# Patient Record
Sex: Male | Born: 1970
Health system: Southern US, Community
[De-identification: ages and names within clinical notes are randomized; demographics above are authoritative.]

## PROBLEM LIST (undated history)

## (undated) DIAGNOSIS — I10 Essential (primary) hypertension: Secondary | ICD-10-CM

## (undated) DIAGNOSIS — K219 Gastro-esophageal reflux disease without esophagitis: Secondary | ICD-10-CM

## (undated) DIAGNOSIS — F329 Major depressive disorder, single episode, unspecified: Secondary | ICD-10-CM

## (undated) DIAGNOSIS — Z87442 Personal history of urinary calculi: Secondary | ICD-10-CM

## (undated) DIAGNOSIS — F32A Depression, unspecified: Secondary | ICD-10-CM

## (undated) DIAGNOSIS — E559 Vitamin D deficiency, unspecified: Secondary | ICD-10-CM

## (undated) HISTORY — DX: Essential (primary) hypertension: I10

## (undated) HISTORY — DX: Depression, unspecified: F32.A

## (undated) HISTORY — DX: Personal history of urinary calculi: Z87.442

## (undated) HISTORY — DX: Vitamin D deficiency, unspecified: E55.9

---
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
## 2010-09-10 ENCOUNTER — Encounter: Payer: Self-pay | Admitting: Nurse Practitioner

## 2013-03-27 ENCOUNTER — Encounter (INDEPENDENT_AMBULATORY_CARE_PROVIDER_SITE_OTHER): Payer: Self-pay

## 2013-03-27 ENCOUNTER — Encounter: Payer: Self-pay | Admitting: Family Medicine

## 2013-03-27 ENCOUNTER — Ambulatory Visit (INDEPENDENT_AMBULATORY_CARE_PROVIDER_SITE_OTHER): Payer: PRIVATE HEALTH INSURANCE | Admitting: Family Medicine

## 2013-03-27 VITALS — BP 138/81 | HR 79 | Temp 98.7°F | Ht 71.0 in | Wt 256.2 lb

## 2013-03-27 DIAGNOSIS — J029 Acute pharyngitis, unspecified: Secondary | ICD-10-CM

## 2013-03-27 DIAGNOSIS — R509 Fever, unspecified: Secondary | ICD-10-CM

## 2013-03-27 DIAGNOSIS — R05 Cough: Secondary | ICD-10-CM

## 2013-03-27 LAB — POCT INFLUENZA A/B
Influenza A, POC: NEGATIVE
Influenza B, POC: NEGATIVE

## 2013-03-27 LAB — POCT RAPID STREP A (OFFICE): Rapid Strep A Screen: NEGATIVE

## 2013-03-27 MED ORDER — AZITHROMYCIN 250 MG PO TABS: ORAL_TABLET | ORAL | Status: DC

## 2013-03-27 MED ORDER — METHYLPREDNISOLONE ACETATE 80 MG/ML IJ SUSP
80.0000 mg | Freq: Once | INTRAMUSCULAR | Status: AC
  Administered 2013-03-27: 80 mg via INTRAMUSCULAR

## 2013-03-27 NOTE — Progress Notes (Signed)
  Subjective:    Patient ID: Eric Weaver, male    DOB: 03/20/1971, 42 y.o.   MRN: [id number]  HPI This 42 y.o. male presents for evaluation of fever, cough, and uri sx's for over a week.   Review of Systems C/o uri, cough, and fatigue No chest pain, SOB, HA, dizziness, vision change, N/V, diarrhea, constipation, dysuria, urinary urgency or frequency, myalgias, arthralgias or rash.     Objective:   Physical Exam Vital signs noted  Well developed well nourished male.  HEENT - Head atraumatic Normocephalic                Eyes - PERRLA, Conjuctiva - clear Sclera- Clear EOMI                Ears - EAC's Wnl TM's Wnl Gross Hearing WNL                Nose - Nares patent                 Throat - oropharanx wnl Respiratory - Lungs CTA bilateral Cardiac - RRR S1 and S2 without murmur GI - Abdomen soft Nontender and bowel sounds active x 4 Extremities - No edema. Neuro - Grossly intact.  Results for orders placed in visit on 03/27/13  POCT RAPID STREP A (OFFICE)      Result Value Range   Rapid Strep A Screen Negative  Negative  POCT INFLUENZA A/B      Result Value Range   Influenza A, POC Negative     Influenza B, POC Negative         Assessment & Plan:  Cough - Plan: POCT Influenza A/B, azithromycin (ZITHROMAX) 250 MG tablet Depomedrol 80mg  IM given  Sore throat - Plan: POCT rapid strep A, azithromycin (ZITHROMAX) 250 MG tablet  Fever - Plan: POCT rapid strep A, POCT Influenza A/B, azithromycin (ZITHROMAX) 250 MG tablet  Deatra Canter FNP

## 2013-03-27 NOTE — Patient Instructions (Signed)
Sore Throat A sore throat is pain, burning, irritation, or scratchiness of the throat. There is often pain or tenderness when swallowing or talking. A sore throat may be accompanied by other symptoms, such as coughing, sneezing, fever, and swollen neck glands. A sore throat is often the first sign of another sickness, such as a cold, flu, strep throat, or mononucleosis (commonly known as mono). Most sore throats go away without medical treatment. CAUSES  The most common causes of a sore throat include:  A viral infection, such as a cold, flu, or mono.  A bacterial infection, such as strep throat, tonsillitis, or whooping cough.  Seasonal allergies.  Dryness in the air.  Irritants, such as smoke or pollution.  Gastroesophageal reflux disease (GERD). HOME CARE INSTRUCTIONS   Only take over-the-counter medicines as directed by your caregiver.  Drink enough fluids to keep your urine clear or pale yellow.  Rest as needed.  Try using throat sprays, lozenges, or sucking on hard candy to ease any pain (if older than 4 years or as directed).  Sip warm liquids, such as broth, herbal tea, or warm water with honey to relieve pain temporarily. You may also eat or drink cold or frozen liquids such as frozen ice pops.  Gargle with salt water (mix 1 tsp salt with 8 oz of water).  Do not smoke and avoid secondhand smoke.  Put a cool-mist humidifier in your bedroom at night to moisten the air. You can also turn on a hot shower and sit in the bathroom with the door closed for 5 10 minutes. SEEK IMMEDIATE MEDICAL CARE IF:  You have difficulty breathing.  You are unable to swallow fluids, soft foods, or your saliva.  You have increased swelling in the throat.  Your sore throat does not get better in 7 days.  You have nausea and vomiting.  You have a fever or persistent symptoms for more than 2 3 days.  You have a fever and your symptoms suddenly get worse. MAKE SURE YOU:   Understand  these instructions.  Will watch your condition.  Will get help right away if you are not doing well or get worse. Document Released: 06/25/2004 Document Revised: 05/04/2012 Document Reviewed: 01/24/2012 [employer] Patient Information 2014 [employer].  

## 2013-04-06 ENCOUNTER — Other Ambulatory Visit: Payer: Self-pay

## 2013-05-10 ENCOUNTER — Encounter: Payer: Self-pay | Admitting: Family Medicine

## 2013-05-10 ENCOUNTER — Ambulatory Visit (INDEPENDENT_AMBULATORY_CARE_PROVIDER_SITE_OTHER): Payer: PRIVATE HEALTH INSURANCE | Admitting: Family Medicine

## 2013-05-10 ENCOUNTER — Telehealth: Payer: Self-pay | Admitting: Family Medicine

## 2013-05-10 VITALS — BP 126/73 | HR 66 | Temp 98.0°F | Ht 72.0 in | Wt 259.4 lb

## 2013-05-10 DIAGNOSIS — F411 Generalized anxiety disorder: Secondary | ICD-10-CM

## 2013-05-10 DIAGNOSIS — N529 Male erectile dysfunction, unspecified: Secondary | ICD-10-CM

## 2013-05-10 MED ORDER — BUPROPION HCL ER (XL) 150 MG PO TB24: 150.0000 mg | ORAL_TABLET | Freq: Every day | ORAL | Status: DC

## 2013-05-10 MED ORDER — SILDENAFIL CITRATE 20 MG PO TABS: ORAL_TABLET | ORAL | Status: DC

## 2013-05-10 MED ORDER — CLONAZEPAM 0.5 MG PO TABS: 0.5000 mg | ORAL_TABLET | Freq: Two times a day (BID) | ORAL | Status: DC | PRN

## 2013-05-10 NOTE — Progress Notes (Signed)
   Subjective:    Patient ID: Eric Weaver, male    DOB: 1971/03/04, 42 y.o.   MRN: [id number]  HPI  This 42 y.o. male presents for evaluation of depression and anxiety.  He is having some  Ed problems as well.  Review of Systems C/o depression, ED No chest pain, SOB, HA, dizziness, vision change, N/V, diarrhea, constipation, dysuria, urinary urgency or frequency, myalgias, arthralgias or rash.     Objective:   Physical Exam  Vital signs noted  Well developed well nourished male.  HEENT - Head atraumatic Normocephalic                Eyes - PERRLA, Conjuctiva - clear Sclera- Clear EOMI                Ears - EAC's Wnl TM's Wnl Gross Hearing WNL                 Throat - oropharanx wnl Respiratory - Lungs CTA bilateral Cardiac - RRR S1 and S2 without murmur GI - Abdomen soft Nontender and bowel sounds active x 4 Extremities - No edema. Neuro - Grossly intact.      Assessment & Plan:  Erectile dysfunction - Plan: sildenafil (REVATIO) 20 MG tablet 2 to 5 po qd prn sex #90w/3rf  Depression - Wellbutrin XL 150mg  one po qd #30w/11 Clonazepam 0.5mg  one po tid prn #30w/1 rf  Follow up in one month.  Deatra Canter FNP

## 2013-05-12 ENCOUNTER — Other Ambulatory Visit: Payer: Self-pay | Admitting: Family Medicine

## 2013-05-22 NOTE — Telephone Encounter (Signed)
Can you address this since Annette Stable is on vacation.

## 2013-05-26 MED ORDER — REVATIO 20 MG PO TABS: 20.0000 mg | ORAL_TABLET | Freq: Three times a day (TID) | ORAL | Status: DC

## 2013-05-30 ENCOUNTER — Other Ambulatory Visit: Payer: Self-pay | Admitting: Family Medicine

## 2013-05-30 MED ORDER — REVATIO 20 MG PO TABS: 20.0000 mg | ORAL_TABLET | Freq: Three times a day (TID) | ORAL | Status: DC

## 2013-06-12 ENCOUNTER — Encounter: Payer: Self-pay | Admitting: Family Medicine

## 2013-06-12 ENCOUNTER — Ambulatory Visit (INDEPENDENT_AMBULATORY_CARE_PROVIDER_SITE_OTHER): Payer: 59 | Admitting: Family Medicine

## 2013-06-12 VITALS — BP 123/75 | HR 62 | Temp 98.5°F | Ht 72.0 in | Wt 258.4 lb

## 2013-06-12 DIAGNOSIS — F3289 Other specified depressive episodes: Secondary | ICD-10-CM

## 2013-06-12 DIAGNOSIS — F32A Depression, unspecified: Secondary | ICD-10-CM

## 2013-06-12 DIAGNOSIS — F329 Major depressive disorder, single episode, unspecified: Secondary | ICD-10-CM

## 2013-06-12 DIAGNOSIS — N529 Male erectile dysfunction, unspecified: Secondary | ICD-10-CM

## 2013-06-12 MED ORDER — CLONAZEPAM 1 MG PO TABS: 1.0000 mg | ORAL_TABLET | Freq: Two times a day (BID) | ORAL | Status: DC | PRN

## 2013-06-12 MED ORDER — SILDENAFIL CITRATE 20 MG PO TABS: ORAL_TABLET | ORAL | Status: DC

## 2013-06-12 NOTE — Progress Notes (Signed)
   Subjective:    Patient ID: Eric Weaver, male    DOB: 1971-02-09, 43 y.o.   MRN: [id number]  HPI  This 43 y.o. male presents for evaluation of follow up on anxiety and depression.  He has  Started wellbutrin and takes clonazepam for anxiety.  He states he has to double up on the Clonazepam.  He needs refills on sildenafil 20mg  po tid for ED.  He states he has new [employer] now and it can be done with prior authorization. He has intolerance to regular viagra and cialis.  Review of Systems No chest pain, SOB, HA, dizziness, vision change, N/V, diarrhea, constipation, dysuria, urinary urgency or frequency, myalgias, arthralgias or rash.     Objective:   Physical Exam Vital signs noted  Well developed well nourished male.  HEENT - Head atraumatic Normocephalic                Eyes - PERRLA, Conjuctiva - clear Sclera- Clear EOMI                Ears - EAC's Wnl TM's Wnl Gross Hearing WNL                Nose - Nares patent                 Throat - oropharanx wnl Respiratory - Lungs CTA bilateral Cardiac - RRR S1 and S2 without murmur GI - Abdomen soft Nontender and bowel sounds active x 4 Extremities - No edema. Neuro - Grossly intact.       Assessment & Plan:  Erectile dysfunction - Plan: sildenafil (REVATIO) 20 MG tablet po tid #90  Depression - Doing better on wellbutrin and clonazepam.  Follow up in 4 months  Lysbeth Penner FNP

## 2013-06-12 NOTE — Patient Instructions (Signed)

## 2013-07-03 ENCOUNTER — Telehealth: Payer: Self-pay | Admitting: *Deleted

## 2013-07-03 NOTE — Telephone Encounter (Signed)
Ins co has denied sildenafil citrate because it is being prescribed to Impotence of organic origin and depressive disorder nec neither of which is covered.

## 2013-07-12 ENCOUNTER — Other Ambulatory Visit: Payer: Self-pay | Admitting: *Deleted

## 2013-07-12 MED ORDER — CLONAZEPAM 1 MG PO TABS: 1.0000 mg | ORAL_TABLET | Freq: Two times a day (BID) | ORAL | Status: DC | PRN

## 2013-07-12 NOTE — Telephone Encounter (Signed)
This was refilled last month. Patient has decided to change to mailorder. Please advise if ok to fill this again

## 2013-09-05 ENCOUNTER — Encounter: Payer: Self-pay | Admitting: *Deleted

## 2013-09-19 ENCOUNTER — Encounter: Payer: Self-pay | Admitting: *Deleted

## 2013-10-11 ENCOUNTER — Ambulatory Visit: Payer: 59 | Admitting: Family Medicine

## 2013-10-12 ENCOUNTER — Ambulatory Visit: Payer: 59 | Admitting: Family Medicine

## 2014-08-14 ENCOUNTER — Encounter: Payer: Self-pay | Admitting: Family

## 2014-08-14 ENCOUNTER — Ambulatory Visit (INDEPENDENT_AMBULATORY_CARE_PROVIDER_SITE_OTHER): Payer: 59 | Admitting: Family

## 2014-08-14 VITALS — BP 138/88 | HR 77 | Temp 98.9°F | Ht 72.0 in | Wt 273.0 lb

## 2014-08-14 DIAGNOSIS — J019 Acute sinusitis, unspecified: Secondary | ICD-10-CM

## 2014-08-14 DIAGNOSIS — T148 Other injury of unspecified body region: Secondary | ICD-10-CM | POA: Diagnosis not present

## 2014-08-14 DIAGNOSIS — W57XXXA Bitten or stung by nonvenomous insect and other nonvenomous arthropods, initial encounter: Secondary | ICD-10-CM | POA: Diagnosis not present

## 2014-08-14 MED ORDER — AMOXICILLIN-POT CLAVULANATE 875-125 MG PO TABS: 1.0000 | ORAL_TABLET | Freq: Two times a day (BID) | ORAL | Status: DC

## 2014-08-14 NOTE — Patient Instructions (Addendum)
Sinusitis Sinusitis is redness, soreness, and inflammation of the paranasal sinuses. Paranasal sinuses are air pockets within the bones of your face (beneath the eyes, the middle of the forehead, or above the eyes). In healthy paranasal sinuses, mucus is able to drain out, and air is able to circulate through them by way of your nose. However, when your paranasal sinuses are inflamed, mucus and air can become trapped. This can allow bacteria and other germs to grow and cause infection. Sinusitis can develop quickly and last only a short time (acute) or continue over a long period (chronic). Sinusitis that lasts for more than 12 weeks is considered chronic.  CAUSES  Causes of sinusitis include:  Allergies.  Structural abnormalities, such as displacement of the cartilage that separates your nostrils (deviated septum), which can decrease the air flow through your nose and sinuses and affect sinus drainage.  Functional abnormalities, such as when the small hairs (cilia) that line your sinuses and help remove mucus do not work properly or are not present. SIGNS AND SYMPTOMS  Symptoms of acute and chronic sinusitis are the same. The primary symptoms are pain and pressure around the affected sinuses. Other symptoms include:  Upper toothache.  Earache.  Headache.  Bad breath.  Decreased sense of smell and taste.  A cough, which worsens when you are lying flat.  Fatigue.  Fever.  Thick drainage from your nose, which often is green and may contain pus (purulent).  Swelling and warmth over the affected sinuses. DIAGNOSIS  Your health care provider will perform a physical exam. During the exam, your health care provider may:  Look in your nose for signs of abnormal growths in your nostrils (nasal polyps).  Tap over the affected sinus to check for signs of infection.  View the inside of your sinuses (endoscopy) using an imaging device that has a light attached (endoscope). If your health  care provider suspects that you have chronic sinusitis, one or more of the following tests may be recommended:  Allergy tests.  Nasal culture. A sample of mucus is taken from your nose, sent to a lab, and screened for bacteria.  Nasal cytology. A sample of mucus is taken from your nose and examined by your health care provider to determine if your sinusitis is related to an allergy. TREATMENT  Most cases of acute sinusitis are related to a viral infection and will resolve on their own within 10 days. Sometimes medicines are prescribed to help relieve symptoms (pain medicine, decongestants, nasal steroid sprays, or saline sprays).  However, for sinusitis related to a bacterial infection, your health care provider will prescribe antibiotic medicines. These are medicines that will help kill the bacteria causing the infection.  Rarely, sinusitis is caused by a fungal infection. In theses cases, your health care provider will prescribe antifungal medicine. For some cases of chronic sinusitis, surgery is needed. Generally, these are cases in which sinusitis recurs more than 3 times per year, despite other treatments. HOME CARE INSTRUCTIONS   Drink plenty of water. Water helps thin the mucus so your sinuses can drain more easily.  Use a humidifier.  Inhale steam 3 to 4 times a day (for example, sit in the bathroom with the shower running).  Apply a warm, moist washcloth to your face 3 to 4 times a day, or as directed by your health care provider.  Use saline nasal sprays to help moisten and clean your sinuses.  Take medicines only as directed by your health care provider.    If you were prescribed either an antibiotic or antifungal medicine, finish it all even if you start to feel better. SEEK IMMEDIATE MEDICAL CARE IF:  You have increasing pain or severe headaches.  You have nausea, vomiting, or drowsiness.  You have swelling around your face.  You have vision problems.  You have a stiff  neck.  You have difficulty breathing. MAKE SURE YOU:   Understand these instructions.  Will watch your condition.  Will get help right away if you are not doing well or get worse. Document Released: 05/18/2005 Document Revised: 10/02/2013 Document Reviewed: 06/02/2011 [employer] Patient Information 2015 [employer]. This information is not intended to replace advice given to you by your health care provider. Make sure you discuss any questions you have with your health care provider.  - Take meds as prescribed - Use a cool mist humidifier  -Use saline nose sprays frequently -Saline irrigations of the nose can be very helpful if done frequently.  * 4X daily for 1 week*  * Use of a nettie pot can be helpful with this. Follow directions with this* -Force fluids -For any cough or congestion  Use plain Mucinex- regular strength or max strength is fine   * Children- consult with Pharmacist for dosing -For fever or aces or pains- take tylenol or ibuprofen appropriate for age and weight.  * for fevers greater than 101 orally you may alternate ibuprofen and tylenol every  3 hours. -Throat lozenges if help   Ziyanna Tolin, FNP  

## 2014-08-14 NOTE — Progress Notes (Signed)
   Subjective:    Patient ID: Eric Weaver, male    DOB: [birth year], 44 y.o.   MRN: [id number]  Sinusitis This is a new problem. The current episode started 1 to 4 weeks ago. The problem has been waxing and waning since onset. There has been no fever. His pain is at a severity of 6/10. The pain is moderate. Associated symptoms include chills, congestion, coughing, headaches, shortness of breath, sinus pressure, sneezing, a sore throat and swollen glands. Pertinent negatives include no ear pain. Past treatments include acetaminophen and oral decongestants. The treatment provided mild relief.      Review of Systems  Constitutional: Positive for chills.  HENT: Positive for congestion, sinus pressure, sneezing and sore throat. Negative for ear pain.   Respiratory: Positive for cough and shortness of breath.   Cardiovascular: Negative.   Gastrointestinal: Negative.   Endocrine: Negative.   Genitourinary: Negative.   Musculoskeletal: Negative.   Neurological: Positive for headaches.  Hematological: Negative.   Psychiatric/Behavioral: Negative.   All other systems reviewed and are negative.      Objective:   Physical Exam  Constitutional: He is oriented to person, place, and time. He appears well-developed and well-nourished. No distress.  HENT:  Head: Normocephalic.  Right Ear: External ear normal.  Left Ear: External ear normal.  Nasal passage erythemas with mild swelling  Oropharynx erythemas  Eyes: Pupils are equal, round, and reactive to light. Right eye exhibits no discharge. Left eye exhibits no discharge.  Neck: Normal range of motion. Neck supple. No thyromegaly present.  Cardiovascular: Normal rate, regular rhythm, normal heart sounds and intact distal pulses.   No murmur heard. Pulmonary/Chest: Effort normal and breath sounds normal. No respiratory distress. He has no wheezes.  Abdominal: Soft. Bowel sounds are normal. He exhibits no distension. There is no tenderness.    Musculoskeletal: Normal range of motion. He exhibits no edema or tenderness.  Neurological: He is alert and oriented to person, place, and time. He has normal reflexes. No cranial nerve deficit.  Skin: Skin is warm and dry. No rash noted. No erythema.  Psychiatric: He has a normal mood and affect. His behavior is normal. Judgment and thought content normal.  Vitals reviewed.     BP 138/88 mmHg  Pulse 77  Temp(Src) 98.9 F (37.2 C) (Oral)  Ht 6' (1.829 m)  Wt 273 lb (123.832 kg)  BMI 37.02 kg/m2     Assessment & Plan:  1. Acute sinusitis, recurrence not specified, unspecified location -- Take meds as prescribed - Use a cool mist humidifier  -Use saline nose sprays frequently Daily Claritin  -Saline irrigations of the nose can be very helpful if done frequently.  * 4X daily for 1 week*  * Use of a nettie pot can be helpful with this. Follow directions with this* -Force fluids -For any cough or congestion  Use plain Mucinex- regular strength or max strength is fine   * Children- consult with Pharmacist for dosing -For fever or aces or pains- take tylenol or ibuprofen appropriate for age and weight.  * for fevers greater than 101 orally you may alternate ibuprofen and tylenol every  3 hours. -Throat lozenges if help - amoxicillin-clavulanate (AUGMENTIN) 875-125 MG per tablet; Take 1 tablet by mouth 2 (two) times daily.  Dispense: 14 tablet; Refill: 0  2. Tick bite - Lyme Ab/Western Blot Reflex  Evelina Dun, FNP

## 2014-08-15 LAB — LYME AB/WESTERN BLOT REFLEX: Lyme IgG/IgM Ab: <0.91 {ISR} (ref 0.00–0.90)

## 2014-09-05 ENCOUNTER — Other Ambulatory Visit: Payer: Self-pay | Admitting: Family

## 2014-09-05 DIAGNOSIS — J019 Acute sinusitis, unspecified: Secondary | ICD-10-CM

## 2014-09-07 ENCOUNTER — Telehealth: Payer: 59 | Admitting: Nurse Practitioner

## 2014-09-07 DIAGNOSIS — J0100 Acute maxillary sinusitis, unspecified: Secondary | ICD-10-CM

## 2014-09-07 MED ORDER — AZITHROMYCIN 250 MG PO TABS: ORAL_TABLET | ORAL | Status: DC

## 2014-09-07 NOTE — Progress Notes (Signed)

## 2016-03-27 ENCOUNTER — Other Ambulatory Visit: Payer: Self-pay | Admitting: Family

## 2016-03-27 DIAGNOSIS — J019 Acute sinusitis, unspecified: Secondary | ICD-10-CM

## 2016-03-27 MED ORDER — AMOXICILLIN-POT CLAVULANATE 875-125 MG PO TABS: 1.0000 | ORAL_TABLET | Freq: Two times a day (BID) | ORAL | 0 refills | Status: DC

## 2016-03-27 NOTE — Telephone Encounter (Signed)
[facility] for refills on antibiotics

## 2017-02-11 DIAGNOSIS — K219 Gastro-esophageal reflux disease without esophagitis: Secondary | ICD-10-CM | POA: Insufficient documentation

## 2017-08-08 ENCOUNTER — Telehealth: Payer: Self-pay | Admitting: Physician Assistant

## 2017-08-08 DIAGNOSIS — B9789 Other viral agents as the cause of diseases classified elsewhere: Secondary | ICD-10-CM

## 2017-08-08 DIAGNOSIS — J329 Chronic sinusitis, unspecified: Secondary | ICD-10-CM

## 2017-08-08 MED ORDER — FLUTICASONE PROPIONATE 50 MCG/ACT NA SUSP: 2.0000 | Freq: Every day | NASAL | 6 refills | Status: DC

## 2017-08-08 NOTE — Progress Notes (Signed)

## 2017-08-13 MED ORDER — AMOXICILLIN-POT CLAVULANATE 875-125 MG PO TABS: 1.0000 | ORAL_TABLET | Freq: Two times a day (BID) | ORAL | 0 refills | Status: DC

## 2017-08-13 NOTE — Addendum Note (Signed)
Addended by: Chevis Pretty on: 08/13/2017 08:01 PM   Modules accepted: Orders

## 2017-08-13 NOTE — Progress Notes (Signed)

## 2017-10-28 ENCOUNTER — Ambulatory Visit (INDEPENDENT_AMBULATORY_CARE_PROVIDER_SITE_OTHER): Payer: 59 | Admitting: Family

## 2017-10-28 ENCOUNTER — Encounter: Payer: Self-pay | Admitting: Family

## 2017-10-28 VITALS — BP 136/88 | HR 89 | Temp 97.7°F | Ht 72.0 in | Wt 292.6 lb

## 2017-10-28 DIAGNOSIS — S30861A Insect bite (nonvenomous) of abdominal wall, initial encounter: Secondary | ICD-10-CM

## 2017-10-28 DIAGNOSIS — W57XXXA Bitten or stung by nonvenomous insect and other nonvenomous arthropods, initial encounter: Secondary | ICD-10-CM | POA: Diagnosis not present

## 2017-10-28 DIAGNOSIS — R5383 Other fatigue: Secondary | ICD-10-CM

## 2017-10-28 DIAGNOSIS — M2550 Pain in unspecified joint: Secondary | ICD-10-CM | POA: Diagnosis not present

## 2017-10-28 NOTE — Progress Notes (Signed)
   Subjective:    Patient ID: Eric Weaver, male    DOB: [birth year], 47 y.o.   MRN: [id number]  Chief Complaint  Patient presents with  . jpoint pain    has had ticks removed  . Fever    HPI PT presents to the office today with joint pain, fever, fatigue, headache,  chills, and mild fever that started over the last three months, but has become worse. States several people in his family have tested positive for lyme or RMSF and would like to be tested.   He states he removed a tick on his left lower abdomen about a week ago, but has removed another tick on right waist line about 5 months ago.   Denies any rash.    Review of Systems  Constitutional: Positive for chills, fatigue and fever.  Neurological: Positive for headaches.  All other systems reviewed and are negative.      Objective:   Physical Exam  Constitutional: He is oriented to person, place, and time. He appears well-developed and well-nourished. No distress.  HENT:  Head: Normocephalic.  Right Ear: External ear normal.  Left Ear: External ear normal.  Nose: Mucosal edema and rhinorrhea present.  Mouth/Throat: Oropharynx is clear and moist.  Eyes: Pupils are equal, round, and reactive to light. Right eye exhibits no discharge. Left eye exhibits no discharge.  Neck: Normal range of motion. Neck supple. No thyromegaly present.  Cardiovascular: Normal rate, regular rhythm, normal heart sounds and intact distal pulses.  No murmur heard. Pulmonary/Chest: Effort normal and breath sounds normal. No respiratory distress. He has no wheezes.  Abdominal: Soft. Bowel sounds are normal. He exhibits no distension. There is no tenderness.  Musculoskeletal: Normal range of motion. He exhibits edema (trace BLE). He exhibits no tenderness.  Neurological: He is alert and oriented to person, place, and time. He has normal reflexes. No cranial nerve deficit.  Skin: Skin is warm and dry. No rash noted. No erythema.  Psychiatric: He  has a normal mood and affect. His behavior is normal. Judgment and thought content normal.  Vitals reviewed.     BP 136/88   Pulse 89   Temp 97.7 F (36.5 C) (Oral)   Ht 6' (1.829 m)   Wt 292 lb 9.6 oz (132.7 kg)   BMI 39.68 kg/m      Assessment & Plan:  Eric Weaver comes in today with chief complaint of jpoint pain (has had ticks removed) and Fever   Diagnosis and orders addressed:  1. Fatigue, unspecified type - Anemia Profile B - CMP14+EGFR - TSH - VITAMIN D 25 Hydroxy (Vit-D Deficiency, Fractures)  2. Tick bite of abdominal wall, initial encounter -Pt to report any new fever, joint pain, or rash -Wear protective clothing while outside- Long sleeves and long pants -Put insect repellent on all exposed skin and along clothing -Take a shower as soon as possible after being outside - CMP14+EGFR - Lyme Ab/Western Blot Reflex - [place] mtn spotted fvr abs pnl(IgG+IgM)  3. Arthralgia, unspecified joint - CMP14+EGFR   Labs pending Rest Force fluids RTO if symptoms worsen or do not improve   Evelina Dun, FNP

## 2017-10-28 NOTE — Patient Instructions (Signed)

## 2017-10-29 ENCOUNTER — Telehealth: Payer: Self-pay | Admitting: Family

## 2017-11-01 LAB — CMP14+EGFR
A/G RATIO: 1.5 (ref 1.2–2.2)
ALBUMIN: 4.6 g/dL (ref 3.5–5.5)
ALT: 61 IU/L — ABNORMAL HIGH (ref 0–44)
AST: 38 IU/L (ref 0–40)
Alkaline Phosphatase: 63 IU/L (ref 39–117)
BUN / CREAT RATIO: 14 (ref 9–20)
BUN: 10 mg/dL (ref 6–24)
Bilirubin Total: 0.3 mg/dL (ref 0.0–1.2)
CO2: 20 mmol/L (ref 20–29)
Calcium: 9.4 mg/dL (ref 8.7–10.2)
Chloride: 104 mmol/L (ref 96–106)
Creatinine, Ser: 0.72 mg/dL — ABNORMAL LOW (ref 0.76–1.27)
GFR calc non Af Amer: 111 mL/min/{1.73_m2} (ref >59)
GFR, EST AFRICAN AMERICAN: 128 mL/min/{1.73_m2} (ref >59)
Globulin, Total: 3 g/dL (ref 1.5–4.5)
Glucose: 120 mg/dL — ABNORMAL HIGH (ref 65–99)
POTASSIUM: 4 mmol/L (ref 3.5–5.2)
Sodium: 141 mmol/L (ref 134–144)
Total Protein: 7.6 g/dL (ref 6.0–8.5)

## 2017-11-01 LAB — ANEMIA PROFILE B
Basophils Absolute: 0 10*3/uL (ref 0.0–0.2)
Basos: 0 %
EOS (ABSOLUTE): 0.1 10*3/uL (ref 0.0–0.4)
Eos: 2 %
FERRITIN: 348 ng/mL (ref 30–400)
Folate: >20 ng/mL (ref >3.0)
Hematocrit: 43.4 % (ref 37.5–51.0)
Hemoglobin: 14.8 g/dL (ref 13.0–17.7)
Immature Grans (Abs): 0 10*3/uL (ref 0.0–0.1)
Immature Granulocytes: 0 %
Iron Saturation: 19 % (ref 15–55)
Iron: 59 ug/dL (ref 38–169)
Lymphocytes Absolute: 3.5 10*3/uL — ABNORMAL HIGH (ref 0.7–3.1)
Lymphs: 41 %
MCH: 31 pg (ref 26.6–33.0)
MCHC: 34.1 g/dL (ref 31.5–35.7)
MCV: 91 fL (ref 79–97)
MONOS ABS: 0.4 10*3/uL (ref 0.1–0.9)
Monocytes: 5 %
Neutrophils Absolute: 4.4 10*3/uL (ref 1.4–7.0)
Neutrophils: 52 %
Platelets: 298 10*3/uL (ref 150–450)
RBC: 4.78 x10E6/uL (ref 4.14–5.80)
RDW: 14.2 % (ref 12.3–15.4)
Retic Ct Pct: 3.7 % — ABNORMAL HIGH (ref 0.6–2.6)
Total Iron Binding Capacity: 310 ug/dL (ref 250–450)
UIBC: 251 ug/dL (ref 111–343)
Vitamin B-12: 373 pg/mL (ref 232–1245)
WBC: 8.5 10*3/uL (ref 3.4–10.8)

## 2017-11-01 LAB — ROCKY MTN SPOTTED FVR ABS PNL(IGG+IGM)
RMSF IgG: UNDETERMINED
RMSF IgM: 0.14 index (ref 0.00–0.89)

## 2017-11-01 LAB — TSH: TSH: 1.35 u[IU]/mL (ref 0.450–4.500)

## 2017-11-01 LAB — RMSF, IGG, IFA: RMSF, IGG, IFA: <1:64 {titer}

## 2017-11-01 LAB — LYME AB/WESTERN BLOT REFLEX: LYME DISEASE AB, QUANT, IGM: <0.8 index (ref 0.00–0.79)

## 2017-11-01 LAB — VITAMIN D 25 HYDROXY (VIT D DEFICIENCY, FRACTURES): VIT D 25 HYDROXY: 19.2 ng/mL — AB (ref 30.0–100.0)

## 2017-11-02 ENCOUNTER — Other Ambulatory Visit: Payer: Self-pay | Admitting: Family

## 2017-11-02 MED ORDER — VITAMIN D (ERGOCALCIFEROL) 1.25 MG (50000 UNIT) PO CAPS: 50000.0000 [IU] | ORAL_CAPSULE | ORAL | 3 refills | Status: DC

## 2017-11-02 MED ORDER — ESCITALOPRAM OXALATE 10 MG PO TABS: 10.0000 mg | ORAL_TABLET | Freq: Every day | ORAL | 3 refills | Status: DC

## 2017-11-02 NOTE — Telephone Encounter (Signed)
Lexapro Prescription sent to pharmacy. See result note

## 2017-11-04 LAB — HGB A1C W/O EAG: HEMOGLOBIN A1C: 5.3 % (ref 4.8–5.6)

## 2017-11-04 LAB — SPECIMEN STATUS REPORT

## 2017-12-16 ENCOUNTER — Ambulatory Visit (INDEPENDENT_AMBULATORY_CARE_PROVIDER_SITE_OTHER): Payer: 59 | Admitting: Family

## 2017-12-16 ENCOUNTER — Encounter: Payer: Self-pay | Admitting: Family

## 2017-12-16 DIAGNOSIS — F329 Major depressive disorder, single episode, unspecified: Secondary | ICD-10-CM | POA: Insufficient documentation

## 2017-12-16 DIAGNOSIS — F331 Major depressive disorder, recurrent, moderate: Secondary | ICD-10-CM

## 2017-12-16 DIAGNOSIS — F32A Depression, unspecified: Secondary | ICD-10-CM | POA: Insufficient documentation

## 2017-12-16 NOTE — Patient Instructions (Signed)
Living With Depression Everyone experiences occasional disappointment, sadness, and loss in their lives. When you are feeling down, blue, or sad for at least 2 weeks in a row, it may mean that you have depression. Depression can affect your thoughts and feelings, relationships, daily activities, and physical health. It is caused by changes in the way your brain functions. If you receive a diagnosis of depression, your health care provider will tell you which type of depression you have and what treatment options are available to you. If you are living with depression, there are ways to help you recover from it and also ways to prevent it from coming back. How to cope with lifestyle changes Coping with stress Stress is your body's reaction to life changes and events, both good and bad. Stressful situations may include:  Getting married.  The death of a spouse.  Losing a job.  Retiring.  Having a baby.  Stress can last just a few hours or it can be ongoing. Stress can play a major role in depression, so it is important to learn both how to cope with stress and how to think about it differently. Talk with your health care provider or a counselor if you would like to learn more about stress reduction. He or she may suggest some stress reduction techniques, such as:  Music therapy. This can include creating music or listening to music. Choose music that you enjoy and that inspires you.  Mindfulness-based meditation. This kind of meditation can be done while sitting or walking. It involves being aware of your normal breaths, rather than trying to control your breathing.  Centering prayer. This is a kind of meditation that involves focusing on a spiritual word or phrase. Choose a word, phrase, or sacred image that is meaningful to you and that brings you peace.  Deep breathing. To do this, expand your stomach and inhale slowly through your nose. Hold your breath for 3-5 seconds, then exhale  slowly, allowing your stomach muscles to relax.  Muscle relaxation. This involves intentionally tensing muscles then relaxing them.  Choose a stress reduction technique that fits your lifestyle and personality. Stress reduction techniques take time and practice to develop. Set aside 5-15 minutes a day to do them. Therapists can offer training in these techniques. The training may be covered by some insurance plans. Other things you can do to manage stress include:  Keeping a stress diary. This can help you learn what triggers your stress and ways to control your response.  Understanding what your limits are and saying no to requests or events that lead to a schedule that is too full.  Thinking about how you respond to certain situations. You may not be able to control everything, but you can control how you react.  Adding humor to your life by watching funny films or TV shows.  Making time for activities that help you relax and not feeling guilty about spending your time this way.  Medicines Your health care provider may suggest certain medicines if he or she feels that they will help improve your condition. Avoid using alcohol and other substances that may prevent your medicines from working properly (may interact). It is also important to:  Talk with your pharmacist or health care provider about all the medicines that you take, their possible side effects, and what medicines are safe to take together.  Make it your goal to take part in all treatment decisions (shared decision-making). This includes giving input on the side   effects of medicines. It is best if shared decision-making with your health care provider is part of your total treatment plan.  If your health care provider prescribes a medicine, you may not notice the full benefits of it for 4-8 weeks. Most people who are treated for depression need to be on medicine for at least 6-12 months after they feel better. If you are taking  medicines as part of your treatment, do not stop taking medicines without first talking to your health care provider. You may need to have the medicine slowly decreased (tapered) over time to decrease the risk of harmful side effects. Relationships Your health care provider may suggest family therapy along with individual therapy and drug therapy. While there may not be family problems that are causing you to feel depressed, it is still important to make sure your family learns as much as they can about your mental health. Having your family's support can help make your treatment successful. How to recognize changes in your condition Everyone has a different response to treatment for depression. Recovery from major depression happens when you have not had signs of major depression for two months. This may mean that you will start to:  Have more interest in doing activities.  Feel less hopeless than you did 2 months ago.  Have more energy.  Overeat less often, or have better or improving appetite.  Have better concentration.  Your health care provider will work with you to decide the next steps in your recovery. It is also important to recognize when your condition is getting worse. Watch for these signs:  Having fatigue or low energy.  Eating too much or too little.  Sleeping too much or too little.  Feeling restless, agitated, or hopeless.  Having trouble concentrating or making decisions.  Having unexplained physical complaints.  Feeling irritable, angry, or aggressive.  Get help as soon as you or your family members notice these symptoms coming back. How to get support and help from others How to talk with friends and family members about your condition Talking to friends and family members about your condition can provide you with one way to get support and guidance. Reach out to trusted friends or family members, explain your symptoms to them, and let them know that you are  working with a health care provider to treat your depression. Financial resources Not all insurance plans cover mental health care, so it is important to check with your insurance carrier. If paying for co-pays or counseling services is a problem, search for a local or county mental health care center. They may be able to offer public mental health care services at low or no cost when you are not able to see a private health care provider. If you are taking medicine for depression, you may be able to get the generic form, which may be less expensive. Some makers of prescription medicines also offer help to patients who cannot afford the medicines they need. Follow these instructions at home:  Get the right amount and quality of sleep.  Cut down on using caffeine, tobacco, alcohol, and other potentially harmful substances.  Try to exercise, such as walking or lifting small weights.  Take over-the-counter and prescription medicines only as told by your health care provider.  Eat a healthy diet that includes plenty of vegetables, fruits, whole grains, low-fat dairy products, and lean protein. Do not eat a lot of foods that are high in solid fats, added sugars, or salt.    Keep all follow-up visits as told by your health care provider. This is important. Contact a health care provider if:  You stop taking your antidepressant medicines, and you have any of these symptoms: ? Nausea. ? Headache. ? Feeling lightheaded. ? Chills and body aches. ? Not being able to sleep (insomnia).  You or your friends and family think your depression is getting worse. Get help right away if:  You have thoughts of hurting yourself or others. If you ever feel like you may hurt yourself or others, or have thoughts about taking your own life, get help right away. You can go to your nearest emergency department or call:  Your local emergency services (911 in the [place]  A suicide crisis helpline, such as the  National Suicide Prevention Lifeline at 1-800-273-8255. This is open 24-hours a day.  Summary  If you are living with depression, there are ways to help you recover from it and also ways to prevent it from coming back.  Work with your health care team to create a management plan that includes counseling, stress management techniques, and healthy lifestyle habits. This information is not intended to replace advice given to you by your health care provider. Make sure you discuss any questions you have with your health care provider. Document Released: 04/20/2016 Document Revised: 04/20/2016 Document Reviewed: 04/20/2016 Elsevier Interactive Patient Education  2018 [employer]  

## 2017-12-16 NOTE — Progress Notes (Signed)
   Subjective:    Patient ID: Eric Weaver, male    DOB: January 26, 1971, 47 y.o.   MRN: [id number]  Chief Complaint  Patient presents with  . follow up on fatigue    Depression         This is a new problem.  The current episode started more than 1 month ago.   The onset quality is gradual.   The problem occurs intermittently.  The problem has been waxing and waning since onset.  Associated symptoms include irritable, restlessness, decreased interest and sad.  Associated symptoms include no helplessness and no hopelessness.  Past treatments include SSRIs - Selective serotonin reuptake inhibitors.  Compliance with treatment is good.     Review of Systems  Psychiatric/Behavioral: Positive for depression.  All other systems reviewed and are negative.      Objective:   Physical Exam  Constitutional: He is oriented to person, place, and time. He appears well-developed and well-nourished. He is irritable. No distress.  HENT:  Head: Normocephalic.  Right Ear: External ear normal.  Left Ear: External ear normal.  Mouth/Throat: Oropharynx is clear and moist.  Eyes: Pupils are equal, round, and reactive to light. Right eye exhibits no discharge. Left eye exhibits no discharge.  Neck: Normal range of motion. Neck supple. No thyromegaly present.  Cardiovascular: Normal rate, regular rhythm, normal heart sounds and intact distal pulses.  No murmur heard. Pulmonary/Chest: Effort normal and breath sounds normal. No respiratory distress. He has no wheezes.  Abdominal: Soft. Bowel sounds are normal. He exhibits no distension. There is no tenderness.  Musculoskeletal: Normal range of motion. He exhibits no edema or tenderness.  Neurological: He is alert and oriented to person, place, and time. He has normal reflexes. No cranial nerve deficit.  Skin: Skin is warm and dry. No rash noted. No erythema.  Psychiatric: He has a normal mood and affect. His behavior is normal. Judgment and thought content  normal.  Vitals reviewed.   BP 132/88   Pulse 78   Temp 97.9 F (36.6 C) (Oral)   Ht 6' (1.829 m)   Wt 284 lb 6.4 oz (129 kg)   BMI 38.57 kg/m      Assessment & Plan:  Nihal was seen today for follow up on fatigue.  Diagnoses and all orders for this visit:  Moderate episode of recurrent major depressive disorder ([place])   Will restart Lexapro 10 mg every night  If it continues to make him "foggy" he will call me and we will change to a different medication  Stress management discussed RTO in 6 weeks   Evelina Dun, FNP

## 2018-07-09 ENCOUNTER — Emergency Department (HOSPITAL_COMMUNITY)
Admission: EM | Admit: 2018-07-09 | Discharge: 2018-07-09 | Disposition: A | Discharge status: Home / Self Care | Payer: 59 | Attending: Emergency Medicine | Admitting: Emergency Medicine

## 2018-07-09 ENCOUNTER — Encounter (HOSPITAL_COMMUNITY): Payer: Self-pay

## 2018-07-09 ENCOUNTER — Emergency Department (HOSPITAL_COMMUNITY): Payer: 59

## 2018-07-09 ENCOUNTER — Other Ambulatory Visit: Payer: Self-pay

## 2018-07-09 DIAGNOSIS — N200 Calculus of kidney: Secondary | ICD-10-CM | POA: Insufficient documentation

## 2018-07-09 DIAGNOSIS — Z79899 Other long term (current) drug therapy: Secondary | ICD-10-CM | POA: Insufficient documentation

## 2018-07-09 DIAGNOSIS — F329 Major depressive disorder, single episode, unspecified: Secondary | ICD-10-CM | POA: Diagnosis not present

## 2018-07-09 DIAGNOSIS — R1030 Lower abdominal pain, unspecified: Secondary | ICD-10-CM | POA: Diagnosis present

## 2018-07-09 HISTORY — DX: Gastro-esophageal reflux disease without esophagitis: K21.9

## 2018-07-09 LAB — CBC
HCT: 43.1 % (ref 39.0–52.0)
HEMOGLOBIN: 14.1 g/dL (ref 13.0–17.0)
MCH: 29.2 pg (ref 26.0–34.0)
MCHC: 32.7 g/dL (ref 30.0–36.0)
MCV: 89.2 fL (ref 80.0–100.0)
Platelets: 350 10*3/uL (ref 150–400)
RBC: 4.83 MIL/uL (ref 4.22–5.81)
RDW: 12.6 % (ref 11.5–15.5)
WBC: 17 10*3/uL — ABNORMAL HIGH (ref 4.0–10.5)
nRBC: 0 % (ref 0.0–0.2)

## 2018-07-09 LAB — BASIC METABOLIC PANEL
Anion gap: 11 (ref 5–15)
BUN: 19 mg/dL (ref 6–20)
CO2: 22 mmol/L (ref 22–32)
Calcium: 9.4 mg/dL (ref 8.9–10.3)
Chloride: 105 mmol/L (ref 98–111)
Creatinine, Ser: 1.37 mg/dL — ABNORMAL HIGH (ref 0.61–1.24)
GFR calc Af Amer: >60 mL/min (ref >60)
GFR calc non Af Amer: >60 mL/min (ref >60)
Glucose, Bld: 163 mg/dL — ABNORMAL HIGH (ref 70–99)
Potassium: 4.3 mmol/L (ref 3.5–5.1)
Sodium: 138 mmol/L (ref 135–145)

## 2018-07-09 LAB — URINALYSIS, ROUTINE W REFLEX MICROSCOPIC
Bilirubin Urine: NEGATIVE
Glucose, UA: NEGATIVE mg/dL
Leukocytes, UA: NEGATIVE
Nitrite: NEGATIVE
PH: 5.5 (ref 5.0–8.0)
Specific Gravity, Urine: >1.03 — ABNORMAL HIGH (ref 1.005–1.030)

## 2018-07-09 LAB — URINALYSIS, MICROSCOPIC (REFLEX)

## 2018-07-09 MED ORDER — ONDANSETRON 4 MG PO TBDP: ORAL_TABLET | ORAL | 0 refills | Status: DC

## 2018-07-09 MED ORDER — KETOROLAC TROMETHAMINE 30 MG/ML IJ SOLN
30.0000 mg | Freq: Once | INTRAMUSCULAR | Status: AC
  Administered 2018-07-09: 30 mg via INTRAVENOUS
  Filled 2018-07-09: qty 1

## 2018-07-09 MED ORDER — OXYCODONE-ACETAMINOPHEN 5-325 MG PO TABS: 1.0000 | ORAL_TABLET | Freq: Four times a day (QID) | ORAL | 0 refills | Status: DC | PRN

## 2018-07-09 MED ORDER — HYDROMORPHONE HCL 1 MG/ML IJ SOLN
0.5000 mg | Freq: Once | INTRAMUSCULAR | Status: AC
  Administered 2018-07-09: 0.5 mg via INTRAVENOUS
  Filled 2018-07-09: qty 1

## 2018-07-09 MED ORDER — TAMSULOSIN HCL 0.4 MG PO CAPS: 0.4000 mg | ORAL_CAPSULE | Freq: Every day | ORAL | 0 refills | Status: DC

## 2018-07-09 MED ORDER — ONDANSETRON HCL 4 MG/2ML IJ SOLN
4.0000 mg | Freq: Once | INTRAMUSCULAR | Status: AC
  Administered 2018-07-09: 4 mg via INTRAVENOUS
  Filled 2018-07-09: qty 2

## 2018-07-09 NOTE — ED Triage Notes (Signed)
Pt reports sharp pain right flank area APPROX 10 AM. Pt reports that pain has dulled now . Reports vomiting due to pain

## 2018-07-09 NOTE — Discharge Instructions (Signed)
Follow-up with your urologist next week °

## 2018-07-09 NOTE — ED Provider Notes (Signed)
[facility] EMERGENCY DEPARTMENT Provider Note   CSN: [id number] Arrival date & time: 07/09/18  1406     History   Chief Complaint Chief Complaint  Patient presents with  . Flank Pain    HPI Eric Weaver is a 48 y.o. male.  Patient complains of right flank pain with nausea.  The history is provided by the patient. No language interpreter was used.  Flank Pain  This is a new problem. The current episode started 12 to 24 hours ago. The problem occurs constantly. The problem has not changed since onset.Pertinent negatives include no chest pain, no abdominal pain and no headaches. Nothing aggravates the symptoms. Nothing relieves the symptoms. He has tried nothing for the symptoms. The treatment provided no relief.    Past Medical History:  Diagnosis Date  . GERD (gastroesophageal reflux disease)     Patient Active Problem List   Diagnosis Date Noted  . Depression 12/16/2017    History reviewed. No pertinent surgical history.      Home Medications    Prior to Admission medications   Medication Sig Start Date End Date Taking? Authorizing Provider  escitalopram (LEXAPRO) 10 MG tablet Take 1 tablet (10 mg total) by mouth daily. 11/02/17   Evelina Dun A, FNP  esomeprazole (NEXIUM) 20 MG capsule Take by mouth daily. 11/15/17   [provider]  ondansetron (ZOFRAN ODT) 4 MG disintegrating tablet 4mg  ODT q4 hours prn nausea/vomit 07/09/18   Milton Ferguson, MD  oxyCODONE-acetaminophen (PERCOCET/ROXICET) 5-325 MG tablet Take 1 tablet by mouth every 6 (six) hours as needed. 07/09/18   Milton Ferguson, MD  sildenafil (REVATIO) 20 MG tablet Take 2-5 tablets one hour prior to sex 06/12/13   Lysbeth Penner, FNP  tamsulosin (FLOMAX) 0.4 MG CAPS capsule Take 1 capsule (0.4 mg total) by mouth daily. 07/09/18   Milton Ferguson, MD  Vitamin D, Ergocalciferol, (DRISDOL) 50000 units CAPS capsule Take 1 capsule (50,000 Units total) by mouth every 7 (seven) days. 11/02/17   Sharion Balloon, FNP    Family History Family History  Problem Relation Age of Onset  . Hypertension Mother     Social History Social History   Tobacco Use  . Smoking status: Never Smoker  . Smokeless tobacco: Never Used  Substance Use Topics  . Alcohol use: Yes    Comment: rare  . Drug use: No     Allergies   Patient has no known allergies.   Review of Systems Review of Systems  Constitutional: Negative for appetite change and fatigue.  HENT: Negative for congestion, ear discharge and sinus pressure.   Eyes: Negative for discharge.  Respiratory: Negative for cough.   Cardiovascular: Negative for chest pain.  Gastrointestinal: Positive for nausea. Negative for abdominal pain and diarrhea.  Genitourinary: Positive for flank pain. Negative for frequency and hematuria.  Musculoskeletal: Negative for back pain.  Skin: Negative for rash.  Neurological: Negative for seizures and headaches.  Psychiatric/Behavioral: Negative for hallucinations.     Physical Exam Updated Vital Signs BP 134/78 (BP Location: Left Arm)   Pulse 76   Temp 98.7 F (37.1 C) (Temporal)   Resp 15   Ht 6' (1.829 m)   Wt 122.5 kg   SpO2 96%   BMI 36.62 kg/m   Physical Exam Vitals signs and nursing note reviewed.  Constitutional:      Appearance: He is well-developed.  HENT:     Head: Normocephalic.     Nose: Nose normal.  Eyes:  General: No scleral icterus.    Conjunctiva/sclera: Conjunctivae normal.  Neck:     Musculoskeletal: Neck supple.     Thyroid: No thyromegaly.  Cardiovascular:     Rate and Rhythm: Normal rate and regular rhythm.     Heart sounds: No murmur. No friction rub. No gallop.   Pulmonary:     Breath sounds: No stridor. No wheezing or rales.  Chest:     Chest wall: No tenderness.  Abdominal:     General: There is no distension.     Tenderness: There is no abdominal tenderness. There is no rebound.  Genitourinary:    Comments: Tender right flank Musculoskeletal: Normal  range of motion.  Lymphadenopathy:     Cervical: No cervical adenopathy.  Skin:    Findings: No erythema or rash.  Neurological:     Mental Status: He is oriented to person, place, and time.     Motor: No abnormal muscle tone.     Coordination: Coordination normal.  Psychiatric:        Behavior: Behavior normal.      ED Treatments / Results  Labs (all labs ordered are listed, but only abnormal results are displayed) Labs Reviewed  URINALYSIS, ROUTINE W REFLEX MICROSCOPIC - Abnormal; Notable for the following components:      Result Value   APPearance CLOUDY (*)    Specific Gravity, Urine >1.030 (*)    Hgb urine dipstick TRACE (*)    Ketones, ur TRACE (*)    Protein, ur TRACE (*)    All other components within normal limits  BASIC METABOLIC PANEL - Abnormal; Notable for the following components:   Glucose, Bld 163 (*)    Creatinine, Ser 1.37 (*)    All other components within normal limits  CBC - Abnormal; Notable for the following components:   WBC 17.0 (*)    All other components within normal limits  URINALYSIS, MICROSCOPIC (REFLEX) - Abnormal; Notable for the following components:   Bacteria, UA RARE (*)    All other components within normal limits    EKG None  Radiology Ct Renal Stone Study  Result Date: 07/09/2018 CLINICAL DATA:  Hervey Ard RIGHT flank pain since 1000 hours today EXAM: CT ABDOMEN AND PELVIS WITHOUT CONTRAST TECHNIQUE: Multidetector CT imaging of the abdomen and pelvis was performed following the standard protocol without IV contrast. Sagittal and coronal MPR images reconstructed from axial data set. Oral contrast not administered for this indication. COMPARISON:  None FINDINGS: Lower chest: Minimal bibasilar atelectasis dependently Hepatobiliary: Fatty infiltration of liver with focal sparing adjacent to gallbladder fossa. Gallbladder liver otherwise unremarkable. Pancreas: Normal appearance Spleen: Normal appearance Adrenals/Urinary Tract: Adrenal glands  normal appearance. Tiny BILATERAL nonobstructing renal calculi without evidence of renal mass. In addition, mild RIGHT hydronephrosis and hydroureter secondary to a 3 mm distal RIGHT ureteral calculus image 77. LEFT ureter decompressed without calculus. Bladder unremarkable. Stomach/Bowel: Normal appendix. Stomach and bowel loops normal appearance. Vascular/Lymphatic: Aorta normal caliber.  No adenopathy. Reproductive: Prostate gland 4.1 x 3.7 x 4.2 cm (volume = 33 cm^3). Other: Small LEFT inguinal hernia containing fat. No free air or free fluid. No inflammatory process. Musculoskeletal: Unremarkable IMPRESSION: RIGHT hydronephrosis and hydroureter secondary to a 3 mm distal RIGHT ureteral calculus. Additional tiny BILATERAL nonobstructing renal calculi. Small LEFT inguinal hernia containing fat. Mild prostatic enlargement for age. Fatty infiltration of liver. Electronically Signed   By: Lavonia Dana M.D.   On: 07/09/2018 17:32    Procedures Procedures (including critical care time)  Medications Ordered in ED Medications  ketorolac (TORADOL) 30 MG/ML injection 30 mg (30 mg Intravenous Given 07/09/18 1655)  ondansetron (ZOFRAN) injection 4 mg (4 mg Intravenous Given 07/09/18 1655)  HYDROmorphone (DILAUDID) injection 0.5 mg (0.5 mg Intravenous Given 07/09/18 1655)     Initial Impression / Assessment and Plan / ED Course  I have reviewed the triage vital signs and the nursing notes.  Pertinent labs & imaging results that were available during my care of the patient were reviewed by me and considered in my medical decision making (see chart for details).     Patient with a ureteral stone.  He is given pain medicine nausea medicine and Flomax and will follow-up with urologist.  Final Clinical Impressions(s) / ED Diagnoses   Final diagnoses:  Kidney stone    ED Discharge Orders         Ordered    oxyCODONE-acetaminophen (PERCOCET/ROXICET) 5-325 MG tablet  Every 6 hours PRN     07/09/18 1751     ondansetron (ZOFRAN ODT) 4 MG disintegrating tablet     07/09/18 1751    tamsulosin (FLOMAX) 0.4 MG CAPS capsule  Daily     07/09/18 1751           Milton Ferguson, MD 07/09/18 1757

## 2018-07-20 ENCOUNTER — Encounter: Payer: 59 | Admitting: Family Medicine

## 2018-08-02 ENCOUNTER — Encounter: Payer: Self-pay | Admitting: Family Medicine

## 2018-08-02 ENCOUNTER — Ambulatory Visit (INDEPENDENT_AMBULATORY_CARE_PROVIDER_SITE_OTHER): Payer: 59 | Admitting: Family Medicine

## 2018-08-02 VITALS — BP 138/86 | HR 75 | Temp 97.9°F | Ht 72.0 in | Wt 286.0 lb

## 2018-08-02 DIAGNOSIS — F331 Major depressive disorder, recurrent, moderate: Secondary | ICD-10-CM | POA: Diagnosis not present

## 2018-08-02 DIAGNOSIS — Z0001 Encounter for general adult medical examination with abnormal findings: Secondary | ICD-10-CM

## 2018-08-02 DIAGNOSIS — N529 Male erectile dysfunction, unspecified: Secondary | ICD-10-CM

## 2018-08-02 DIAGNOSIS — E559 Vitamin D deficiency, unspecified: Secondary | ICD-10-CM

## 2018-08-02 DIAGNOSIS — Z125 Encounter for screening for malignant neoplasm of prostate: Secondary | ICD-10-CM

## 2018-08-02 DIAGNOSIS — K219 Gastro-esophageal reflux disease without esophagitis: Secondary | ICD-10-CM | POA: Diagnosis not present

## 2018-08-02 DIAGNOSIS — N200 Calculus of kidney: Secondary | ICD-10-CM

## 2018-08-02 DIAGNOSIS — Z0000 Encounter for general adult medical examination without abnormal findings: Secondary | ICD-10-CM

## 2018-08-02 DIAGNOSIS — I10 Essential (primary) hypertension: Secondary | ICD-10-CM

## 2018-08-02 HISTORY — DX: Essential (primary) hypertension: I10

## 2018-08-02 MED ORDER — ESCITALOPRAM OXALATE 10 MG PO TABS: 10.0000 mg | ORAL_TABLET | Freq: Every day | ORAL | 0 refills | Status: DC

## 2018-08-02 NOTE — Progress Notes (Signed)
Eric Weaver is a 48 y.o. male presents to office today for annual physical exam examination.    Concerns today include: 1. Depression/ anxiety Longstanding history of depression/anxiety symptoms.  He reports he has a high stress job and has 4 children at home.  He was previously treated with Wellbutrin and Klonopin many years ago.  He still has several tablets of Klonopin at home and will use this very very sparingly.  He was transitioned over to Lexapro last year and he took it intermittently but never consistently.  He recently restarted this medication about a week ago and thus far has been doing okay taking it at nighttime.  He has not noticed significant impact on libido.  He has not had any increased anxiety symptoms.  He would like a refill on this medication as he would like to stick with that if possible.  No SI, HI.  2.  GERD Patient reports longstanding history of acid reflux symptoms.  He was prescribed Nexium quite some time ago and try to transition over to Pepcid but had recurrence of symptoms.  He denies any history of GI bleed or ulcer.  No diarrhea or constipation.  No melena or hematochezia.  3.  Renal stones Patient was seen in the emergency department for renal stones at the beginning of February.  CAT scan noted a 3 mm stone.  He has since passed 2 of them with use of Flomax.  He has not had any hematuria, dysuria since that time.  No flank pain.  He recently saw urology who prescribed him Cialis for BPH symptoms and help with intermittent ED.  He had labs obtained at that visit but is not sure if PSA was amongst them.  Occupation: Clinical biochemist, Marital status: Married, Substance use: None Diet: Fair, Exercise: No structured. Last eye exam: Overdue and plans to schedule soon.  Sees a provider in [place] Last dental exam: Greater than 1 year Last colonoscopy: n/a Refills needed today: Lexapro Immunizations needed: TDap  Past Medical History:  Diagnosis Date  . GERD  (gastroesophageal reflux disease)    Social History   Socioeconomic History  . Marital status: Married    Spouse name: Not on file  . Number of children: Not on file  . Years of education: Not on file  . Highest education level: Not on file  Occupational History  . Not on file  Social Needs  . Financial resource strain: Not on file  . Food insecurity:    Worry: Not on file    Inability: Not on file  . Transportation needs:    Medical: Not on file    Non-medical: Not on file  Tobacco Use  . Smoking status: Never Smoker  . Smokeless tobacco: Never Used  Substance and Sexual Activity  . Alcohol use: Yes    Comment: rare  . Drug use: No  . Sexual activity: Not on file  Lifestyle  . Physical activity:    Days per week: Not on file    Minutes per session: Not on file  . Stress: Not on file  Relationships  . Social connections:    Talks on phone: Not on file    Gets together: Not on file    Attends religious service: Not on file    Active member of club or organization: Not on file    Attends meetings of clubs or organizations: Not on file    Relationship status: Not on file  . Intimate partner violence:  Fear of current or ex partner: Not on file    Emotionally abused: Not on file    Physically abused: Not on file    Forced sexual activity: Not on file  Other Topics Concern  . Not on file  Social History Narrative  . Not on file   No past surgical history on file. Family History  Problem Relation Age of Onset  . Hypertension Mother     Current Outpatient Medications:  .  escitalopram (LEXAPRO) 10 MG tablet, Take 1 tablet (10 mg total) by mouth daily., Disp: 90 tablet, Rfl: 3 .  esomeprazole (NEXIUM) 20 MG capsule, Take by mouth daily., Disp: , Rfl: 5  No Known Allergies   ROS: Review of Systems Constitutional: negative Eyes: positive for contacts/glasses Ears, nose, mouth, throat, and face: negative Respiratory: negative Cardiovascular:  negative Gastrointestinal: positive for dyspepsia Genitourinary:positive for nocturia and renal stones.  occastional ED Integument/breast: negative Hematologic/lymphatic: negative Musculoskeletal:positive for arthralgias and (right knee and left elbow) Neurological: negative Behavioral/Psych: positive for anxiety and depression Endocrine: negative Allergic/Immunologic: negative    Physical exam BP 138/86   Pulse 75   Temp 97.9 F (36.6 C) (Oral)   Ht 6' (1.829 m)   Wt 286 lb (129.7 kg)   BMI 38.79 kg/m  General appearance: alert, cooperative, appears stated age, no distress and morbidly obese Head: Normocephalic, without obvious abnormality, atraumatic Eyes: negative findings: lids and lashes normal, conjunctivae and sclerae normal, corneas clear and pupils equal, round, reactive to light and accomodation Ears: normal TM's and external ear canals both ears Nose: Nares normal. Septum midline. Mucosa normal. No drainage or sinus tenderness. Throat: lips, mucosa, and tongue normal; teeth and gums normal Neck: no adenopathy, no carotid bruit, no JVD, supple, symmetrical, trachea midline and thyroid not enlarged, symmetric, no tenderness/mass/nodules Back: symmetric, no curvature. ROM normal. No CVA tenderness. Lungs: clear to auscultation bilaterally Chest wall: no tenderness Heart: regular rate and rhythm, S1, S2 normal, no murmur, click, rub or gallop Abdomen: soft, non-tender; bowel sounds normal; no masses,  no organomegaly Extremities: extremities normal, atraumatic, no cyanosis or edema Pulses: 2+ and symmetric Skin: Skin color, texture, turgor normal. No rashes or lesions Lymph nodes: Cervical, supraclavicular, and axillary nodes normal. Neurologic: Grossly normal Psych: mood stable, speech normal. Affect appropriate. Depression screen [facility] 2/9 08/02/2018 12/16/2017 10/28/2017 08/14/2014 06/12/2013  Decreased Interest 0 0 1 0 1  Down, Depressed, Hopeless '1 1 1 ' 0 1  PHQ - 2 Score  '1 1 2 ' 0 2  Altered sleeping 0 - 1 - 0  Tired, decreased energy 0 - 1 - 1  Change in appetite 0 - 1 - 0  Feeling bad or failure about yourself  0 - 1 - 0  Trouble concentrating 0 - 1 - 0  Moving slowly or fidgety/restless 0 - 0 - 1  Suicidal thoughts 0 - 0 - 0  PHQ-9 Score 1 - 7 - 4   No flowsheet data found.   Assessment/ Plan: Eric Weaver here for annual physical exam.   1. Annual physical exam  2. Moderate episode of recurrent major depressive disorder (HCC) Symptomatic.  I think that this patient is somewhat stoic.  He is currently in the first week of Lexapro 10 mg daily.  We will plan to recheck him in about 6 weeks.  If symptoms persist, can consider increasing dose versus trial of Trintellix.  3. Gastroesophageal reflux disease without esophagitis Controlled with Nexium.  No refills needed  4. Essential hypertension  Diet controlled.  5. Morbid obesity ([place]) Recommended diet modification.  Handout provided - Lipid Panel; Future - CMP14+EGFR; Future  6. Sexual concern Trial of Cialis 5 mg with his urologist.  7. Vitamin D deficiency He will come in for fasting labs and recheck a vitamin D which was noted to be less than 26 months ago - VITAMIN D 25 Hydroxy (Vit-D Deficiency, Fractures); Future  8. Screening for malignant neoplasm of prostate - PSA; Future  9. Nephrolithiasis   Handout provided on healthy lifestyle choices, including diet (rich in fruits, vegetables and lean meats and low in salt and simple carbohydrates) and exercise (at least 30 minutes of moderate physical activity daily).  Patient to follow up in 1 year for annual exam or sooner if needed.  Aleck Locklin M. Lajuana Ripple, DO

## 2018-08-02 NOTE — Patient Instructions (Addendum)
Come in for fasting labs at your convenience.  I have renewed the Lexapro.  See me in 6 weeks for recheck.  If you are having side effects or inadequate control of depressive/ anxiety symptoms, we can consider switching you to Trintellix.  Health Maintenance, Male A healthy lifestyle and preventive care is important for your health and wellness. Ask your health care provider about what schedule of regular examinations is right for you. What should I know about weight and diet? Eat a Healthy Diet  Eat plenty of vegetables, fruits, whole grains, low-fat dairy products, and lean protein.  Do not eat a lot of foods high in solid fats, added sugars, or salt.  Maintain a Healthy Weight Regular exercise can help you achieve or maintain a healthy weight. You should:  Do at least 150 minutes of exercise each week. The exercise should increase your heart rate and make you sweat (moderate-intensity exercise).  Do strength-training exercises at least twice a week. Watch Your Levels of Cholesterol and Blood Lipids  Have your blood tested for lipids and cholesterol every 5 years starting at 48 years of age. If you are at high risk for heart disease, you should start having your blood tested when you are 48 years old. You may need to have your cholesterol levels checked more often if: ? Your lipid or cholesterol levels are high. ? You are older than 48 years of age. ? You are at high risk for heart disease. What should I know about cancer screening? Many types of cancers can be detected early and may often be prevented. Lung Cancer  You should be screened every year for lung cancer if: ? You are a current smoker who has smoked for at least 30 years. ? You are a former smoker who has quit within the past 15 years.  Talk to your health care provider about your screening options, when you should start screening, and how often you should be screened. Colorectal Cancer  Routine colorectal cancer  screening usually begins at 49 years of age and should be repeated every 5-10 years until you are 48 years old. You may need to be screened more often if early forms of precancerous polyps or small growths are found. Your health care provider may recommend screening at an earlier age if you have risk factors for colon cancer.  Your health care provider may recommend using home test kits to check for hidden blood in the stool.  A small camera at the end of a tube can be used to examine your colon (sigmoidoscopy or colonoscopy). This checks for the earliest forms of colorectal cancer. Prostate and Testicular Cancer  Depending on your age and overall health, your health care provider may do certain tests to screen for prostate and testicular cancer.  Talk to your health care provider about any symptoms or concerns you have about testicular or prostate cancer. Skin Cancer  Check your skin from head to toe regularly.  Tell your health care provider about any new moles or changes in moles, especially if: ? There is a change in a mole's size, shape, or color. ? You have a mole that is larger than a pencil eraser.  Always use sunscreen. Apply sunscreen liberally and repeat throughout the day.  Protect yourself by wearing long sleeves, pants, a wide-brimmed hat, and sunglasses when outside. What should I know about heart disease, diabetes, and high blood pressure?  If you are 7-56 years of age, have your blood pressure checked  every 3-5 years. If you are 31 years of age or older, have your blood pressure checked every year. You should have your blood pressure measured twice-once when you are at a hospital or clinic, and once when you are not at a hospital or clinic. Record the average of the two measurements. To check your blood pressure when you are not at a hospital or clinic, you can use: ? An automated blood pressure machine at a pharmacy. ? A home blood pressure monitor.  Talk to your health  care provider about your target blood pressure.  If you are between 80-36 years old, ask your health care provider if you should take aspirin to prevent heart disease.  Have regular diabetes screenings by checking your fasting blood sugar level. ? If you are at a normal weight and have a low risk for diabetes, have this test once every three years after the age of 34. ? If you are overweight and have a high risk for diabetes, consider being tested at a younger age or more often.  A one-time screening for abdominal aortic aneurysm (AAA) by ultrasound is recommended for men aged 80-75 years who are current or former smokers. What should I know about preventing infection? Hepatitis B If you have a higher risk for hepatitis B, you should be screened for this virus. Talk with your health care provider to find out if you are at risk for hepatitis B infection. Hepatitis C Blood testing is recommended for:  Everyone born from [birth year] through 1965.  Anyone with known risk factors for hepatitis C. Sexually Transmitted Diseases (STDs)  You should be screened each year for STDs including gonorrhea and chlamydia if: ? You are sexually active and are younger than 48 years of age. ? You are older than 48 years of age and your health care provider tells you that you are at risk for this type of infection. ? Your sexual activity has changed since you were last screened and you are at an increased risk for chlamydia or gonorrhea. Ask your health care provider if you are at risk.  Talk with your health care provider about whether you are at high risk of being infected with HIV. Your health care provider may recommend a prescription medicine to help prevent HIV infection. What else can I do?  Schedule regular health, dental, and eye exams.  Stay current with your vaccines (immunizations).  Do not use any tobacco products, such as cigarettes, chewing tobacco, and e-cigarettes. If you need help quitting, ask  your health care provider.  Limit alcohol intake to no more than 2 drinks per day. One drink equals 12 ounces of beer, 5 ounces of wine, or 1 ounces of hard liquor.  Do not use street drugs.  Do not share needles.  Ask your health care provider for help if you need support or information about quitting drugs.  Tell your health care provider if you often feel depressed.  Tell your health care provider if you have ever been abused or do not feel safe at home. This information is not intended to replace advice given to you by your health care provider. Make sure you discuss any questions you have with your health care provider. Document Released: 11/14/2007 Document Revised: 01/15/2016 Document Reviewed: 02/19/2015 Elsevier Interactive Patient Education  2019 [employer].

## 2018-08-06 ENCOUNTER — Other Ambulatory Visit: Payer: 59

## 2018-08-06 DIAGNOSIS — Z125 Encounter for screening for malignant neoplasm of prostate: Secondary | ICD-10-CM

## 2018-08-06 DIAGNOSIS — E559 Vitamin D deficiency, unspecified: Secondary | ICD-10-CM

## 2018-08-08 LAB — CMP14+EGFR
ALT: 50 IU/L — ABNORMAL HIGH (ref 0–44)
AST: 41 IU/L — ABNORMAL HIGH (ref 0–40)
Albumin/Globulin Ratio: 1.5 (ref 1.2–2.2)
Albumin: 4.6 g/dL (ref 4.0–5.0)
Alkaline Phosphatase: 58 IU/L (ref 39–117)
BUN/Creatinine Ratio: 10 (ref 9–20)
BUN: 12 mg/dL (ref 6–24)
Bilirubin Total: 0.5 mg/dL (ref 0.0–1.2)
CALCIUM: 10 mg/dL (ref 8.7–10.2)
CO2: 23 mmol/L (ref 20–29)
Chloride: 103 mmol/L (ref 96–106)
Creatinine, Ser: 1.21 mg/dL (ref 0.76–1.27)
GFR calc Af Amer: 81 mL/min/{1.73_m2} (ref >59)
GFR calc non Af Amer: 70 mL/min/{1.73_m2} (ref >59)
Globulin, Total: 3.1 g/dL (ref 1.5–4.5)
Glucose: 111 mg/dL — ABNORMAL HIGH (ref 65–99)
Potassium: 5 mmol/L (ref 3.5–5.2)
Sodium: 141 mmol/L (ref 134–144)
TOTAL PROTEIN: 7.7 g/dL (ref 6.0–8.5)

## 2018-08-08 LAB — LIPID PANEL
CHOL/HDL RATIO: 5.6 ratio — AB (ref 0.0–5.0)
Cholesterol, Total: 209 mg/dL — ABNORMAL HIGH (ref 100–199)
HDL: 37 mg/dL — ABNORMAL LOW (ref >39)
LDL Calculated: 140 mg/dL — ABNORMAL HIGH (ref 0–99)
TRIGLYCERIDES: 161 mg/dL — AB (ref 0–149)
VLDL Cholesterol Cal: 32 mg/dL (ref 5–40)

## 2018-08-08 LAB — PSA: Prostate Specific Ag, Serum: 1.8 ng/mL (ref 0.0–4.0)

## 2018-08-08 LAB — VITAMIN D 25 HYDROXY (VIT D DEFICIENCY, FRACTURES): Vit D, 25-Hydroxy: 30.1 ng/mL (ref 30.0–100.0)

## 2018-09-05 ENCOUNTER — Telehealth: Payer: 59 | Admitting: Family

## 2018-09-05 DIAGNOSIS — R300 Dysuria: Secondary | ICD-10-CM

## 2018-09-05 NOTE — Progress Notes (Signed)
We are sorry that you are not feeling well.  Here is how we plan to help!  Male bladder infections are not very common.  We worry about prostate or kidney conditions.  The standard of care is to examine the abdomen and kidneys, and to do a urine and blood test to make sure that something more serious is not going on.   NOTE: If you entered your credit card information for this eVisit, you will not be charged. You may see a "hold" on your card for the $35 but that hold will drop off and you will not have a charge processed.  We recommend that you see a provider today.  If your doctor's office is closed [place] has the following Urgent Cares:  If you need care fast and have a high deductible or no insurance consider:  [email or web address] to reserve your spot online an avoid wait times  [facility] [address] [place], [place] [address] Modified hours of operation: Monday-Friday, 10 AM to 6 PM  Saturday & Sunday 10 AM to 4 PM  [facility] (New Address[facility] [address], [place], [place] [address] *Just off [address], across the road from [place] hours of operation: Monday-Friday, 10 AM to 5 PM  Closed Saturday & Sunday  [facility]'s modified hours of operation will be in effect from Wednesday, April 1st through Thursday, April 30th.   The following sites will take your  insurance:  . [facility] [place] a Provider at this Location  [address] [place], [place] [address] . 10 am to 8 pm Monday-Friday . 12 pm to 8 pm Saturday-Sunday   . [facility] [place] a Provider at this Location  [place], [place], [place] [address] . 8 am to 8 pm Monday-Friday . 9 am to 6 pm Saturday . 11 am to 6 pm Sunday   . [facility] at [place] Get Driving Directions  [address] [place], [place] [address] . 8 am to 8 pm Monday-Friday . 8 am to 4 pm Saturday-Sunday   Your e-visit answers were reviewed by a board certified advanced clinical practitioner to complete your personal care plan.  Thank you for using e-Visits.

## 2018-09-13 ENCOUNTER — Other Ambulatory Visit: Payer: Self-pay

## 2018-09-13 ENCOUNTER — Ambulatory Visit (INDEPENDENT_AMBULATORY_CARE_PROVIDER_SITE_OTHER): Payer: 59 | Admitting: Family Medicine

## 2018-09-13 DIAGNOSIS — F331 Major depressive disorder, recurrent, moderate: Secondary | ICD-10-CM

## 2018-09-13 MED ORDER — ESCITALOPRAM OXALATE 20 MG PO TABS: 20.0000 mg | ORAL_TABLET | Freq: Every day | ORAL | 5 refills | Status: DC

## 2018-09-13 NOTE — Progress Notes (Signed)
Telephone visit  Subjective: CC: f/u depression/ GAD PCP: [name], DO IHW:[id number] Eric Weaver is a 48 y.o. male calls for telephone consult today. Patient provides verbal consent for consult held via phone.  Location of patient: home Location of provider: [facility] Others present for call: none  1. Depression/ GAD Patient was seen about 5 weeks ago for depression and anxiety.  He had a longstanding history of symptoms, particularly in the setting of a high stress job and family responsibilities.  He had been on Lexapro 10 mg for about a week during our last visit.  He states that symptoms have gotten quite a bit better on the Lexapro 10 mg daily.  While he still has times where he feels like he responds partially to stressors he feels that his limit is much higher and he does not get as easily irritable.  He continues to have quite a bit of stress at home and at work, particularly given the COVID-19 outbreak.  His children her at home and he has several children under 86 years old.  Most of his spare time is spent doing projects around the home and he does not have much time for himself.  Sleep is good but he only sleeps about 5 hours per night.  Often, he will find that his energy is not as good as it could be.  Again, the short duration of sleep is primarily due to various responsibilities not because he cannot get to sleep.   ROS: Per HPI  No Known Allergies Past Medical History:  Diagnosis Date  . Essential hypertension 08/02/2018  . GERD (gastroesophageal reflux disease)     Current Outpatient Medications:  .  escitalopram (LEXAPRO) 10 MG tablet, Take 1 tablet (10 mg total) by mouth daily., Disp: 90 tablet, Rfl: 0 .  esomeprazole (NEXIUM) 20 MG capsule, Take by mouth daily., Disp: , Rfl: 5 .  tadalafil (CIALIS) 5 MG tablet, Take 5 mg by mouth daily as needed., Disp: , Rfl:  Depression screen [facility] 2/9 09/13/2018 08/02/2018 12/16/2017  Decreased Interest 1 0 0  Down, Depressed, Hopeless 0  1 1  PHQ - 2 Score 1 1 1   Altered sleeping 1 0 -  Tired, decreased energy 1 0 -  Change in appetite 0 0 -  Feeling bad or failure about yourself  - 0 -  Trouble concentrating 0 0 -  Moving slowly or fidgety/restless 0 0 -  Suicidal thoughts 0 0 -  PHQ-9 Score 3 1 -   GAD 7 : Generalized Anxiety Score 09/13/2018  Nervous, Anxious, on Edge 1  Control/stop worrying 0  Worry too much - different things 0  Trouble relaxing 0  Restless 0  Easily annoyed or irritable 1  Afraid - awful might happen 0  Total GAD 7 Score 2  Anxiety Difficulty Not difficult at all   Assessment/ Plan: 48 y.o. male   1. Moderate episode of recurrent major depressive disorder (HCC) Subjectively he is feeling better on the 10 mg but does want to go ahead and proceed with a 20 mg dose of Lexapro.  He was much more open during our visit today about symptoms at home.  Most of these seem to be primarily driven by work and family responsibilities.  He will follow-up with me if he has any issues with the Lexapro 20 mg daily.  Otherwise, we will plan to reconvene in about 6 months, sooner if needed.   Start time: 3:40pm End time: 3:51pm  Total time  spent on patient care (including telephone call/ virtual visit): 15 minutes  [place], [place] [phone]

## 2018-10-08 ENCOUNTER — Telehealth: Payer: 59 | Admitting: Physician Assistant

## 2018-10-08 DIAGNOSIS — J069 Acute upper respiratory infection, unspecified: Secondary | ICD-10-CM

## 2018-10-08 MED ORDER — IPRATROPIUM BROMIDE 0.06 % NA SOLN: 2.0000 | Freq: Four times a day (QID) | NASAL | 0 refills | Status: DC

## 2018-10-08 MED ORDER — BENZONATATE 100 MG PO CAPS: 100.0000 mg | ORAL_CAPSULE | Freq: Three times a day (TID) | ORAL | 0 refills | Status: DC

## 2018-10-08 NOTE — Progress Notes (Signed)
We are sorry you are not feeling well.  Here is how we plan to help!  Based on what you have shared with me, it looks like you may have a viral upper respiratory infection.  Upper respiratory infections are caused by a large number of viruses; however, rhinovirus is the most common cause.   Symptoms vary from person to person, with common symptoms including sore throat, cough, and fatigue or lack of energy.  A low-grade fever of up to 100.4 may present, but is often uncommon.  Symptoms vary however, and are closely related to a person's age or underlying illnesses.  The most common symptoms associated with an upper respiratory infection are nasal discharge or congestion, cough, sneezing, headache and pressure in the ears and face.  These symptoms usually persist for about 3 to 10 days, but can last up to 2 weeks.  It is important to know that upper respiratory infections do not cause serious illness or complications in most cases.    Upper respiratory infections can be transmitted from person to person, with the most common method of transmission being a person's hands.  The virus is able to live on the skin and can infect other persons for up to 2 hours after direct contact.  Also, these can be transmitted when someone coughs or sneezes; thus, it is important to cover the mouth to reduce this risk.  To keep the spread of the illness at [place], good hand hygiene is very important.  This is an infection that is most likely caused by a virus. There are no specific treatments other than to help you with the symptoms until the infection runs its course.  We are sorry you are not feeling well.  Here is how we plan to help!   For nasal congestion, you may use an oral decongestants such as Mucinex D or if you have glaucoma or high blood pressure use plain Mucinex.  Saline nasal spray or nasal drops can help and can safely be used as often as needed for congestion.  For your congestion, I have prescribed Ipratropium  Bromide nasal spray 0.03% two sprays in each nostril 2-3 times a day  If you do not have a history of heart disease, hypertension, diabetes or thyroid disease, prostate/bladder issues or glaucoma, you may also use Sudafed to treat nasal congestion.  It is highly recommended that you consult with a pharmacist or your primary care physician to ensure this medication is safe for you to take.     If you have a cough, you may use cough suppressants such as Delsym and Robitussin.  If you have glaucoma or high blood pressure, you can also use Coricidin HBP.     If you have a sore or scratchy throat, use a saltwater gargle-  to  teaspoon of salt dissolved in a 4-ounce to 8-ounce glass of warm water.  Gargle the solution for approximately 15-30 seconds and then spit.  It is important not to swallow the solution.  You can also use throat lozenges/cough drops and Chloraseptic spray to help with throat pain or discomfort.  Warm or cold liquids can also be helpful in relieving throat pain.  For headache, pain or general discomfort, you can use Ibuprofen or Tylenol as directed.   Some authorities believe that zinc sprays or the use of Echinacea may shorten the course of your symptoms.   HOME CARE Only take medications as instructed by your medical team. Be sure to drink plenty of fluids.  Water is fine as well as fruit juices, sodas and electrolyte beverages. You may want to stay away from caffeine or alcohol. If you are nauseated, try taking small sips of liquids. How do you know if you are getting enough fluid? Your urine should be a pale yellow or almost colorless. Get rest. Taking a steamy shower or using a humidifier may help nasal congestion and ease sore throat pain. You can place a towel over your head and breathe in the steam from hot water coming from a faucet. Using a saline nasal spray works much the same way. Cough drops, hard candies and sore throat lozenges may ease your cough. Avoid close  contacts especially the very young and the elderly Cover your mouth if you cough or sneeze Always remember to wash your hands.   GET HELP RIGHT AWAY IF: You develop worsening fever. If your symptoms do not improve within 10 days You develop yellow or green discharge from your nose over 3 days. You have coughing fits You develop a severe head ache or visual changes. You develop shortness of breath, difficulty breathing or start having chest pain  Your symptoms persist after you have completed your treatment plan  MAKE SURE YOU  Understand these instructions. Will watch your condition. Will get help right away if you are not doing well or get worse.  Your e-visit answers were reviewed by a board certified advanced clinical practitioner to complete your personal care plan. Depending upon the condition, your plan could have included both over the counter or prescription medications. Please review your pharmacy choice. If there is a problem, you may call our nursing hot line at and have the prescription routed to another pharmacy. Your safety is important to [place]. If you have drug allergies check your prescription carefully.   You can use MyChart to ask questions about today's visit, request a non-urgent call back, or ask for a work or school excuse for 24 hours related to this e-Visit. If it has been greater than 24 hours you will need to follow up with your provider, or enter a new e-Visit to address those concerns. You will get an e-mail in the next two days asking about your experience.  I hope that your e-visit has been valuable and will speed your recovery. Thank you for using e-visits.      ===View-only below this line===   ----- Message -----    From: Dia Crawford    Sent: 10/08/2018 10:40 AM EDT      To: E-Visit Mailing List Subject: E-Visit Submission: Upper Respiratory Infection  E-Visit Submission: Upper Respiratory Infection --------------------------------  Question:  Which of the following are you experiencing? Answer:   Post-nasal drip (mucus running down back of throat)  Question: How long have you been having these symptoms? Answer:   2 days  Question: Do you have a fever? Answer:   No, I do not have a fever  Question: Are you in close contact with anyone who has similar symptoms ? Answer:   No  Question: Are you treated for any of the following conditions: Asthma, COPD, Diabetes, Renal Failure (on Dialysis), AIDS, any Neuromuscular disease that effects the clearing of secretions, Heart Failure, or Heart Disease? Answer:   No  Question: Are you taking any over the counter medications for your symptoms? Answer:   No  Question: Please list your medication allergies that you may have ? (If 'none' , please list as 'none') Answer:   None  Question: Please  list any additional comments  Answer:   Not so much mucus as it is blood.  Can last for up to an hour. It stops and may have nothing for another 6 or 8 hours and it starts again. Seems less from 2 days ago. Employer made [place] start to wear masks. Maybe that agrivated sinuses?  A total of 5-10 minutes was spent evaluating this patients questionnaire and formulating a plan of care.

## 2018-10-15 ENCOUNTER — Other Ambulatory Visit: Payer: Self-pay | Admitting: Family Medicine

## 2018-10-29 ENCOUNTER — Other Ambulatory Visit: Payer: Self-pay | Admitting: Physician Assistant

## 2018-11-27 ENCOUNTER — Other Ambulatory Visit: Payer: Self-pay | Admitting: Family Medicine

## 2018-11-27 ENCOUNTER — Other Ambulatory Visit: Payer: Self-pay | Admitting: Family

## 2019-01-08 ENCOUNTER — Other Ambulatory Visit: Payer: Self-pay | Admitting: Family

## 2019-01-18 ENCOUNTER — Other Ambulatory Visit: Payer: Self-pay | Admitting: *Deleted

## 2019-01-18 ENCOUNTER — Encounter: Payer: Self-pay | Admitting: Family Medicine

## 2019-01-18 MED ORDER — VITAMIN D (ERGOCALCIFEROL) 1.25 MG (50000 UNIT) PO CAPS: ORAL_CAPSULE | ORAL | 1 refills | Status: DC

## 2019-04-11 ENCOUNTER — Other Ambulatory Visit: Payer: Self-pay | Admitting: Family Medicine

## 2019-04-13 ENCOUNTER — Other Ambulatory Visit: Payer: Self-pay | Admitting: Family Medicine

## 2019-04-23 ENCOUNTER — Other Ambulatory Visit: Payer: Self-pay

## 2019-04-23 ENCOUNTER — Emergency Department (HOSPITAL_COMMUNITY)
Admission: EM | Admit: 2019-04-23 | Discharge: 2019-04-23 | Disposition: A | Discharge status: Home / Self Care | Payer: 59 | Attending: Emergency Medicine | Admitting: Emergency Medicine

## 2019-04-23 ENCOUNTER — Emergency Department (HOSPITAL_COMMUNITY): Payer: 59

## 2019-04-23 ENCOUNTER — Encounter (HOSPITAL_COMMUNITY): Payer: Self-pay

## 2019-04-23 DIAGNOSIS — R4020 Unspecified coma: Secondary | ICD-10-CM | POA: Diagnosis not present

## 2019-04-23 DIAGNOSIS — R1111 Vomiting without nausea: Secondary | ICD-10-CM | POA: Diagnosis not present

## 2019-04-23 DIAGNOSIS — M47812 Spondylosis without myelopathy or radiculopathy, cervical region: Secondary | ICD-10-CM | POA: Diagnosis not present

## 2019-04-23 DIAGNOSIS — K920 Hematemesis: Secondary | ICD-10-CM | POA: Diagnosis not present

## 2019-04-23 DIAGNOSIS — J358 Other chronic diseases of tonsils and adenoids: Secondary | ICD-10-CM | POA: Insufficient documentation

## 2019-04-23 DIAGNOSIS — J387 Other diseases of larynx: Secondary | ICD-10-CM | POA: Diagnosis not present

## 2019-04-23 DIAGNOSIS — I10 Essential (primary) hypertension: Secondary | ICD-10-CM | POA: Diagnosis not present

## 2019-04-23 DIAGNOSIS — J3489 Other specified disorders of nose and nasal sinuses: Secondary | ICD-10-CM | POA: Diagnosis not present

## 2019-04-23 DIAGNOSIS — J359 Chronic disease of tonsils and adenoids, unspecified: Secondary | ICD-10-CM | POA: Diagnosis not present

## 2019-04-23 DIAGNOSIS — R58 Hemorrhage, not elsewhere classified: Secondary | ICD-10-CM | POA: Diagnosis not present

## 2019-04-23 DIAGNOSIS — R042 Hemoptysis: Secondary | ICD-10-CM | POA: Diagnosis not present

## 2019-04-23 DIAGNOSIS — R55 Syncope and collapse: Secondary | ICD-10-CM | POA: Diagnosis not present

## 2019-04-23 LAB — CBC WITH DIFFERENTIAL/PLATELET
Abs Immature Granulocytes: 0.04 10*3/uL (ref 0.00–0.07)
Basophils Absolute: 0 10*3/uL (ref 0.0–0.1)
Basophils Relative: 0 %
Eosinophils Absolute: 0 10*3/uL (ref 0.0–0.5)
Eosinophils Relative: 0 %
HCT: 39.4 % (ref 39.0–52.0)
Hemoglobin: 12.9 g/dL — ABNORMAL LOW (ref 13.0–17.0)
Immature Granulocytes: 0 %
Lymphocytes Relative: 17 %
Lymphs Abs: 1.5 10*3/uL (ref 0.7–4.0)
MCH: 29.9 pg (ref 26.0–34.0)
MCHC: 32.7 g/dL (ref 30.0–36.0)
MCV: 91.2 fL (ref 80.0–100.0)
Monocytes Absolute: 0.4 10*3/uL (ref 0.1–1.0)
Monocytes Relative: 4 %
Neutro Abs: 7.2 10*3/uL (ref 1.7–7.7)
Neutrophils Relative %: 79 %
Platelets: 289 10*3/uL (ref 150–400)
RBC: 4.32 MIL/uL (ref 4.22–5.81)
RDW: 13 % (ref 11.5–15.5)
WBC: 9.2 10*3/uL (ref 4.0–10.5)
nRBC: 0 % (ref 0.0–0.2)

## 2019-04-23 LAB — BASIC METABOLIC PANEL
Anion gap: 8 (ref 5–15)
BUN: 27 mg/dL — ABNORMAL HIGH (ref 6–20)
CO2: 23 mmol/L (ref 22–32)
Calcium: 8.5 mg/dL — ABNORMAL LOW (ref 8.9–10.3)
Chloride: 105 mmol/L (ref 98–111)
Creatinine, Ser: 0.68 mg/dL (ref 0.61–1.24)
GFR calc Af Amer: >60 mL/min (ref >60)
GFR calc non Af Amer: >60 mL/min (ref >60)
Glucose, Bld: 136 mg/dL — ABNORMAL HIGH (ref 70–99)
Potassium: 4.5 mmol/L (ref 3.5–5.1)
Sodium: 136 mmol/L (ref 135–145)

## 2019-04-23 LAB — PROTIME-INR
INR: 1 (ref 0.8–1.2)
Prothrombin Time: 13.1 seconds (ref 11.4–15.2)

## 2019-04-23 LAB — GROUP A STREP BY PCR: Group A Strep by PCR: NOT DETECTED

## 2019-04-23 MED ORDER — IOHEXOL 300 MG/ML  SOLN
75.0000 mL | Freq: Once | INTRAMUSCULAR | Status: AC | PRN
  Administered 2019-04-23: 75 mL via INTRAVENOUS

## 2019-04-23 MED ORDER — SODIUM CHLORIDE 0.9 % IV BOLUS
500.0000 mL | Freq: Once | INTRAVENOUS | Status: AC
  Administered 2019-04-23: 500 mL via INTRAVENOUS

## 2019-04-23 NOTE — ED Triage Notes (Signed)
Pt reports spitting up blood that started yesterday, reports wife saw a place on back of throat that was swollen. Pt denies pain, but does say he can feel when clot in back of throat is about to break, and he will spit up the clot, then starts spitting up blood.

## 2019-04-23 NOTE — ED Provider Notes (Signed)
[facility] EMERGENCY DEPARTMENT Provider Note   CSN: [id number] Arrival date & time: 04/23/19  0133     History   Chief Complaint Chief Complaint  Patient presents with   spitting up blood    HPI Eric Weaver is a 48 y.o. male.     Patient presents to the emergency department for evaluation of throat discomfort and bleeding.  He reports that he had symptoms similar to this approximately 6 months ago that resolved.  He has now noticing blood in the back of his throat again over the last day or 2.  His wife has looked in his throat and reports that there is a swollen area that appears to be bleeding.  He denies nosebleed.  No hematemesis.     Past Medical History:  Diagnosis Date   Essential hypertension 08/02/2018   GERD (gastroesophageal reflux disease)     Patient Active Problem List   Diagnosis Date Noted   Essential hypertension 08/02/2018   Nephrolithiasis 08/02/2018   Vitamin D deficiency 08/02/2018   Morbid obesity ([place]) 08/02/2018   Depression 12/16/2017   Gastroesophageal reflux disease 02/11/2017    History reviewed. No pertinent surgical history.      Home Medications    Prior to Admission medications   Medication Sig Start Date End Date Taking? Authorizing Provider  escitalopram (LEXAPRO) 20 MG tablet Take 1 tablet (20 mg total) by mouth daily. (Needs to be seen before next refill) 04/14/19   [name], DO  esomeprazole (NEXIUM) 20 MG capsule Take by mouth daily. 11/15/17   [provider]  ipratropium (ATROVENT) 0.06 % nasal spray Place 2 sprays into both nostrils 4 (four) times daily. 10/08/18   Tereasa Coop, PA-C  tadalafil (CIALIS) 5 MG tablet Take 5 mg by mouth daily as needed.    [provider]  Vitamin D, Ergocalciferol, (DRISDOL) 1.25 MG (50000 UT) CAPS capsule TAKE 1 CAPSULE BY MOUTH WEEKLY 01/18/19   [name], DO    Family History Family History  Problem Relation Age of Onset    Hypertension Mother     Social History Social History   Tobacco Use   Smoking status: Never Smoker   Smokeless tobacco: Never Used  Substance Use Topics   Alcohol use: Yes    Comment: rare   Drug use: No     Allergies   Patient has no known allergies.   Review of Systems Review of Systems  HENT: Positive for sore throat.   All other systems reviewed and are negative.    Physical Exam Updated Vital Signs BP (!) 155/85 (BP Location: Right Arm)    Pulse 88    Temp 98.9 F (37.2 C) (Oral)    Resp 17    Ht 6' (1.829 m)    Wt 124.7 kg    SpO2 98%    BMI 37.30 kg/m   Physical Exam Vitals signs and nursing note reviewed.  Constitutional:      General: He is not in acute distress.    Appearance: Normal appearance. He is well-developed.  HENT:     Head: Normocephalic and atraumatic.     Right Ear: Hearing normal.     Left Ear: Hearing normal.     Nose: Nose normal.     Mouth/Throat:     Comments: Abnormal swelling of the right soft palate and tonsillar area with purplish discoloration of the mucosa.  Small amount of ulceration in the central area of the abnormal mucosa, no  active bleeding. Eyes:     Conjunctiva/sclera: Conjunctivae normal.     Pupils: Pupils are equal, round, and reactive to light.  Neck:     Musculoskeletal: Normal range of motion and neck supple.  Cardiovascular:     Rate and Rhythm: Regular rhythm.     Heart sounds: S1 normal and S2 normal. No murmur. No friction rub. No gallop.   Pulmonary:     Effort: Pulmonary effort is normal. No respiratory distress.     Breath sounds: Normal breath sounds.  Chest:     Chest wall: No tenderness.  Abdominal:     General: Bowel sounds are normal.     Palpations: Abdomen is soft.     Tenderness: There is no abdominal tenderness. There is no guarding or rebound. Negative signs include Murphy's sign and McBurney's sign.     Hernia: No hernia is present.  Musculoskeletal: Normal range of motion.  Skin:     General: Skin is warm and dry.     Findings: No rash.  Neurological:     Mental Status: He is alert and oriented to person, place, and time.     GCS: GCS eye subscore is 4. GCS verbal subscore is 5. GCS motor subscore is 6.     Cranial Nerves: No cranial nerve deficit.     Sensory: No sensory deficit.     Coordination: Coordination normal.  Psychiatric:        Speech: Speech normal.        Behavior: Behavior normal.        Thought Content: Thought content normal.      ED Treatments / Results  Labs (all labs ordered are listed, but only abnormal results are displayed) Labs Reviewed  CBC WITH DIFFERENTIAL/PLATELET - Abnormal; Notable for the following components:      Result Value   Hemoglobin 12.9 (*)    All other components within normal limits  GROUP A STREP BY PCR  PROTIME-INR  BASIC METABOLIC PANEL    EKG None  Radiology Ct Soft Tissue Neck W Contrast  Result Date: 04/23/2019 CLINICAL DATA:  Initial evaluation for abnormal right tonsil with associated bleeding. EXAM: CT NECK WITH CONTRAST TECHNIQUE: Multidetector CT imaging of the neck was performed using the standard protocol following the bolus administration of intravenous contrast. CONTRAST:  59mL OMNIPAQUE IOHEXOL 300 MG/ML  SOLN COMPARISON:  None available. FINDINGS: Pharynx and larynx: Oral cavity within normal limits. No acute abnormality about the dentition. There is a discrete mass centered at the level of the right palatine tonsil measuring approximately 4.1 x 4.0 x 5.3 cm (series 2, image 43). Probable ulceration at the medial aspect of the lesion noted. Lesion extends anteriorly and inferiorly to involve the right base of tongue. Superior extension into the right nasopharynx. Lesion does not definitely cross the midline. Associated thickening of the right aspect of the epiglottis and right aryepiglottic fold, which could also be involved (series 2, image 54). Left palatine tonsil within normal limits as is the  remainder of the oropharynx and nasopharynx. Remainder of the hypopharynx and supraglottic larynx within normal limits. Glottis within normal limits. Subglottic airway clear. Salivary glands: Salivary glands including the parotid and submandibular glands are within normal limits. Thyroid: Thyroid within normal limits. Lymph nodes: Enlarged partially necrotic right level II nodes measure up to 15 mm in short axis (series 2, image 57). 12 mm partially necrotic right level III node noted (series 2, image 62). Few additional subcentimeter right level III nodes  are somewhat rounded in morphology, also suspected to be involved (series 2, images 66 a, 68, 70). No other pathologically enlarged lymph nodes identified. No left-sided cervical adenopathy. Vascular: Normal intravascular enhancement seen throughout the neck. Limited intracranial: Unremarkable. Visualized orbits: Visualized globes and orbital soft tissues within normal limits. Mastoids and visualized paranasal sinuses: Minor mucosal thickening noted within the right maxillary sinus. Visualized paranasal sinuses are otherwise clear. Mastoid air cells and middle ear cavities are well pneumatized and free of fluid. Skeleton: No acute osseous abnormality. Subcentimeter sclerotic focus at the left mandibular condyle noted, favored to be degenerative in nature (series 3, image 19). No other worrisome lytic or blastic osseous lesions. Moderate cervical spondylolysis noted at C5-6 and C6-7. Upper chest: Visualized upper chest demonstrates no significant finding. Partially visualized lung apices are clear. Other: None. IMPRESSION: 1. 4.1 x 4.0 x 5.3 cm mass centered at the right palatine tonsil as above, highly concerning for primary head and neck malignancy, likely squamous cell carcinoma. 2. Enlarged partially necrotic right level II and III adenopathy as above, concerning for nodal metastases. 3. No other acute abnormality within the neck. Electronically Signed   By:  Jeannine Boga M.D.   On: 04/23/2019 06:23    Procedures Procedures (including critical care time)  Medications Ordered in ED Medications  sodium chloride 0.9 % bolus 500 mL (0 mLs Intravenous Stopped 04/23/19 0550)  iohexol (OMNIPAQUE) 300 MG/ML solution 75 mL (75 mLs Intravenous Contrast Given 04/23/19 0545)     Initial Impression / Assessment and Plan / ED Course  I have reviewed the triage vital signs and the nursing notes.  Pertinent labs & imaging results that were available during my care of the patient were reviewed by me and considered in my medical decision making (see chart for details).        Patient presents to the emergency department for evaluation of throat discomfort and bleeding.  Examination reveals grossly abnormal right tonsillar area with overlying mucosal changes and ulceration.  CT confirms mass suspicious for squamous cell carcinoma.  Patient does have blood-tinged sputum that he is spitting up periodically but no active or brisk bleeding currently.  Will need prompt follow-up with ENT.  Discussed with Dr. Janace Hoard, can be seen in the office tomorrow or the next day if he calls tomorrow at 8 AM.  Patient to be given return precautions for increased bleeding.  Final Clinical Impressions(s) / ED Diagnoses   Final diagnoses:  Tonsillar mass    ED Discharge Orders    None       Schneur Crowson, Gwenyth Allegra, MD 04/23/19 [phone]

## 2019-04-23 NOTE — Discharge Instructions (Addendum)
If you start to have any heavy bleeding that is causing you to have trouble swallowing or breathing, call 911.  Otherwise call the ENT office listed above at 8 AM tomorrow to be worked in for an appointment.

## 2019-04-24 DIAGNOSIS — Z7289 Other problems related to lifestyle: Secondary | ICD-10-CM | POA: Diagnosis not present

## 2019-04-24 DIAGNOSIS — C099 Malignant neoplasm of tonsil, unspecified: Secondary | ICD-10-CM | POA: Diagnosis not present

## 2019-04-24 DIAGNOSIS — J358 Other chronic diseases of tonsils and adenoids: Secondary | ICD-10-CM | POA: Diagnosis not present

## 2019-04-24 HISTORY — PX: OTHER SURGICAL HISTORY: SHX169

## 2019-05-01 ENCOUNTER — Telehealth: Payer: Self-pay | Admitting: *Deleted

## 2019-05-08 NOTE — Telephone Encounter (Signed)
Oncology Nurse Navigator Documentation  Rec'd call from Mr. Shadowens wife.  She indicated he is a patient of ENT Dr. Janeice Robinson, recently diagnosed with tonsil cancer, she would like to schedule appts on his behalf.  She noted CT Neck conducted 11/22, bx 11/23, PET ordered. I indicated husband has yet to be referred to [facility], referral needed prior to scheduling.  Also explained results of PET should be available prior to consults so discussion optimal.  She voiced understanding.  Answered her questions re treatment options for tonsil cancer, encouraged her to contact Dr. Janeice Robinson office re referrals.  Gayleen Orem, RN, BSN Head & Neck Oncology Nurse [employer] at [place] [phone]

## 2019-05-10 ENCOUNTER — Other Ambulatory Visit: Payer: Self-pay | Admitting: *Deleted

## 2019-05-10 DIAGNOSIS — C099 Malignant neoplasm of tonsil, unspecified: Secondary | ICD-10-CM

## 2019-05-12 ENCOUNTER — Telehealth: Payer: Self-pay | Admitting: *Deleted

## 2019-05-12 ENCOUNTER — Encounter: Payer: Self-pay | Admitting: Radiation Oncology

## 2019-05-12 DIAGNOSIS — C090 Malignant neoplasm of tonsillar fossa: Secondary | ICD-10-CM | POA: Insufficient documentation

## 2019-05-12 NOTE — Telephone Encounter (Signed)
Oncology Nurse Navigator Documentation  LVMM for pt wife indicating PET has been approved, scheduled for next [facility] Radiology, 10:30 arrival.  I indicated I will call them on Monday with additional guidelines.  Gayleen Orem, RN, BSN Head & Neck Oncology Nurse [place] at [place] [phone]

## 2019-05-15 ENCOUNTER — Telehealth: Payer: Self-pay | Admitting: *Deleted

## 2019-05-15 NOTE — Telephone Encounter (Signed)
Oncology Nurse Navigator Documentation  Spoke with Eric Weaver, confirmed his understanding of Wed's PET, [facility] Radiology, with 10:30 arrival; confirmed his understanding of NPO status beginning Tuesday midnight.  In separate call, spoke with pt wife, reiterated above, confirmed her understanding of my submitting a pre-radiotherapy dental referral.  Gayleen Orem, RN, BSN Head & Neck Oncology Nurse [place] at [place] [phone]

## 2019-05-16 ENCOUNTER — Telehealth (HOSPITAL_COMMUNITY): Payer: Self-pay

## 2019-05-17 ENCOUNTER — Encounter: Payer: Self-pay | Admitting: Radiation Oncology

## 2019-05-17 ENCOUNTER — Encounter (HOSPITAL_COMMUNITY)
Admission: RE | Admit: 2019-05-17 | Discharge: 2019-05-17 | Disposition: A | Discharge status: Home / Self Care | Payer: BC Managed Care – PPO | Source: Ambulatory Visit | Attending: Radiation Oncology | Admitting: Radiation Oncology

## 2019-05-17 ENCOUNTER — Other Ambulatory Visit: Payer: Self-pay

## 2019-05-17 ENCOUNTER — Encounter (HOSPITAL_COMMUNITY): Payer: Self-pay | Admitting: Dentistry

## 2019-05-17 ENCOUNTER — Ambulatory Visit (HOSPITAL_COMMUNITY): Payer: Self-pay | Admitting: Dentistry

## 2019-05-17 VITALS — BP 174/84 | HR 67 | Temp 98.3°F

## 2019-05-17 DIAGNOSIS — C760 Malignant neoplasm of head, face and neck: Secondary | ICD-10-CM | POA: Diagnosis not present

## 2019-05-17 DIAGNOSIS — C099 Malignant neoplasm of tonsil, unspecified: Secondary | ICD-10-CM | POA: Diagnosis not present

## 2019-05-17 DIAGNOSIS — C090 Malignant neoplasm of tonsillar fossa: Secondary | ICD-10-CM

## 2019-05-17 DIAGNOSIS — K08409 Partial loss of teeth, unspecified cause, unspecified class: Secondary | ICD-10-CM

## 2019-05-17 DIAGNOSIS — Z01818 Encounter for other preprocedural examination: Secondary | ICD-10-CM | POA: Diagnosis not present

## 2019-05-17 DIAGNOSIS — K06010 Localized gingival recession, unspecified: Secondary | ICD-10-CM

## 2019-05-17 DIAGNOSIS — K011 Impacted teeth: Secondary | ICD-10-CM

## 2019-05-17 DIAGNOSIS — K0530 Chronic periodontitis, unspecified: Secondary | ICD-10-CM

## 2019-05-17 DIAGNOSIS — K029 Dental caries, unspecified: Secondary | ICD-10-CM

## 2019-05-17 DIAGNOSIS — K036 Deposits [accretions] on teeth: Secondary | ICD-10-CM

## 2019-05-17 LAB — GLUCOSE, CAPILLARY: Glucose-Capillary: 103 mg/dL — ABNORMAL HIGH (ref 70–99)

## 2019-05-17 MED ORDER — FLUDEOXYGLUCOSE F - 18 (FDG) INJECTION
14.2500 | Freq: Once | INTRAVENOUS | Status: AC
  Administered 2019-05-17: 14.25 via INTRAVENOUS

## 2019-05-17 MED ORDER — SODIUM FLUORIDE 1.1 % DT CREA: TOPICAL_CREAM | DENTAL | 99 refills | Status: DC

## 2019-05-17 NOTE — Progress Notes (Signed)
Head and Neck Cancer Location of Tumor / Histology:  04/24/19 RIGHT TONSIL, BIOPSY: Invasive squamous cell carcinoma P16: Diffusely positive in the tumor cells.  Patient presented to the ED on 04/23/19 with symptoms of: Coughing up blood. He did also report an episode of coughing up blood about 6 months ago with a few episodes since.   Biopsies of right tonsil revealed: Invasive squamous cell carcinoma.   Nutrition Status Yes No Comments  Weight changes? []  [x]    Swallowing concerns? []  [x]    PEG? []  [x]     Referrals Yes No Comments  Social Work? []  [x]    Dentistry? [x]  []  05/17/19  Swallowing therapy? []  [x]    Nutrition? []  [x]    Med/Onc? []  [x]  05/24/19   Safety Issues Yes No Comments  Prior radiation? []  [x]    Pacemaker/ICD? []  [x]    Possible current pregnancy? []  [x]    Is the patient on methotrexate? []  [x]     Tobacco/Marijuana/Snuff/ETOH use: He has never smoked, he drinks alcohol daily.    Past/Anticipated interventions by otolaryngology, if any:  04/24/19 Dr. Constance Holster Impression & Plans:  Right tonsillar mass with associated adenopathy. This is concerning for HPV related oropharyngeal cancer. Lymphoma is another possibility. Biopsy was taken today. We will discuss results when available later in the week.    Past/Anticipated interventions by medical oncology, if any:  Dr. Lorenso Courier 05/24/19   Current Complaints / other details:   PET 05/17/19

## 2019-05-17 NOTE — Progress Notes (Addendum)
DENTAL CONSULTATION  Date of Consultation:  05/17/2019 Patient Name:   Eric Weaver Date of Birth:   [birth year] Medical Record Number: [id number]  COVID 19 SCREENING: The patient does not symptoms concerning for COVID-19 infection (Including fever, chills, cough, or new SHORTNESS OF BREATH).   VITALS: BP (!) 174/84 (BP Location: Right Arm)   Pulse 67   Temp 98.3 F (36.8 C)   CHIEF COMPLAINT: Patient referred by Dr. Isidore Moos for dental consultation.  HPI: Eric Weaver is a 48 year old male recently diagnosed with squamous cell carcinoma of the right tonsil.  Patient with anticipated chemoradiation therapy.  Patient is now seen as part of a medically necessary prechemoradiation therapy dental protocol examination.  The patient currently denies acute toothaches, swellings, or abscesses.  Patient was last seen by a dentist in 1995 to have a crown cemented on tooth #31.  This was with Dr. Ephraim Hamburger in [place], [place].  Patient denies having any partial dentures.  Patient denies having any dental phobia.  PROBLEM LIST: Patient Active Problem List   Diagnosis Date Noted  . Carcinoma of tonsillar fossa ([place]) 05/12/2019    Priority: High  . Essential hypertension 08/02/2018  . Nephrolithiasis 08/02/2018  . Vitamin D deficiency 08/02/2018  . Morbid obesity ([place]) 08/02/2018  . Depression 12/16/2017  . Gastroesophageal reflux disease 02/11/2017    PMH: Past Medical History:  Diagnosis Date  . Essential hypertension 08/02/2018  . GERD (gastroesophageal reflux disease)     PSH: History reviewed. No pertinent surgical history.  ALLERGIES: No Known Allergies  MEDICATIONS: Current Outpatient Medications  Medication Sig Dispense Refill  . escitalopram (LEXAPRO) 20 MG tablet Take 1 tablet (20 mg total) by mouth daily. (Needs to be seen before next refill) 30 tablet 0  . esomeprazole (NEXIUM) 20 MG capsule Take by mouth daily.  5  . ipratropium (ATROVENT) 0.06 % nasal spray Place  2 sprays into both nostrils 4 (four) times daily. 15 mL 0  . tadalafil (CIALIS) 5 MG tablet Take 5 mg by mouth daily as needed.    . Vitamin D, Ergocalciferol, (DRISDOL) 1.25 MG (50000 UT) CAPS capsule TAKE 1 CAPSULE BY MOUTH WEEKLY 12 capsule 1   No current facility-administered medications for this visit.    LABS: Lab Results  Component Value Date   WBC 9.2 04/23/2019   HGB 12.9 (L) 04/23/2019   HCT 39.4 04/23/2019   MCV 91.2 04/23/2019   PLT 289 04/23/2019      Component Value Date/Time   NA 136 04/23/2019 0613   NA 141 08/06/2018 0946   K 4.5 04/23/2019 0613   CL 105 04/23/2019 0613   CO2 23 04/23/2019 0613   GLUCOSE 136 (H) 04/23/2019 0613   BUN 27 (H) 04/23/2019 0613   BUN 12 08/06/2018 0946   CREATININE 0.68 04/23/2019 0613   CALCIUM 8.5 (L) 04/23/2019 0613   GFRNONAA >60 04/23/2019 0613   GFRAA >60 04/23/2019 [id number]   Lab Results  Component Value Date   INR 1.0 04/23/2019   No results found for: PTT  SOCIAL HISTORY: Social History   Socioeconomic History  . Marital status: Married    Spouse name: Not on file  . Number of children: Not on file  . Years of education: Not on file  . Highest education level: Not on file  Occupational History  . Not on file  Tobacco Use  . Smoking status: Never Smoker  . Smokeless tobacco: Never Used  Substance and Sexual Activity  . Alcohol use: Yes  Comment: rare  . Drug use: No  . Sexual activity: Not on file  Other Topics Concern  . Not on file  Social History Narrative  . Not on file   Social Determinants of Health   Financial Resource Strain:   . Difficulty of Paying Living Expenses: Not on file  Food Insecurity:   . Worried About Charity fundraiser in the Last Year: Not on file  . Ran Out of Food in the Last Year: Not on file  Transportation Needs:   . Lack of Transportation (Medical): Not on file  . Lack of Transportation (Non-Medical): Not on file  Physical Activity:   . Days of Exercise per Week:  Not on file  . Minutes of Exercise per Session: Not on file  Stress:   . Feeling of Stress : Not on file  Social Connections:   . Frequency of Communication with Friends and Family: Not on file  . Frequency of Social Gatherings with Friends and Family: Not on file  . Attends Religious Services: Not on file  . Active Member of Clubs or Organizations: Not on file  . Attends Archivist Meetings: Not on file  . Marital Status: Not on file  Intimate Partner Violence:   . Fear of Current or Ex-Partner: Not on file  . Emotionally Abused: Not on file  . Physically Abused: Not on file  . Sexually Abused: Not on file    FAMILY HISTORY: Family History  Problem Relation Age of Onset  . Hypertension Mother     REVIEW OF SYSTEMS: Reviewed with the patient as per History of present illness. Psych: Patient denies having dental phobia.  DENTAL HISTORY: CHIEF COMPLAINT: Patient referred by Dr. Isidore Moos for dental consultation.  HPI: Eric Weaver is a 48 year old male recently diagnosed with squamous cell carcinoma of the right tonsil.  Patient with anticipated chemoradiation therapy.  Patient is now seen as part of a medically necessary prechemoradiation therapy dental protocol examination.  The patient currently denies acute toothaches, swellings, or abscesses.  Patient was last seen by a dentist in 1995 to have a crown cemented on tooth #31.  This was with Dr. Ephraim Hamburger in [place], [place].  Patient denies having any partial dentures.  Patient denies having any dental phobia.   DENTAL EXAMINATION: GENERAL: The patient is a well-developed, well-nourished male in no acute distress. HEAD AND NECK: Patient has significant right neck lymphadenopathy.  I do not palpate any left neck lymphadenopathy.  The patient denies acute TMJ symptoms.  Maximum interincisal opening is measured at 45 mm. INTRAORAL EXAM: Patient has normal saliva.  I do not see any evidence of oral abscess  formation. DENTITION: Patient is missing tooth numbers 17 and 32.  Tooth numbers 1 and 16 are impacted. PERIODONTAL: Patient has chronic periodontitis with plaque and calculus accumulations, gingival recession, and incipient to moderate bone loss.  No significant tooth mobility is noted. DENTAL CARIES/SUBOPTIMAL RESTORATIONS: Patient has caries noted on the mesial and distal of tooth #30.  The patient may have other incipient dental caries that can be evaluated by his primary dentist for restoration as indicated after chemoradiation therapy has been completed. ENDODONTIC: Patient denies any acute pulpitis symptoms.  I do not see any evidence of periapical pathology or radiolucency. CROWN AND BRIDGE: Patient has a crown on tooth #31. PROSTHODONTIC: Patient denies having partial dentures. OCCLUSION: Patient has a stable occlusion.  RADIOGRAPHIC INTERPRETATION: An orthopantogram was taken and supplemented with a full series of dental radiographs.  Patient is missing tooth numbers 17 and 32.  Tooth numbers 1 and 16 are impacted wisdom teeth.  There is incipient to moderate bone loss. There are caries associated with the mesial and distal of tooth #30.  Patient has a crown on tooth #31.  Radiographic calculus is noted.   ASSESSMENTS: 1.  Squamous cell carcinoma of the right tonsil 2.  Prechemoradiation therapy dental protocol 3.  Dental caries 4.  Chronic periodontitis with bone loss 5.  Gingival recession 6.  Accretions 7.  Missing tooth numbers 17 and 32 8.  Impacted tooth numbers 1 and 16 9.  Stable occlusion  PLAN/RECOMMENDATIONS: 1. I discussed the risks, benefits, and complications of various treatment options with the patient in relationship to his medical and dental conditions, anticipated chemoradiation therapy, and chemoradiation therapy side effects to include xerostomia, radiation caries, trismus, mucositis, taste changes,, gum and jawbone changes, and risk for infection and  osteoradionecrosis.. We discussed various treatment options to include no treatment, multiple extractions with alveoloplasty, pre-prosthetic surgery as indicated, periodontal therapy, dental restorations, root canal therapy, crown and bridge therapy, implant therapy, and replacement of missing teeth as indicated.  We also discussed fabrication of fluoride trays and scatter protection devices.  The patient currently wishes to proceed with impressions today for the fabrication of fluoride trays and scatter protection devices.  Patient tolerated this procedure well.  Patient is also in agreement to follow-up with Dr. Laretta Alstrom who has taken over Dr. Chuck Hint practice for the MOD restoration on tooth #30.  I will contact Dr. Luan Pulling to help facilitate this restoration.  Patient is aware that he may require extraction of tooth numbers 1 , 2, 16, and 31 if these teeth are in the primary field of radiation therapy.  I will consult with Dr. Isidore Moos to determine the anticipated ports and doses for future radiation therapy.  A consultation with Dr. Isidore Moos is scheduled for Friday, 05/19/2019.  A prescription for Prevident 5000 fluoride was sent to the [facility] pharmacy in [facility] at the request of the patient.  There are refills for 1 year.   2. Discussion of findings with medical team and coordination of future medical and dental care as needed.  I spent in excess of  120 minutes during the conduct of this consultation and >50% of this time involved direct face-to-face encounter for counseling and/or coordination of the patient's care.    Lenn Cal, DDS

## 2019-05-17 NOTE — Patient Instructions (Signed)
RADIATION THERAPY AND DECISIONS REGARDING YOUR TEETH  Xerostomia (dry mouth) Your salivary glands may be in the filed of radiation.  Radiation may include all or part of your saliva glands.  This will cause your saliva to dry up and you will have a dry mouth.  The dry mouth will be for the rest of your life unless your radiation oncologist tells you otherwise.  Your saliva has many functions:  Saliva wets your tongue for speaking.  It coats your teeth and the inside of your mouth for easier movement.  It helps with chewing and swallowing food.  It helps clean away harmful acid and toxic products made by the germs in your mouth, therefore it helps prevent cavities.  It kills some germs in your mouth and helps to prevent gum disease.  It helps to carry flavor to your taste buds.  Once you have lost your saliva you will be at higher risk for tooth decay and gum disease.  What can be done to help improve your mouth when there's not enough saliva:  1.  Your dentist may give a prescription for Salagen.  It will not bring back all of your saliva but may bring back some of it.  Also your saliva may be thick and ropy or white and foamy. It will not feel like it use to feel.  2.  You will need to swish with water every time your mouth feels dry.  YOU CANNOT suck on any cough drops, mints, lemon drops, candy, vitamin C or any other products.  You cannot use anything other than water to make your mouth feel less dry.  If you want to drink anything else you have to drink it all at once and brush afterwards.  Be sure to discuss the details of your diet habits with your dentist or hygienist.  Radiation caries: This is decay that happens very quickly once your mouth is very dry due to radiation therapy.  Normally cavities take six months to two years to become a problem.  When you have dry mouth cavities may take as little as eight weeks to cause you a problem.  This is why dental check ups every two  months are necessary as long as you have a dry mouth. Radiation caries typically, but not always, start at your gum line where it is hard to see the cavity.  It is therefore also hard to fill these cavities adequately.  This high rate of cavities happens because your mouth no longer has saliva and therefore the acid made by the germs starts the decay process.  Whenever you eat anything the germs in your mouth change the food into acid.  The acid then burns a small hole in your tooth.  This small hole is the beginning of a cavity.  If this is not treated then it will grow bigger and become a cavity.  The way to avoid this hole getting bigger is to use fluoride every evening as prescribed by your dentist.  You have to make sure that your teeth are very clean before you use the fluoride.  This fluoride in turn will strengthen your teeth and prepare them for another day of fighting acid.  If you develop radiation caries many times the damage is so large that you will have to have all your teeth removed.  This could be a big problem if some of these teeth are in the field of radiation.  Further details of why this could be   a big problem will follow.  (See Osteoradionecrosis).  Loss of taste (dysgeusia) This happens to varying degrees once you've had radiation therapy to your jaw region.  Many times taste is not completely lost but becomes limited.  The loss of taste is mostly due to radiation affecting your taste buds.  However if you have no saliva in your mouth to carry the flavor to your taste buds it would be difficult for your taste buds to taste anything.  That is why using water or a prescription for Salagen prior to meals and during meals may help with some of the taste.  Keep in mind that taste generally returns very slowly over the course of several months or several years after radiation therapy.  Don't give up hope.  Trismus According to your Radiation Oncologist your TMJ or jaw joints are going to be  partially or fully in the field of radiation.  This means that over time the muscles that help you open and close your mouth may get stiff.  This will potentially result in your not being able to open your mouth wide enough or as wide as you can open it now.  Le me give you an example of how slowly this happens and how unaware people are of it.  A gentlemen that had radiation therapy two years ago came back to me complaining that bananas are just too large for him to be able to fit them in between his teeth.  He was not able to open wide enough to bite into a banana.  This happens slowly and over a period of time.  What do we do to try and prevent this?  Your dentist will probably give you a stack of sticks called a trismus exercise device .  This stack will help your remind your muscles and your jaw joint to open up to the same distance every day.  Use these sticks every morning when you wake up according to the instructions given by the dentist.   You must use these sticks for at least one to two years after radiation therapy.  The reason for that is because it happens so slowly and keeps going on for about two years after radiation therapy.  Your hospital dentist will help you monitor your mouth opening and make sure that it's not getting smaller.  Osteoradionecrosis (ORN) This is a condition where your jaw bone after having had radiation therapy becomes very dry.  It has very little blood supply to keep it alive.  If you develop a cavity that turns into an abscess or an infection then the jaw bone does not have enough blood supply to help fight the infection.  At this point it is very likely that the infection could cause the death of your jaw bone.  When you have dead bone it has to be removed.  Therefore you might end up having to have surgery to remove part of your jaw bone, the part of the jaw bone that has been affected.   Healing is also a problem if you are to have surgery in the areas where the bone  has had radiation therapy.  The same reasons apply.  If you have surgery you need more blood supply which is not available.  When blood supply and oxygen are not available again, there is a chance for the bone to die.  Occasionally ORN happens on its own with no obvious reason.  This is quite rare.  We believe that   patients who continue to smoke and/or drink alcohol have a higher chance of having this bone problem.  Therefore once your jaw bone has had radiation therapy if there are any teeth in that area, you should never have them pulled.  You should also never have any surgery on your teeth or gums in that area unless the oral surgeon or Periodontist is aware of your history of radiation. There is some expensive management techniques that might be used to limit your risks.  The risks for ORN either from infection or spontaneous ( or on it's own) are life long.    TRISMUS  Trismus is a condition where the jaw does not allow the mouth to open as wide as it usually does.  This can happen almost suddenly, or in other cases the process is so slow, it is hard to notice it-until it is too far along.  When the jaw joints and/or muscles have been exposed to radiation treatments, the onset of Trismus is very slow.  This is because the muscles are losing their stretching ability over a long period of time, as long as 2 YEARS after the end of radiation.  It is therefore important to exercise these muscles and joints.  TRISMUS EXERCISES   Stack of tongue depressors measuring the same or a little less than the last documented MIO (Maximum Interincisal Opening).  Secure them with a rubber band on both ends.  Place the stack in the patient's mouth, supporting the other end.  Allow 30 seconds for muscle stretching.  Rest for a few seconds.  Repeat 3-5 times  For all radiation patients, this exercise is recommended in the mornings and evenings unless otherwise instructed.  The exercise should be done for  a period of 2 YEARS after the end of radiation.  MIO should be checked routinely on recall dental visits by the general dentist or the hospital dentist.  The patient is advised to report any changes, soreness, or difficulties encountered when doing the exercises.   Marland Kitchenrk

## 2019-05-18 ENCOUNTER — Encounter (HOSPITAL_COMMUNITY): Payer: Self-pay | Admitting: Dentistry

## 2019-05-18 ENCOUNTER — Telehealth: Payer: Self-pay | Admitting: *Deleted

## 2019-05-18 NOTE — Telephone Encounter (Signed)
Oncology Nurse Navigator Documentation  Spoke with patient wife.  Informed her:  ENT Conference discussion indicated chemoRT vs surgery is recommended tmt option.  I indicated Dr. Isidore Moos can review results of yesterday's PET and elaborate on Conference discussion during tomorrow's MyChart video consult.  Appt has been scheduled 12/22 2:00 with Dr. Lorenso Courier, I will join husband during consult.   Husband should bring FMLA forms on Tuesday.  Encouraged her/him to call me with questions/concerns prior to Tuesday's appt.  Gayleen Orem, RN, BSN Head & Neck Oncology Nurse [place] [facility] [place] [phone]

## 2019-05-19 ENCOUNTER — Ambulatory Visit
Admission: RE | Admit: 2019-05-19 | Discharge: 2019-05-19 | Disposition: A | Discharge status: Home / Self Care | Payer: BC Managed Care – PPO | Source: Ambulatory Visit | Attending: Radiation Oncology | Admitting: Radiation Oncology

## 2019-05-19 ENCOUNTER — Other Ambulatory Visit: Payer: Self-pay

## 2019-05-19 ENCOUNTER — Encounter: Payer: Self-pay | Admitting: Radiation Oncology

## 2019-05-19 ENCOUNTER — Telehealth: Payer: Self-pay | Admitting: Hematology and Oncology

## 2019-05-19 DIAGNOSIS — C090 Malignant neoplasm of tonsillar fossa: Secondary | ICD-10-CM

## 2019-05-19 DIAGNOSIS — R590 Localized enlarged lymph nodes: Secondary | ICD-10-CM | POA: Diagnosis not present

## 2019-05-19 NOTE — Progress Notes (Signed)
Dental Form with Estimates of Radiation Dose  To: Dr Tommie Raymond.   Reinhold Atkerson 1970-07-30     Diagnosis:    ICD-10-CM   1. Carcinoma of tonsillar fossa ([place])  C09.0     Prognosis: curable  Anticipated # of fractions: 35    Daily?: yes  # of weeks of radiotherapy: 7  Chemotherapy?: yes  Anticipated xerostomia:  Mild permanent    Pre-simulation needs:  Scatter protection    Simulation: ASAP    Other Notes:  Please contact Eppie Gibson, MD, with patient's disposition after evaluation and/or dental treatment. Would love to be able to simulate patient by Jan 4th. Patient is concerned about lack of dental insurance.  -----------------------------------  Eppie Gibson, MD

## 2019-05-19 NOTE — Progress Notes (Signed)
Radiation Oncology         [phone] ________________________________  Initial outpatient Consultation by MyChart video due to pandemic precautions  Name: Eric Weaver MRN: [id number]  Date: 05/19/2019  DOB: 1970/12/26  EP:PIRJJOACZY, Koleen Distance, DO  Izora Gala, MD   REFERRING PHYSICIAN: Izora Gala, MD  DIAGNOSIS:    ICD-10-CM   1. Carcinoma of tonsillar fossa ([place])  C09.0 Ambulatory referral to Social Work    Ambulatory referral to Physical Therapy    Amb Referral to Nutrition and Diabetic E    Referral to Neuro Rehab   CHIEF COMPLAINT: Here to discuss management of tonsillar cancer  Cancer Staging Carcinoma of tonsillar fossa ([place]) Staging form: Pharynx - HPV-Mediated Oropharynx, AJCC 8th Edition - Clinical stage from 05/12/2019: Stage II (cT3, cN2, cM0, p16+) - Signed by Eppie Gibson, MD on 05/19/2019    HISTORY OF PRESENT ILLNESS::Eric Weaver is a 48 y.o. male who presented with intermittent bleeding from his throat. He initially presented with this in 09/2018 and was treated for an upper respiratory infection. His symptoms improved at that time. On 04/23/2019, he presented to the ED with the same symptoms of intermittent bleeding from his throat. His wife reported at that time that she could see a swollen area in his throat that appeared to be bleeding. Physical exam confirmed abnormal swelling of the right soft palate and tonsillar area. He underwent neck CT at that time, which revealed: 5.3 cm mass centered at the right palatine tonsil, highly concerning for squamous cell carcinoma; enlarged partially necrotic right level II and III adenopathy, concerning for nodal metastases.  Subsequently, the patient saw Dr. Constance Holster who performed biopsy of the right tonsil. This revealed: invasive squamous cell carcinoma, p16 positive.  Pertinent imaging thus far includes PET scan performed on 05/17/2019 revealing large hypermetabolic mass centered in the right palatine tonsil  consistent with primary head/neck cancer; hypermetabolic nodal metastasis to a right level 2 and level 3 lymph node; indeterminate midline crossing of carcinoma with hypermetabolic tissue at the left tonsil without clear lesion on CT; mildly hypermetabolic lymph node in the left level 2 neck, cannot exclude nodal metastasis. I have reviewed his imaging personally.  He met with Dr. Enrique Sack on 05/17/2019 and is scheduled to meet Dr. Lorenso Courier on 05/24/2019.  Swallowing issues, if any: none  Weight Changes:  Wt Readings from Last 3 Encounters:  04/23/19 275 lb (124.7 kg)  08/02/18 286 lb (129.7 kg)  07/09/18 270 lb (122.5 kg)   Pain status: none  Tobacco history, if any: none  ETOH abuse, if any: drinks about 4 oz whiskey on most days  Prior cancers, if any: none  PREVIOUS RADIATION THERAPY: No  PAST MEDICAL HISTORY:  has a past medical history of Depression, Essential hypertension (08/02/2018), GERD (gastroesophageal reflux disease), History of nephrolithiasis, and Vitamin D deficiency.    PAST SURGICAL HISTORY: Past Surgical History:  Procedure Laterality Date  . Biopsy of right tonsil Right 04/24/2019   Positive for squamous cell carcinoma    FAMILY HISTORY: family history includes Heart attack in his father; Hypertension in his mother.  SOCIAL HISTORY:  reports that he has never smoked. He has never used smokeless tobacco. He reports current alcohol use. He reports that he does not use drugs.  ALLERGIES: Patient has no known allergies.  MEDICATIONS:  Current Outpatient Medications  Medication Sig Dispense Refill  . escitalopram (LEXAPRO) 20 MG tablet Take 1 tablet (20 mg total) by mouth daily. (Needs to be seen before next  refill) 30 tablet 0  . esomeprazole (NEXIUM) 20 MG capsule Take by mouth daily.  5  . sodium fluoride (PREVIDENT 5000 PLUS) 1.1 % CREA dental cream Apply to tooth brush. Brush teeth for 2 minutes. Spit out excess. So NOT swallow. Repeat nightly. 1 Tube prn    . tadalafil (CIALIS) 5 MG tablet Take 5 mg by mouth daily as needed.    . Vitamin D, Ergocalciferol, (DRISDOL) 1.25 MG (50000 UT) CAPS capsule TAKE 1 CAPSULE BY MOUTH WEEKLY 12 capsule 1  . ipratropium (ATROVENT) 0.06 % nasal spray Place 2 sprays into both nostrils 4 (four) times daily. (Patient not taking: Reported on 05/17/2019) 15 mL 0   No current facility-administered medications for this encounter.    REVIEW OF SYSTEMS:  Notable for that above.   PHYSICAL EXAM:  vitals were not taken for this visit.    General: Alert and oriented, in no acute distress  Psychiatric: Judgment and insight are intact. Affect is appropriate.   ECOG = 1  0 - Asymptomatic (Fully active, able to carry on all predisease activities without restriction)  1 - Symptomatic but completely ambulatory (Restricted in physically strenuous activity but ambulatory and able to carry out work of a light or sedentary nature. For example, light housework, office work)  2 - Symptomatic, <50% in bed during the day (Ambulatory and capable of all self care but unable to carry out any work activities. Up and about more than 50% of waking hours)  3 - Symptomatic, >50% in bed, but not bedbound (Capable of only limited self-care, confined to bed or chair 50% or more of waking hours)  4 - Bedbound (Completely disabled. Cannot carry on any self-care. Totally confined to bed or chair)  5 - Death   Eustace Pen MM, Creech RH, Tormey DC, et al. [phone]). "Toxicity and response criteria of the [facility]". [place] Oncol. 5 (6): 649-55   LABORATORY DATA:  Lab Results  Component Value Date   WBC 9.2 04/23/2019   HGB 12.9 (L) 04/23/2019   HCT 39.4 04/23/2019   MCV 91.2 04/23/2019   PLT 289 04/23/2019   CMP     Component Value Date/Time   NA 136 04/23/2019 0613   NA 141 08/06/2018 0946   K 4.5 04/23/2019 0613   CL 105 04/23/2019 0613   CO2 23 04/23/2019 0613   GLUCOSE 136 (H) 04/23/2019 0613   BUN  27 (H) 04/23/2019 0613   BUN 12 08/06/2018 0946   CREATININE 0.68 04/23/2019 0613   CALCIUM 8.5 (L) 04/23/2019 0613   PROT 7.7 08/06/2018 0946   ALBUMIN 4.6 08/06/2018 0946   AST 41 (H) 08/06/2018 0946   ALT 50 (H) 08/06/2018 0946   ALKPHOS 58 08/06/2018 0946   BILITOT 0.5 08/06/2018 0946   GFRNONAA >60 04/23/2019 0613   GFRAA >60 04/23/2019 0613      Lab Results  Component Value Date   TSH 1.350 10/28/2017     RADIOGRAPHY: CT Soft Tissue Neck W Contrast  Result Date: 04/23/2019 CLINICAL DATA:  Initial evaluation for abnormal right tonsil with associated bleeding. EXAM: CT NECK WITH CONTRAST TECHNIQUE: Multidetector CT imaging of the neck was performed using the standard protocol following the bolus administration of intravenous contrast. CONTRAST:  6m OMNIPAQUE IOHEXOL 300 MG/ML  SOLN COMPARISON:  None available. FINDINGS: Pharynx and larynx: Oral cavity within normal limits. No acute abnormality about the dentition. There is a discrete mass centered at the level of the right palatine  tonsil measuring approximately 4.1 x 4.0 x 5.3 cm (series 2, image 43). Probable ulceration at the medial aspect of the lesion noted. Lesion extends anteriorly and inferiorly to involve the right base of tongue. Superior extension into the right nasopharynx. Lesion does not definitely cross the midline. Associated thickening of the right aspect of the epiglottis and right aryepiglottic fold, which could also be involved (series 2, image 54). Left palatine tonsil within normal limits as is the remainder of the oropharynx and nasopharynx. Remainder of the hypopharynx and supraglottic larynx within normal limits. Glottis within normal limits. Subglottic airway clear. Salivary glands: Salivary glands including the parotid and submandibular glands are within normal limits. Thyroid: Thyroid within normal limits. Lymph nodes: Enlarged partially necrotic right level II nodes measure up to 15 mm in short axis (series 2,  image 57). 12 mm partially necrotic right level III node noted (series 2, image 62). Few additional subcentimeter right level III nodes are somewhat rounded in morphology, also suspected to be involved (series 2, images 66 a, 68, 70). No other pathologically enlarged lymph nodes identified. No left-sided cervical adenopathy. Vascular: Normal intravascular enhancement seen throughout the neck. Limited intracranial: Unremarkable. Visualized orbits: Visualized globes and orbital soft tissues within normal limits. Mastoids and visualized paranasal sinuses: Minor mucosal thickening noted within the right maxillary sinus. Visualized paranasal sinuses are otherwise clear. Mastoid air cells and middle ear cavities are well pneumatized and free of fluid. Skeleton: No acute osseous abnormality. Subcentimeter sclerotic focus at the left mandibular condyle noted, favored to be degenerative in nature (series 3, image 19). No other worrisome lytic or blastic osseous lesions. Moderate cervical spondylolysis noted at C5-6 and C6-7. Upper chest: Visualized upper chest demonstrates no significant finding. Partially visualized lung apices are clear. Other: None. IMPRESSION: 1. 4.1 x 4.0 x 5.3 cm mass centered at the right palatine tonsil as above, highly concerning for primary head and neck malignancy, likely squamous cell carcinoma. 2. Enlarged partially necrotic right level II and III adenopathy as above, concerning for nodal metastases. 3. No other acute abnormality within the neck. Electronically Signed   By: Jeannine Boga M.D.   On: 04/23/2019 06:23   NM PET Image Initial (PI) Skull Base To Thigh  Result Date: 05/17/2019 CLINICAL DATA:  Initial treatment strategy for head neck carcinoma. Tonsillar biopsy 04/24/2019 EXAM: NUCLEAR MEDICINE PET SKULL BASE TO THIGH TECHNIQUE: 14.2 mCi F-18 FDG was injected intravenously. Full-ring PET imaging was performed from the skull base to thigh after the radiotracer. CT data was  obtained and used for attenuation correction and anatomic localization. Fasting blood glucose: 103 mg/dl COMPARISON:  Neck CT 04/23/2019 FINDINGS: Mediastinal blood pool activity: SUV max 3.2 Liver activity: SUV max NA NECK: Large hypermetabolic mass centered at the RIGHT palatine tonsil measuring 3.9 x 3.4 cm (image 33/) with intense metabolic activity (SUV max 26.7). There is moderate hypermetabolic activity the LEFT palatine tonsil region with SUV max equal 8.2. No mass lesion identified. Hypermetabolic RIGHT level 2 lymph node along the anterior margin the sternocleidomastoid muscle measures 17 mm (image 38/) SUV max 11.2. Slightly more inferior, below the level of the hyoid bone, 10 mm RIGHT level 3 lymph node (image 92//9) is hypermetabolic SUV max 9.2. Within the LEFT neck, single small lymph node LEFT level 2 node measuring 7 mm short axis image 35/4 has moderate metabolic activity for size (SUV max equal 2.8). Incidental CT findings: none CHEST: No hypermetabolic mediastinal or hilar nodes. No suspicious pulmonary nodules on the CT  scan. Incidental CT findings: none ABDOMEN/PELVIS: No abnormal hypermetabolic activity within the liver, pancreas, adrenal glands, or spleen. No hypermetabolic lymph nodes in the abdomen or pelvis. Incidental CT findings: Low-attenuation liver consistent hepatic steatosis. SKELETON: Incidental CT findings: none IMPRESSION: 1. Large hypermetabolic mass centered in the RIGHT palatine tonsil consistent with primary head neck cancer. 2. Hypermetabolic nodal metastasis to a RIGHT level 2 and level 3 lymph node. 3. Indeterminate midline crossing of carcinoma with hypermetabolic tissue at the LEFT tonsil without clear lesion on CT. 4. Mildly hypermetabolic lymph node in the LEFT level 2 neck, cannot exclude nodal metastasis. Correlate with findings at the LEFT palatine tonsil. Electronically Signed   By: Suzy Bouchard M.D.   On: 05/17/2019 15:30      IMPRESSION/PLAN:  This is a  delightful patient with head and neck cancer. I recommend radiotherapy for this patient.  We discussed the potential risks, benefits, and side effects of radiotherapy. We talked in detail about acute and late effects. We discussed that some of the most bothersome acute effects may be mucositis, dysgeusia, salivary changes, skin irritation, hair loss, dehydration, weight loss and fatigue. We talked about late effects which include but are not necessarily limited to dysphagia, hypothyroidism, nerve injury, spinal cord injury, xerostomia, trismus, and neck edema. No guarantees of treatment were given. The patient is enthusiastic about proceeding with treatment. I look forward to participating in the patient's care.    Simulation (treatment planning) will take place once cleared by dentistry.   We also discussed that the treatment of head and neck cancer is a multidisciplinary process to maximize treatment outcomes and quality of life. For this reason the following referrals have been or will be made:   Medical oncology to discuss chemotherapy - anticipate ChRT concurrently for 7 weeks   Dentistry for dental evaluation, possible extractions in the radiation fields, and /or advice on reducing risk of cavities, osteoradionecrosis, or other oral issues.   Nutritionist for nutrition support during and after treatment.   Speech language pathology for swallowing and/or speech therapy.   Social work for social support.    Physical therapy due to risk of lymphedema in neck and deconditioning.  NOTE: He lives in [place].  We discussed the option of receiving treatment closer to home.  He prefers to get his treatment here in [employer].[place]land Kitchen  He also understands that he may need a temporary feeding tube.  He will talk about this with medical oncology and possibly medical oncology will order this to be placed at the same time as his Port-A-Cath.  This encounter was provided by telemedicine platform  MyChart Video. The patient has given verbal consent for this type of encounter and has been advised to only accept a meeting of this type in a secure network environment. The time spent during this encounter was 44 minutes. The attendants for this meeting include Eppie Gibson  and Dia Crawford.  During the encounter, Eppie Gibson was located at [facility] Radiation Oncology Department.  Eric Weaver was located at home.   ______________   Eppie Gibson, MD   This document serves as a record of services personally performed by Eppie Gibson, MD. It was created on her behalf by Wilburn Mylar, a trained medical scribe. The creation of this record is based on the scribe's personal observations and the provider's statements to them. This document has been checked and approved by the attending provider.

## 2019-05-19 NOTE — Telephone Encounter (Signed)
A new hem appt has been scheduled for the pt to see Dr. Lorenso Courier on 12/23 at 3pm. Mr. Mounteer has been made aware to arrive 15 minutes early.

## 2019-05-22 ENCOUNTER — Telehealth: Payer: Self-pay | Admitting: Nutrition

## 2019-05-22 NOTE — Telephone Encounter (Signed)
Added nutrition appt per 12/21 sch  Message - pt to get an updated schedule next appt

## 2019-05-22 NOTE — Progress Notes (Signed)
Has armband been applied?  Yes  Does patient have an allergy to IV contrast dye?: No   Has patient ever received premedication for IV contrast dye?: N/A  Does patient take metformin?: No  If patient does take metformin when was the last dose: N/A  Date of lab work: 04/23/19 BUN: 27 CR: 0.68 EGFR: >60  IV site: R AC  Has IV site been added to flowsheet?  Yes

## 2019-05-23 ENCOUNTER — Ambulatory Visit
Admission: RE | Admit: 2019-05-23 | Discharge: 2019-05-23 | Disposition: A | Discharge status: Home / Self Care | Payer: BC Managed Care – PPO | Source: Ambulatory Visit | Attending: Radiation Oncology | Admitting: Radiation Oncology

## 2019-05-23 ENCOUNTER — Other Ambulatory Visit: Payer: Self-pay

## 2019-05-23 ENCOUNTER — Ambulatory Visit (HOSPITAL_COMMUNITY): Payer: Self-pay | Admitting: Dentistry

## 2019-05-23 ENCOUNTER — Encounter (HOSPITAL_COMMUNITY): Payer: Self-pay | Admitting: Dentistry

## 2019-05-23 ENCOUNTER — Encounter: Payer: Self-pay | Admitting: *Deleted

## 2019-05-23 ENCOUNTER — Other Ambulatory Visit: Payer: Self-pay | Admitting: *Deleted

## 2019-05-23 VITALS — BP 128/84 | HR 74 | Temp 97.8°F | Resp 20 | Wt 292.8 lb

## 2019-05-23 VITALS — BP 149/90 | HR 70 | Temp 98.2°F

## 2019-05-23 DIAGNOSIS — C090 Malignant neoplasm of tonsillar fossa: Secondary | ICD-10-CM | POA: Insufficient documentation

## 2019-05-23 DIAGNOSIS — Z463 Encounter for fitting and adjustment of dental prosthetic device: Secondary | ICD-10-CM

## 2019-05-23 DIAGNOSIS — C099 Malignant neoplasm of tonsil, unspecified: Secondary | ICD-10-CM | POA: Diagnosis not present

## 2019-05-23 DIAGNOSIS — Z510 Encounter for antineoplastic radiation therapy: Secondary | ICD-10-CM | POA: Insufficient documentation

## 2019-05-23 DIAGNOSIS — Z01818 Encounter for other preprocedural examination: Secondary | ICD-10-CM | POA: Diagnosis not present

## 2019-05-23 LAB — GLUCOSE, CAPILLARY: Glucose-Capillary: 140 mg/dL — ABNORMAL HIGH (ref 70–99)

## 2019-05-23 MED ORDER — SODIUM CHLORIDE 0.9% FLUSH
10.0000 mL | Freq: Once | INTRAVENOUS | Status: AC
  Administered 2019-05-23: 10:00:00 10 mL via INTRAVENOUS

## 2019-05-23 NOTE — Progress Notes (Signed)
Oncology Nurse Navigator Documentation  To provide support, encouragement and care continuity, met with Mr. Weisgerber during his CT SIM. He was unaccompanied d/t [facility] COVID safety practices.    He tolerated procedure without difficulty, denied questions/concerns.  I toured him to [facility] 2 treatment area, explained procedures for lobby registration, arrival to Radiation Waiting, arrival to tmt area and preparation for tmt.  He voiced understanding.    Met with him in Radiation Nursing.  Provided/discussed New Patient Packet: o Contact information for physician(s), myself, other members of the Care Team. o Advance Directive information ([place] blue pamphlet with LCSW contact info); provided [facility] AD booklet, encouraged him to contact Gwinda Maine LCSW to complete. o Fall Prevention Patient [place] o [facility] campus map with highlight of [place] o SLP information sheet o Symptom Management Clinic information . Provided and discussed education handouts for PEG and PAC, showed him examples.   Escorted him to lobby entrance where he was met by wife.  I encouraged them to contact me with questions/concerns as treatments/procedures begin.     Gayleen Orem, RN, BSN Head & Neck Oncology Nurse [facility] [phone]

## 2019-05-23 NOTE — Patient Instructions (Signed)
COVID-19 Education: The signs and symptoms of COVID-19 were discussed with the patient and how to seek care for testing (follow up with PCP or arrange E-visit).   The importance of social distancing was discussed today.  FLUORIDE TRAYS PATIENT INSTRUCTIONS    Obtain Prevident 5000 prescription from the pharmacy.  Don't be surprised if it needs to be ordered.  Be sure to let the pharmacy know when you are close to needing a new refill for them to have it ready for you without interruption of Fluoride use.  The best time to use your Fluoride is before bedtime.  You must brush your teeth very well and floss before using the Fluoride in order to get the best use out of the Fluoride treatments.  Place Fluoride gel in the tray and spread gel around in the tray with your finger or cotton tip applicator.  Place the tray on your lower teeth and your upper teeth.  Make sure the trays are seated all the way.  Remember, they only fit one way on your teeth.  Insert for 5 full minutes.  At the end of the 5 minutes, take the trays out.  SPIT OUT excess.   Do NOT rinse your mouth!  Do NOT eat or drink after treatments for at least 30 minutes.  This is why the best time for your treatments is before bedtime.  Clean the inside of your Fluoride trays using COLD WATER and a toothbrush.  In order to keep your Trays from discoloring and free from odors, soak them overnight in denture cleaners such as Efferdent.  Do not use bleach or non denture products.  Store the trays in a safe dry place AWAY from any heat until your next treatment.  If anything happens to your Fluoride trays, or they don't fit as well after any dental work, please let us know as soon as possible.  

## 2019-05-23 NOTE — Progress Notes (Signed)
05/23/2019  Patient Name:   Eric Weaver Date of Birth:   Apr 10, 1971 Medical Record Number: [id number]  BP (!) 149/90 (BP Location: Right Arm)   Pulse 70   Temp 98.2 F (36.8 C)   Hannes Vicencio now presents for insertion of upper and lower fluoride trays and scatter protection devices. We had an extensive discussion on the risks, benefits, complications of various treatment options with special emphasis on the extraction of tooth numbers 1, 2, 16, and 31.  Due to the need for referral to an oral surgeon and complexity of dental extractions and the patient's desire to avoid extractions, patient has again indicated that he wishes to proceed without dental extractions prior to radiation therapy.  Patient is aware of the potential need for referral for hyperbaric oxygen therapy in the future if extractions are required.  Patient is aware of the need to maintain regular dental care after the radiation therapy for the rest of his life.  PROCEDURE: Appliances were tried in and adjusted as needed. [place] Trismus device was fabricated at 45 mm using 26 sticks. Postop instructions were provided and a written and verbal format concerning the use and care of appliances. All questions were answered. Patient to proceed to radiation oncology for simulation procedure today.   Patient is to call dental medicine if problems arise during radiation therapy, otherwise will be seen approximately 1 month after the last radiation therapy has been provided.     Lenn Cal, DDS

## 2019-05-24 ENCOUNTER — Encounter: Payer: Self-pay | Admitting: *Deleted

## 2019-05-24 ENCOUNTER — Inpatient Hospital Stay: Payer: BC Managed Care – PPO

## 2019-05-24 ENCOUNTER — Telehealth: Payer: Self-pay | Admitting: *Deleted

## 2019-05-24 ENCOUNTER — Other Ambulatory Visit: Payer: Self-pay

## 2019-05-24 ENCOUNTER — Encounter: Payer: Self-pay | Admitting: Radiation Oncology

## 2019-05-24 ENCOUNTER — Inpatient Hospital Stay: Payer: BC Managed Care – PPO | Attending: Hematology and Oncology | Admitting: Hematology and Oncology

## 2019-05-24 VITALS — BP 130/81 | HR 79 | Temp 98.7°F | Resp 18 | Wt 291.7 lb

## 2019-05-24 DIAGNOSIS — Z8249 Family history of ischemic heart disease and other diseases of the circulatory system: Secondary | ICD-10-CM | POA: Insufficient documentation

## 2019-05-24 DIAGNOSIS — I10 Essential (primary) hypertension: Secondary | ICD-10-CM | POA: Diagnosis not present

## 2019-05-24 DIAGNOSIS — C090 Malignant neoplasm of tonsillar fossa: Secondary | ICD-10-CM | POA: Diagnosis not present

## 2019-05-24 LAB — CBC WITH DIFFERENTIAL (CANCER CENTER ONLY)
Abs Immature Granulocytes: 0.02 10*3/uL (ref 0.00–0.07)
Basophils Absolute: 0.1 10*3/uL (ref 0.0–0.1)
Basophils Relative: 1 %
Eosinophils Absolute: 0.1 10*3/uL (ref 0.0–0.5)
Eosinophils Relative: 2 %
HCT: 38.5 % — ABNORMAL LOW (ref 39.0–52.0)
Hemoglobin: 12.1 g/dL — ABNORMAL LOW (ref 13.0–17.0)
Immature Granulocytes: 0 %
Lymphocytes Relative: 26 %
Lymphs Abs: 2.1 10*3/uL (ref 0.7–4.0)
MCH: 27.9 pg (ref 26.0–34.0)
MCHC: 31.4 g/dL (ref 30.0–36.0)
MCV: 88.9 fL (ref 80.0–100.0)
Monocytes Absolute: 0.6 10*3/uL (ref 0.1–1.0)
Monocytes Relative: 7 %
Neutro Abs: 5.2 10*3/uL (ref 1.7–7.7)
Neutrophils Relative %: 64 %
Platelet Count: 329 10*3/uL (ref 150–400)
RBC: 4.33 MIL/uL (ref 4.22–5.81)
RDW: 13.7 % (ref 11.5–15.5)
WBC Count: 8.2 10*3/uL (ref 4.0–10.5)
nRBC: 0 % (ref 0.0–0.2)

## 2019-05-24 LAB — CMP (CANCER CENTER ONLY)
ALT: 51 U/L — ABNORMAL HIGH (ref 0–44)
AST: 41 U/L (ref 15–41)
Albumin: 4.2 g/dL (ref 3.5–5.0)
Alkaline Phosphatase: 56 U/L (ref 38–126)
Anion gap: 7 (ref 5–15)
BUN: 15 mg/dL (ref 6–20)
CO2: 28 mmol/L (ref 22–32)
Calcium: 8.9 mg/dL (ref 8.9–10.3)
Chloride: 105 mmol/L (ref 98–111)
Creatinine: 0.87 mg/dL (ref 0.61–1.24)
GFR, Est AFR Am: >60 mL/min (ref >60)
GFR, Estimated: >60 mL/min (ref >60)
Glucose, Bld: 89 mg/dL (ref 70–99)
Potassium: 4.2 mmol/L (ref 3.5–5.1)
Sodium: 140 mmol/L (ref 135–145)
Total Bilirubin: 0.3 mg/dL (ref 0.3–1.2)
Total Protein: 7.6 g/dL (ref 6.5–8.1)

## 2019-05-24 NOTE — Progress Notes (Signed)
[place] Work  Clinical Social Work received referral from radiation oncology.  CSW contacted patient at home to offer support and assess for needs.  CSW left patient a detailed message with information on the support team and support services available at [facility].  CSW provided contact information and encouraged patient to call with questions or concerns.    Johnnye Lana, MSW, LCSW, OSW-C Clinical Social Worker [facility] [phone]

## 2019-05-24 NOTE — Progress Notes (Signed)
Head and Neck Cancer Simulation, IMRT treatment planning, and Special treatment procedure note   Outpatient  Diagnosis:    ICD-10-CM   1. Carcinoma of tonsillar fossa ([place])  C09.0    The patient was taken to the clinic for an IV start and after his IV was placed he went to the restroom.  He then returned to his clinic room and notified nursing that he had briefly passed out in the restroom.  He denied hitting his head or sustaining any injuries.  He reported that he has a tendency to feel faint and or pass out sometimes at the site of blood which had occurred when his IV was placed.  The patient was given some ginger ale and his blood glucose was checked.  No sign of hypoglycemia.  According to nursing the patient had initially been pale but then his coloring came back quickly.  I spoke with the patient and he was alert and oriented and in no acute distress.  No neurologic focalities or sign of injury.  Oropharyngeal exam notable for a bulky right tonsil tumor that is pushing on his uvula and crossing midline.  No obvious palpable masses in his neck.  He does have a generally full neck.  The patient was feeling much better by the time I saw him and was enthusiastic about proceeding with CT simulation.  He knows that in the future if he ever undergoes any blood draw or needle placement that he should warn nursing about his tendency for syncopal episodes.   All relevant records and images related to the planned course of therapy were reviewed.  The patient freely provided informed written consent to proceed with treatment after reviewing the details related to the planned course of therapy. The consent form was witnessed and verified by the simulation staff.  The patient was taken to the CT simulator and identity was confirmed.   The patient was laid in the supine position on the table. An Aquaplast head and shoulder mask was custom fitted to the patient's anatomy. High-resolution CT axial imaging was  obtained of the head and neck with contrast. I verified that the quality of the imaging is good for treatment planning. 1 Medically Necessary Treatment Device was fabricated and supervised by me: Aquaplast mask.  Treatment planning note I plan to treat the patient with IMRT. I plan to treat the patient's tumor and bilateral neck nodes. I plan to treat to a total dose of 70 Gray in 35 fractions. Dose calculation was ordered from dosimetry.  IMRT planning Note  IMRT is medically necessary and an important modality to deliver adequate dose to the patient's at risk tissues while sparing the patient's normal structures, including the: esophagus, parotid tissue, mandible, brain stem, spinal cord, oral cavity, brachial plexus.  This justifies the use of IMRT in the patient's treatment.   Special Treatment Procedure Note:  The patient will be receiving chemotherapy concurrently. Chemotherapy heightens the risk of side effects. I have considered this during the patient's treatment planning process and will monitor the patient accordingly for side effects on a weekly basis. Concurrent chemotherapy increases the complexity of this patient's treatment and therefore this constitutes a special treatment procedure.  -----------------------------------  Eppie Gibson, MD

## 2019-05-24 NOTE — Telephone Encounter (Signed)
Received call from pt's wife, Luetta Nutting stating that pt has issues with IVs & lab draws & has h/o passing out.  Informed lab for today.

## 2019-05-24 NOTE — Progress Notes (Signed)
Oncology Nurse Navigator Documentation  To provide support, encouragement and care continuity, met with Eric Weaver during initial consult with Dr. Lorenso Courier. He voiced understanding of:  Value of chemotherapy as sensitizer to enhance efficacy of XRT.  Plan for weekly Cisplatin.  Scheduling of port and PEG placement prior to tmt start, coordination of PEG education with me prior to placement.  He indicated he has supervisor's support to stop working until he has completed/recovered from tmt.  He provided FMLA and Critical Illness paperwork for processing. I encouraged him to call me with questions/concerns moving forward.  He agreed to do so.  Gayleen Orem, RN, BSN Head & Neck Oncology Nurse [place] at [place] [phone]

## 2019-05-24 NOTE — Progress Notes (Signed)
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 CT scan performed showing 4.1 x 4.0 x 5.3 cm mass centered at the right palatine tonsil, highly concerning for primary head and neck malignancy, likely squamous cell carcinoma, additionally there were partially necrotic right level II and III adenopathy. 2) 04/26/2019: referred to ENT, underwent right tonsillar biopsy confirming invasive squamous cell cancer 3) 05/17/2019: PET CT scan showed hypermetabolic mass at right palantine tonsil and hypermetabolic right level 2 and 3 lymph nodes. There was also mildly hypermetabolic lymph node in the left level 2 neck.  4) 05/24/2019: establish care with Dr. Lorenso Courier   CHIEF COMPLAINTS/PURPOSE OF CONSULTATION:  Newly diagnosed Carcinoma of the Tonsillar Fossa  HISTORY OF PRESENTING ILLNESS:  Eric Weaver 48 y.o. male with medical history significant for GERD, depression, and HTN who presents for evaluation of newly diagnosed carcinoma of the tonsillar fossa.   On review of prior records Eric Weaver presented to the emergency department at [facility] on 04/23/2019 due to throat discomfort and bleeding.  He had a CT scan of the time which confirmed a large mass at the right Balentine tonsil highly concerning for primary head and neck malignancy.  3 days later he was seen by ENT at which time he underwent a right tonsillar biopsy confirming invasive squamous cell cancer p16 positive.  A  final staging PET/CT was performed on 05/17/2019 which showed hypermetabolic mass at the right Balentine tonsil as well as level 2 and 3 lymph nodes on the right side.  Also he was found to have a hypermetabolic level 2 neck lymph node on the left side.  He was discussed the multidisciplinary conference and referred to oncology for consideration of chemotherapy in addition to his radiation.  On discussion today Eric Weaver reports that he believes the symptoms began approximately 6 months ago when he had bleeding in the back of his mouth that he thought was some kind of postnasal drip.  He noted it was off-and-on he spit clots out at that time.  It resolved on its own without intervention.  He noted that this reoccurred in late November and he was bleeding so heavily that he was not able to lie flat to sleep.  He notes he was choking on his own blood at that time.  He went to the emergency department after having dark stools and vomiting blood.  He notes that after his emergency room visit that time the bleeding subsequently stopped.  He reports that he is an active person and walks approximately 3 to 5 miles per day.  He notes that he is a never smoker and that he does drink alcohol occasionally, though has not had any in the last 1 month.  He notes he otherwise feels well and that the lesion in the back of his mouth is not causing him any issues with swallowing.  He has not had to alter his diet.  He denies any changes in weight or continued dark tarry stools.  A full 10 point ROS was otherwise negative and  listed below.  MEDICAL HISTORY:  Past Medical History:  Diagnosis Date   Depression    Essential hypertension 08/02/2018   GERD (gastroesophageal reflux disease)    History of nephrolithiasis    Vitamin D deficiency     SURGICAL HISTORY: Past Surgical History:  Procedure Laterality Date   Biopsy of right tonsil Right 04/24/2019   Positive for squamous cell carcinoma    SOCIAL  HISTORY: Social History   Socioeconomic History   Marital status: Married    Spouse name: Not on file   Number of children: Not on file   Years of education: Not on file   Highest education level: Not on file  Occupational History   Not on file  Tobacco Use   Smoking status: Never Smoker   Smokeless tobacco: Never Used  Substance and Sexual Activity   Alcohol use: Yes    Comment: occasional   Drug use: No   Sexual activity: Not on file  Other Topics Concern   Not on file  Social History Narrative   Not on file   Social Determinants of Health   Financial Resource Strain:    Difficulty of Paying Living Expenses: Not on file  Food Insecurity:    Worried About Running Out of Food in the Last Year: Not on file   Ran Out of Food in the Last Year: Not on file  Transportation Needs:    Lack of Transportation (Medical): Not on file   Lack of Transportation (Non-Medical): Not on file  Physical Activity:    Days of Exercise per Week: Not on file   Minutes of Exercise per Session: Not on file  Stress:    Feeling of Stress : Not on file  Social Connections:    Frequency of Communication with Friends and Family: Not on file   Frequency of Social Gatherings with Friends and Family: Not on file   Attends Religious Services: Not on file   Active Member of Clubs or Organizations: Not on file   Attends Archivist Meetings: Not on file   Marital Status: Not on file  Intimate Partner Violence:    Fear of Current or Ex-Partner: Not on file   Emotionally Abused: Not on file   Physically Abused: Not on file   Sexually Abused: Not on file    FAMILY HISTORY: Family History  Problem Relation Age of Onset   Hypertension Mother    Heart attack Father     ALLERGIES:  has No Known Allergies.  MEDICATIONS:  Current Outpatient Medications  Medication Sig Dispense Refill   escitalopram (LEXAPRO) 20 MG tablet Take 1 tablet (20 mg total) by  mouth daily. (Needs to be seen before next refill) 30 tablet 0   esomeprazole (NEXIUM) 20 MG capsule Take by mouth daily.  5   sodium fluoride (PREVIDENT 5000 PLUS) 1.1 % CREA dental cream Apply to tooth brush. Brush teeth for 2 minutes. Spit out excess. So NOT swallow. Repeat nightly. 1 Tube prn   tadalafil (CIALIS) 5 MG tablet Take 5 mg by mouth daily as needed.     Vitamin D, Ergocalciferol, (DRISDOL) 1.25 MG (50000 UT) CAPS capsule TAKE 1 CAPSULE BY MOUTH WEEKLY 12 capsule 1   ipratropium (ATROVENT) 0.06 % nasal spray Place 2 sprays into both nostrils 4 (four) times daily. (Patient not taking: Reported on 05/17/2019) 15 mL 0   No current facility-administered medications for this visit.    REVIEW OF SYSTEMS:   Constitutional: ( - ) fevers, ( - )  chills , ( - ) night sweats Eyes: ( - ) blurriness of vision, ( - ) double vision, ( - ) watery eyes Ears, nose, mouth, throat, and face: ( - ) mucositis, ( - ) sore throat Respiratory: ( - ) cough, ( - ) dyspnea, ( - ) wheezes Cardiovascular: ( - ) palpitation, ( - ) chest discomfort, ( - ) lower extremity swelling Gastrointestinal:  ( - ) nausea, ( - ) heartburn, ( - ) change in bowel habits Skin: ( - ) abnormal skin rashes Lymphatics: ( - ) new lymphadenopathy, ( - ) easy bruising Neurological: ( - ) numbness, ( - ) tingling, ( - ) new weaknesses Behavioral/Psych: ( - ) mood change, ( - ) new changes  All other systems were reviewed with the patient and are negative.  PHYSICAL EXAMINATION: ECOG PERFORMANCE STATUS: 0 - Asymptomatic  Vitals:   05/24/19 1509  BP: 130/81  Pulse: 79  Resp: 18  Temp: 98.7 F (37.1 C)  SpO2: 99%   Filed Weights   05/24/19 1509  Weight: 291 lb 11.2 oz (132.3 kg)    GENERAL: well appearing middle aged Caucasian male in NAD  SKIN: skin color, texture, turgor are normal, no rashes or significant lesions EYES: conjunctiva are pink and non-injected, sclera clear OROPHARYNX: markedly enlarged  erythematous right tonsil LUNGS: clear to auscultation and percussion with normal breathing effort HEART: regular rate & rhythm and no murmurs and no lower extremity edema ABDOMEN: soft, non-tender, non-distended, normal bowel sounds Musculoskeletal: no cyanosis of digits and no clubbing  PSYCH: alert & oriented x 3, fluent speech NEURO: no focal motor/sensory deficits  LABORATORY DATA:  I have reviewed the data as listed Recent Results (from the past 2160 hour(s))  Group A Strep by PCR     Status: None   Collection Time: 04/23/19  5:10 AM   Specimen: Throat; Sterile Swab  Result Value Ref Range   Group A Strep by PCR NOT DETECTED NOT DETECTED    Comment: Performed at [facility], [address], [place], [place] [id number]  Basic metabolic panel     Status: Abnormal   Collection Time: 04/23/19  6:13 AM  Result Value Ref Range   Sodium 136 135 - 145 mmol/L   Potassium 4.5 3.5 - 5.1 mmol/L   Chloride 105 98 - 111 mmol/L   CO2 23 22 - 32 mmol/L   Glucose, Bld 136 (H) 70 - 99 mg/dL   BUN 27 (H) 6 - 20 mg/dL   Creatinine, Ser 0.68 0.61 - 1.24 mg/dL   Calcium 8.5 (L) 8.9 - 10.3 mg/dL   GFR calc non Af Amer >60 >60 mL/min   GFR calc Af Amer >60 >60 mL/min   Anion gap 8 5 - 15    Comment: Performed at [facility], [address], [place], [place] [address]  Protime-INR     Status: None   Collection Time: 04/23/19  6:13 AM  Result Value Ref Range   Prothrombin Time 13.1 11.4 - 15.2 seconds   INR 1.0 0.8 - 1.2    Comment: (NOTE) INR goal varies based on device and disease states. Performed at [facility], [address], [place], [place] [address]   CBC with Differential     Status: Abnormal   Collection Time: 04/23/19  6:14 AM  Result Value Ref Range   WBC 9.2 4.0 - 10.5 K/uL   RBC 4.32 4.22 - 5.81 MIL/uL   Hemoglobin 12.9 (L) 13.0 - 17.0  g/dL   HCT 39.4 39.0 - 52.0 %   MCV 91.2 80.0 - 100.0 fL   MCH 29.9 26.0 - 34.0 pg   MCHC 32.7 30.0 - 36.0 g/dL   RDW 13.0 11.5 - 15.5 %    Platelets 289 150 - 400 K/uL   nRBC 0.0 0.0 - 0.2 %   Neutrophils Relative % 79 %   Neutro Abs 7.2 1.7 - 7.7 K/uL   Lymphocytes Relative 17 %   Lymphs Abs 1.5 0.7 - 4.0 K/uL   Monocytes Relative 4 %   Monocytes Absolute 0.4 0.1 - 1.0 K/uL   Eosinophils Relative 0 %   Eosinophils Absolute 0.0 0.0 - 0.5 K/uL   Basophils Relative 0 %   Basophils Absolute 0.0 0.0 - 0.1 K/uL   Immature Granulocytes 0 %   Abs Immature Granulocytes 0.04 0.00 - 0.07 K/uL    Comment: Performed at [facility], [address] [place], [place] [address]  Glucose, capillary     Status: Abnormal   Collection Time: 05/17/19 11:11 AM  Result Value Ref Range   Glucose-Capillary 103 (H) 70 - 99 mg/dL  Glucose, capillary     Status: Abnormal   Collection Time: 05/23/19 10:58 AM  Result Value Ref Range   Glucose-Capillary 140 (H) 70 - 99 mg/dL  CMP (Cancer Center only)     Status: Abnormal   Collection Time: 05/24/19  4:05 PM  Result Value Ref Range   Sodium 140 135 - 145 mmol/L   Potassium 4.2 3.5 - 5.1 mmol/L   Chloride 105 98 - 111 mmol/L   CO2 28 22 - 32 mmol/L   Glucose, Bld 89 70 - 99 mg/dL   BUN 15 6 - 20 mg/dL   Creatinine 0.87 0.61 - 1.24 mg/dL   Calcium 8.9 8.9 - 10.3 mg/dL   Total Protein 7.6 6.5 - 8.1 g/dL   Albumin 4.2 3.5 - 5.0 g/dL   AST 41 15 - 41 U/L   ALT 51 (H) 0 - 44 U/L   Alkaline Phosphatase 56 38 - 126 U/L   Total Bilirubin 0.3 0.3 - 1.2 mg/dL   GFR, Est Non Af Am >60 >60 mL/min   GFR, Est AFR Am >60 >60 mL/min   Anion gap 7 5 - 15    Comment: Performed at [facility], [place] [address], [place], [place] [address]  CBC with Differential ([place] Only)     Status: Abnormal   Collection Time: 05/24/19  4:05 PM  Result Value Ref Range   WBC Count 8.2 4.0 - 10.5 K/uL   RBC 4.33 4.22 - 5.81 MIL/uL   Hemoglobin 12.1 (L) 13.0 - 17.0 g/dL   HCT 38.5 (L) 39.0 - 52.0 %   MCV 88.9 80.0 - 100.0 fL   MCH 27.9 26.0 - 34.0 pg   MCHC 31.4 30.0 - 36.0 g/dL    RDW 13.7 11.5 - 15.5 %   Platelet Count 329 150 - 400 K/uL   nRBC 0.0 0.0 - 0.2 %   Neutrophils Relative % 64 %   Neutro Abs 5.2 1.7 - 7.7 K/uL   Lymphocytes Relative 26 %   Lymphs Abs 2.1 0.7 - 4.0 K/uL   Monocytes Relative 7 %   Monocytes Absolute 0.6 0.1 - 1.0 K/uL   Eosinophils Relative 2 %   Eosinophils Absolute 0.1 0.0 - 0.5 K/uL   Basophils Relative 1 %   Basophils Absolute 0.1 0.0 - 0.1 K/uL  Immature Granulocytes 0 %   Abs Immature Granulocytes 0.02 0.00 - 0.07 K/uL    Comment: Performed at [facility], [place] [address] [place],  [address]    PATHOLOGY: ACCESSION NUMBER: [id number] RECEIVED: 04/26/2019 ORDERING PHYSICIAN: JEFRY Jerry Caras , MD PATIENT NAME: Eric Weaver, Eric Weaver SURGICAL PATHOLOGY REPORT  FINAL PATHOLOGIC DIAGNOSIS MICROSCOPIC EXAMINATION AND DIAGNOSIS  RIGHT TONSIL, BIOPSY: Invasive squamous cell carcinoma.  COMMENT:  Another pathologist within the department has reviewed this case and agrees with the diagnosis above. A p16 immunostain has been ordered and will be reported as an addendum.  I have personally reviewed the slides and/or other related materials referenced, and have edited the report as part of my pathologic assessment and final interpretation.  Electronically Signed Out By:  Garnette Czech , MD 04/28/2019 10:08:22  dtk/dtk  Specimen(s) Received  Right tonsilar mass  Clinical History Right tonsillar mass  Gross Description Received labeled patient name, medical record number, "right tonsil mass" are 4 tissue fragments, 1.3 x 1 x 0.3 cm in aggregate. Submitted entirely A1.  [facility] [place]lj  PROCEDURES/ADDENDA  ADDENDUM       Date Ordered: 05/01/2019    Date Reported: 05/01/2019 Addendum Diagnosis This addendum is being added to report the result of additional immunohistochemical stains performed on this specimen.  Immunohistochemistry is performed on block A1.  The controls are adequate and representative tissue is present for evaluation.  P16: Diffusely positive in the tumor cells.  "These tests were developed and their performance characteristics determined by [facility]. They have not been cleared or approved by the U.S. Food and Drug Administration. The FDA has determined that such clearance or approval is not necessary. These tests are used for clinical purposes. They should not be regarded as investigational or for research. This laboratory is certified under the [place] (CLIA) as qualified to perform high complexity clinical laboratory testing."  DTK/dtk  I have personally reviewed the slides and/or other related materials referenced, and have edited the report as part of my pathologic assessment and final interpretation.  Electronically Signed Out By:  Garnette Czech , MD 05/01/2019 11:20:41   RADIOGRAPHIC STUDIES: I have personally reviewed the radiological images as listed and agreed with the findings in the report: FDG avidity in right palatine tonsil and nodes on the right level 2/3 lymph nodes. FDG avid left neck lymph node.  NM PET Image Initial (PI) Skull Base To Thigh  Result Date: 05/17/2019 CLINICAL DATA:  Initial treatment strategy for head neck carcinoma. Tonsillar biopsy 04/24/2019 EXAM: NUCLEAR MEDICINE PET SKULL BASE TO THIGH TECHNIQUE: 14.2 mCi F-18 FDG was injected intravenously. Full-ring PET imaging was performed from the skull base to thigh after the radiotracer. CT data was obtained and used for attenuation correction and anatomic localization. Fasting blood glucose: 103 mg/dl COMPARISON:  Neck CT 04/23/2019 FINDINGS: Mediastinal blood pool activity: SUV max 3.2 Liver activity: SUV max NA NECK: Large hypermetabolic mass centered at the RIGHT palatine tonsil measuring 3.9 x 3.4 cm (image 33/) with  intense metabolic activity (SUV max 26.7). There is moderate hypermetabolic activity the LEFT palatine tonsil region with SUV max equal 8.2. No mass lesion identified. Hypermetabolic RIGHT level 2 lymph node along the anterior margin the sternocleidomastoid muscle measures 17 mm (image 38/) SUV max 11.2. Slightly more inferior, below the level of the hyoid bone, 10 mm RIGHT level 3 lymph node (image [id number]) is hypermetabolic SUV max 9.2.  Within the LEFT neck, single small lymph node LEFT level 2 node measuring 7 mm short axis image 35/4 has moderate metabolic activity for size (SUV max equal 2.8). Incidental CT findings: none CHEST: No hypermetabolic mediastinal or hilar nodes. No suspicious pulmonary nodules on the CT scan. Incidental CT findings: none ABDOMEN/PELVIS: No abnormal hypermetabolic activity within the liver, pancreas, adrenal glands, or spleen. No hypermetabolic lymph nodes in the abdomen or pelvis. Incidental CT findings: Low-attenuation liver consistent hepatic steatosis. SKELETON: Incidental CT findings: none IMPRESSION: 1. Large hypermetabolic mass centered in the RIGHT palatine tonsil consistent with primary head neck cancer. 2. Hypermetabolic nodal metastasis to a RIGHT level 2 and level 3 lymph node. 3. Indeterminate midline crossing of carcinoma with hypermetabolic tissue at the LEFT tonsil without clear lesion on CT. 4. Mildly hypermetabolic lymph node in the LEFT level 2 neck, cannot exclude nodal metastasis. Correlate with findings at the LEFT palatine tonsil. Electronically Signed   By: Suzy Bouchard M.D.   On: 05/17/2019 15:30    ASSESSMENT & PLAN Eric Weaver 48 y.o. male with medical history significant for GERD, depression, and HTN who presents for evaluation of newly diagnosed carcinoma of the tonsillar fossa.  After review of the pathology and the imaging studies I do agree that his findings are most consistent with a stage II T3 N2 M0 p16 positive head neck cancer.  As such  appropriate treatment would consist of chemo and radiation.  Eric Weaver is a physically fit healthy gentleman who is an excellent candidate for chemotherapy.  After review of the data and discussion with the patient my preference would be for the lower dose of cisplatin administered weekly as opposed to the 100 mg/m cisplatin offered every 3 weeks.  Data has shown similar overall survival with a decreased toxicity with the weekly dosing schedule (JNCI: Journal of the [employer], Volume 111, Issue 5, May 2019, Pages [phone]).  This was discussed with the patient who is in agreement that weekly dosing would be acceptable.  Furthermore we discussed the side effects to be expected with cisplatin including nausea, vomiting, diarrhea, kidney damage, myelosuppression, drop in blood counts, and possible neuropathy.  I also noted that we will provide him with medications to try to alleviate the side effects. We will also plan to have a G tube placed to ensure continued nutrition in the event PO intake becomes difficult or painful.   Overall I think Eric Weaver is an excellent candidate for chemoradiation for his head neck cancer.  We will plan to have him set up for port placement, G-tube placement, as well as chemotherapy education with anticipated start date of 06/06/2019.   # Carcinoma of the Tonsillar Fossa ( Stage II, T3N2M0 p16+) --agree with chemoradiation as treatment for his newly diagnosed cancer of the tonsillar fossa --my preference would be for 40mg /m2 cisplatin weekly with his radiation. Will likely require x 7 doses. Goal of at least 200mg /m2 during the course of his care.  --discussed the risks and benefits of chemotherapy in addition to his radiation. He was agreeable to the addition of chemotherapy --in preparation for treatment we will have a port and G-tube placed. Additionally we will request chemotherapy education.  --intended start date of 06/06/2019 for  chemoradiation. --baseline CMP/CBC today. Will assure he has a TSH drawn with his first set of labs.  --RTC 3 weeks into his chemotherapy treatment to assure he is tolerating it well.   #Symptom Management --no currently in any pain or  requiring pain medication --will provide the patient with an anti-nausea regimen consisting of zofran 8mg  q8H and compazine 10mg  q6H.  --G tube to be placed as above. Encourage continued PO intake through the course of his treatment.   Orders Placed This Encounter  Procedures   IR Perc Tun Perit Cath W/Port    Standing Status:   Future    Standing Expiration Date:   07/24/2020    Order Specific Question:   Reason for exam:    Answer:   cisplatin chemotherapy, increased lab draws.    Order Specific Question:   Preferred Imaging Location?    Answer:   [facility]   PR EGD PERCUTANEOUS PLACEMENT GASTROSTOMY TUBE   All questions were answered. The patient knows to call the clinic with any problems, questions or concerns.  A total of more than 60 minutes were spent on this encounter and over half of that time was spent on counseling and coordination of care as outlined above.   Ledell Peoples, MD Department of Hematology/Oncology [place] at [facility] Phone: [phone] Pager: [phone] Email: [email or web address]  05/25/2019 1:25 PM   Literature Support:  Gladis Riffle, Christinia Gully, Rolena Infante, Alena Bills, Tito Fojo, Charu Navesink, [place], [place], [place] Verdie Drown, Christine Ciunci, Roselie Awkward, Graylon Gunning [place], [place], Ronac Mamtani, Lamount Cohen, Cisplatin Every 3 Weeks Versus Weekly With Definitive Concurrent Radiotherapy for Squamous Cell Carcinoma of the Head and Neck, JNCI: [place], Volume 111, Issue 5, May 2019, Pages [phone], [email or web address] --In this large,  population-based study of [place] military veterans, [facility] was associated with similar survival to [facility] in the nonoperative definitive management of locally advanced HNSCC of the oral cavity, oropharynx, and hypopharynx/larynx. Brodnax was associated with statistically significantly more toxicity than LDC. Adoption of [place] may reduce toxicity burden while maintaining OS.

## 2019-05-25 ENCOUNTER — Other Ambulatory Visit: Payer: Self-pay | Admitting: Hematology and Oncology

## 2019-05-25 ENCOUNTER — Telehealth: Payer: Self-pay | Admitting: Hematology and Oncology

## 2019-05-25 DIAGNOSIS — Z510 Encounter for antineoplastic radiation therapy: Secondary | ICD-10-CM | POA: Diagnosis not present

## 2019-05-25 DIAGNOSIS — C090 Malignant neoplasm of tonsillar fossa: Secondary | ICD-10-CM | POA: Diagnosis not present

## 2019-05-25 NOTE — Telephone Encounter (Signed)
No los per 12/23.

## 2019-05-29 ENCOUNTER — Other Ambulatory Visit: Payer: Self-pay | Admitting: *Deleted

## 2019-05-29 DIAGNOSIS — C090 Malignant neoplasm of tonsillar fossa: Secondary | ICD-10-CM

## 2019-05-30 ENCOUNTER — Telehealth: Payer: Self-pay | Admitting: General Practice

## 2019-05-30 ENCOUNTER — Telehealth (HOSPITAL_COMMUNITY): Payer: Self-pay | Admitting: General Practice

## 2019-05-30 ENCOUNTER — Telehealth: Payer: Self-pay | Admitting: *Deleted

## 2019-05-30 NOTE — Telephone Encounter (Signed)
Error

## 2019-05-30 NOTE — Telephone Encounter (Signed)
[place] CSW Progress Notes  Third call to patient to offer support/resources as needed.  CSW [place] had left detailed VM re [place], undersigned offered help w support/resources as needed.  Will close referral at this time as we have been unable to reach patient.  Please reconsult as needed.  Edwyna Shell, LCSW Clinical Social Worker Phone:  [phone] Cell:  [phone]

## 2019-05-30 NOTE — Telephone Encounter (Signed)
Oncology Nurse Navigator Documentation  Spoke with patient's wife, informed her next Tuesday's Nutrition and PT appts will be rescheduled within a week or so as to not conflict with Tuesday's [id number] PEG and port placement.  She agreed they can arrive to Radiation Waiting at 9:00 prior to PEG/port placement.  Gayleen Orem, RN, BSN Head & Neck Oncology Nurse [place] at [place] [phone]

## 2019-05-31 ENCOUNTER — Telehealth: Payer: Self-pay | Admitting: *Deleted

## 2019-05-31 NOTE — Telephone Encounter (Signed)
Oncology Nurse Navigator Documentation  Returned call to pt's wife, provided clarification re pick-up of barium sulfate Sat s/p COVID-19 test.  Gayleen Orem, RN, BSN Head & Neck Oncology Nurse [place] at [place] [phone]

## 2019-06-03 ENCOUNTER — Other Ambulatory Visit (HOSPITAL_COMMUNITY)
Admission: RE | Admit: 2019-06-03 | Discharge: 2019-06-03 | Disposition: A | Discharge status: Home / Self Care | Payer: BC Managed Care – PPO | Source: Ambulatory Visit | Attending: Hematology and Oncology | Admitting: Hematology and Oncology

## 2019-06-03 DIAGNOSIS — F329 Major depressive disorder, single episode, unspecified: Secondary | ICD-10-CM | POA: Diagnosis not present

## 2019-06-03 DIAGNOSIS — K219 Gastro-esophageal reflux disease without esophagitis: Secondary | ICD-10-CM | POA: Diagnosis not present

## 2019-06-03 DIAGNOSIS — R1310 Dysphagia, unspecified: Secondary | ICD-10-CM | POA: Diagnosis not present

## 2019-06-03 DIAGNOSIS — I10 Essential (primary) hypertension: Secondary | ICD-10-CM | POA: Diagnosis not present

## 2019-06-03 DIAGNOSIS — Z20822 Contact with and (suspected) exposure to covid-19: Secondary | ICD-10-CM | POA: Diagnosis not present

## 2019-06-03 DIAGNOSIS — C090 Malignant neoplasm of tonsillar fossa: Secondary | ICD-10-CM | POA: Diagnosis not present

## 2019-06-03 DIAGNOSIS — E559 Vitamin D deficiency, unspecified: Secondary | ICD-10-CM | POA: Diagnosis not present

## 2019-06-03 LAB — SARS CORONAVIRUS 2 (TAT 6-24 HRS): SARS Coronavirus 2: NEGATIVE

## 2019-06-05 ENCOUNTER — Other Ambulatory Visit: Payer: Self-pay | Admitting: Physician Assistant

## 2019-06-06 ENCOUNTER — Ambulatory Visit (HOSPITAL_COMMUNITY)
Admission: RE | Admit: 2019-06-06 | Discharge: 2019-06-06 | Disposition: A | Discharge status: Home / Self Care | Payer: BC Managed Care – PPO | Source: Ambulatory Visit | Attending: Hematology and Oncology | Admitting: Hematology and Oncology

## 2019-06-06 ENCOUNTER — Ambulatory Visit: Payer: BC Managed Care – PPO | Admitting: Physical Therapy

## 2019-06-06 ENCOUNTER — Other Ambulatory Visit: Payer: Self-pay | Admitting: Hematology and Oncology

## 2019-06-06 ENCOUNTER — Encounter: Payer: Self-pay | Admitting: *Deleted

## 2019-06-06 ENCOUNTER — Other Ambulatory Visit: Payer: Self-pay

## 2019-06-06 ENCOUNTER — Ambulatory Visit: Payer: BC Managed Care – PPO | Admitting: Radiation Oncology

## 2019-06-06 ENCOUNTER — Encounter (HOSPITAL_COMMUNITY): Payer: Self-pay

## 2019-06-06 DIAGNOSIS — F329 Major depressive disorder, single episode, unspecified: Secondary | ICD-10-CM | POA: Insufficient documentation

## 2019-06-06 DIAGNOSIS — I10 Essential (primary) hypertension: Secondary | ICD-10-CM | POA: Insufficient documentation

## 2019-06-06 DIAGNOSIS — R1310 Dysphagia, unspecified: Secondary | ICD-10-CM | POA: Insufficient documentation

## 2019-06-06 DIAGNOSIS — C090 Malignant neoplasm of tonsillar fossa: Secondary | ICD-10-CM

## 2019-06-06 DIAGNOSIS — K219 Gastro-esophageal reflux disease without esophagitis: Secondary | ICD-10-CM | POA: Diagnosis not present

## 2019-06-06 DIAGNOSIS — Z5111 Encounter for antineoplastic chemotherapy: Secondary | ICD-10-CM | POA: Diagnosis not present

## 2019-06-06 DIAGNOSIS — Z20822 Contact with and (suspected) exposure to covid-19: Secondary | ICD-10-CM | POA: Insufficient documentation

## 2019-06-06 DIAGNOSIS — E559 Vitamin D deficiency, unspecified: Secondary | ICD-10-CM | POA: Insufficient documentation

## 2019-06-06 DIAGNOSIS — C099 Malignant neoplasm of tonsil, unspecified: Secondary | ICD-10-CM | POA: Diagnosis not present

## 2019-06-06 HISTORY — PX: IR GASTROSTOMY TUBE MOD SED: IMG625

## 2019-06-06 HISTORY — PX: IR IMAGING GUIDED PORT INSERTION: IMG5740

## 2019-06-06 LAB — PROTIME-INR
INR: 0.9 (ref 0.8–1.2)
Prothrombin Time: 11.7 seconds (ref 11.4–15.2)

## 2019-06-06 LAB — CBC
HCT: 45.2 % (ref 39.0–52.0)
Hemoglobin: 14.1 g/dL (ref 13.0–17.0)
MCH: 27.3 pg (ref 26.0–34.0)
MCHC: 31.2 g/dL (ref 30.0–36.0)
MCV: 87.6 fL (ref 80.0–100.0)
Platelets: 370 10*3/uL (ref 150–400)
RBC: 5.16 MIL/uL (ref 4.22–5.81)
RDW: 14 % (ref 11.5–15.5)
WBC: 8 10*3/uL (ref 4.0–10.5)
nRBC: 0 % (ref 0.0–0.2)

## 2019-06-06 MED ORDER — CEFAZOLIN SODIUM-DEXTROSE 2-4 GM/100ML-% IV SOLN
2.0000 g | Freq: Once | INTRAVENOUS | Status: AC
  Administered 2019-06-06: 11:00:00 2 g via INTRAVENOUS

## 2019-06-06 MED ORDER — MIDAZOLAM HCL 2 MG/2ML IJ SOLN
INTRAMUSCULAR | Status: AC
  Filled 2019-06-06: qty 2

## 2019-06-06 MED ORDER — LIDOCAINE HCL 1 % IJ SOLN
INTRAMUSCULAR | Status: AC
  Filled 2019-06-06: qty 40

## 2019-06-06 MED ORDER — IOHEXOL 300 MG/ML  SOLN: 50.0000 mL | Freq: Once | INTRAMUSCULAR | Status: DC | PRN

## 2019-06-06 MED ORDER — HYDROCODONE-ACETAMINOPHEN 5-325 MG PO TABS: 1.0000 | ORAL_TABLET | ORAL | Status: DC | PRN

## 2019-06-06 MED ORDER — MIDAZOLAM HCL 2 MG/2ML IJ SOLN
INTRAMUSCULAR | Status: AC | PRN
  Administered 2019-06-06 (×8): 1 mg via INTRAVENOUS

## 2019-06-06 MED ORDER — IOHEXOL 300 MG/ML  SOLN
50.0000 mL | Freq: Once | INTRAMUSCULAR | Status: AC | PRN
  Administered 2019-06-06: 10 mL

## 2019-06-06 MED ORDER — HYDROCODONE-ACETAMINOPHEN 5-325 MG PO TABS: 1.0000 | ORAL_TABLET | Freq: Four times a day (QID) | ORAL | 0 refills | Status: AC | PRN

## 2019-06-06 MED ORDER — FENTANYL CITRATE (PF) 100 MCG/2ML IJ SOLN
INTRAMUSCULAR | Status: AC
  Filled 2019-06-06: qty 4

## 2019-06-06 MED ORDER — HEPARIN SOD (PORK) LOCK FLUSH 100 UNIT/ML IV SOLN
INTRAVENOUS | Status: AC
  Filled 2019-06-06: qty 5

## 2019-06-06 MED ORDER — HYDROMORPHONE HCL 1 MG/ML IJ SOLN: 1.0000 mg | INTRAMUSCULAR | Status: DC | PRN

## 2019-06-06 MED ORDER — HEPARIN SOD (PORK) LOCK FLUSH 100 UNIT/ML IV SOLN
INTRAVENOUS | Status: AC | PRN
  Administered 2019-06-06: 500 [IU] via INTRAVENOUS

## 2019-06-06 MED ORDER — LIDOCAINE HCL 1 % IJ SOLN
INTRAMUSCULAR | Status: AC | PRN
  Administered 2019-06-06: 5 mL

## 2019-06-06 MED ORDER — LIDOCAINE-EPINEPHRINE (PF) 1 %-1:200000 IJ SOLN
INTRAMUSCULAR | Status: AC | PRN
  Administered 2019-06-06 (×2): 10 mL

## 2019-06-06 MED ORDER — CEFAZOLIN SODIUM-DEXTROSE 2-4 GM/100ML-% IV SOLN
INTRAVENOUS | Status: AC
  Filled 2019-06-06: qty 100

## 2019-06-06 MED ORDER — SODIUM CHLORIDE 0.9 % IV SOLN: INTRAVENOUS | Status: DC

## 2019-06-06 MED ORDER — FENTANYL CITRATE (PF) 100 MCG/2ML IJ SOLN
INTRAMUSCULAR | Status: AC | PRN
  Administered 2019-06-06 (×4): 50 ug via INTRAVENOUS

## 2019-06-06 MED ORDER — ONDANSETRON HCL 4 MG/2ML IJ SOLN: 4.0000 mg | INTRAMUSCULAR | Status: DC | PRN

## 2019-06-06 MED ORDER — MIDAZOLAM HCL 2 MG/2ML IJ SOLN
INTRAMUSCULAR | Status: AC
  Filled 2019-06-06: qty 6

## 2019-06-06 NOTE — Progress Notes (Signed)
Pt and his wife aware prescription at [facility]; also pt to have liquid diet for next 24 hours.

## 2019-06-06 NOTE — Discharge Instructions (Signed)
Gastrostomy Tube Home Guide, Adult A gastrostomy tube, or G-tube, is a tube that is inserted through the abdomen into the stomach. The tube is used to give feedings and medicines when a person is unable to eat and drink enough on his or her own. How to care for a G-tube Supplies needed  Saline solution or clean, warm water and soap.  Cotton swab or gauze.  Precut gauze bandage (dressing) and tape, if needed. Instructions 1. Wash your hands with soap and water. 2. If there is a dressing between the person's skin and the tube, remove it. 3. Check the area where the tube enters the skin. Check for problems such as: ? Redness. ? Swelling. ? Pus-like drainage. ? Extra skin growth. 4. Moisten the cotton swab with the saline solution or soap and water mixture. Gently clean around the insertion site. Remove any drainage or crusting. ? When the G-tube is first put in, a normal saline solution or water can be used to clean the skin. ? Mild soap and warm water can be used when the skin around the G-tube site has healed. 5. If there should be a dressing between the person's skin and the tube, apply it at this time. How to flush a G-tube Flush the G-tube regularly to keep it from clogging. Flush it before and after feedings and as often as told by the health care provider. Supplies needed  Purified or sterile water, warmed. If the person has a weak disease-fighting (immune) system, or if he or she has difficulty fighting off infections (is immunocompromised), use only sterile water. ? If you are unsure about the amount of chemical contaminants in purified or drinking water, use sterile water. ? To purify drinking water by boiling:  Boil water for at least 1 minute. Keep lid over water while it boils. Allow water to cool to room temperature before using.  60cc G-tube syringe. Instructions 1. Wash your hands with soap and water. 2. Draw up 30 mL of warm water in a syringe. 3. Connect the syringe  to the tube. 4. Slowly and gently push the water into the tube. G-tube problems and solutions  If the tube comes out: ? Cover the opening with a clean dressing and tape. ? Call a health care provider right away. ? A health care provider will need to put the tube back in within 4 hours.  If there is skin or scar tissue growing where the tube enters the skin: ? Keep the area clean and dry. ? Secure the tube with tape so that the tube does not move around too much. ? Call a health care provider.  If the tube gets clogged: ? Slowly push warm water into the tube with a large syringe. ? Do not force the fluid into the tube or push an object into the tube. ? If you are not able to unclog the tube, call a health care provider right away. Follow these instructions at home: Feedings  Give feedings at room temperature.  Cover and place unused feedings in the refrigerator.  If feedings are continuous: ? Do not put more than 4 hours worth of feedings in the feeding bag. ? Stop the feedings when you need to give medicine or flush the tube. Be sure to restart the feedings. ? Make sure the person's head is above his or her stomach (upright position). This will prevent choking and discomfort.  Replace feeding bags and syringes as told by the health care provider.  Make  sure the person is in the right position during and after feedings: ? During feedings, the person's position should be in the upright position. ? After a noncontinuous feeding (bolus feeding), have the person stay in the upright position for 1 hour. General instructions  Only use syringes made for G-tubes.  Do not pull or put tension on the tube.  Clamp the tube before removing the cap or disconnecting a syringe.  Measure the length of the G-tube every day from the insertion site to the end of the tube.  If the person's G-tube has a balloon, check the fluid in the balloon every week. The amount of fluid that should be in  the balloon can be found in the manufacturer's specifications.  Make sure the person takes care of his or her oral health, such as by brushing his or her teeth.  Remove excess air from the G-tube as told by the person's health care provider. This is called "venting."  Keep the area where the tube enters the skin clean and dry.  Do not push feedings, medicines, or flushes rapidly. Contact a health care provider if:  The person with the tube has any of these problems: ? Constipation. ? Fever.  There is a large amount of fluid or mucus-like liquid leaking from the tube.  Skin or scar tissue appears to be growing where the tube enters the skin.  The length of tube from the insertion site to the G-tube gets longer. Get help right away if:  The person with the tube has any of these problems: ? Severe abdominal pain. ? Severe tenderness. ? Severe bloating. ? Nausea. ? Vomiting. ? Trouble breathing. ? Shortness of breath.  Any of these problems happen in the area where the tube enters the skin: ? Redness, irritation, swelling, or soreness. ? Pus-like discharge. ? A bad smell.  The tube is clogged and cannot be flushed.  The tube comes out. Summary  A gastrostomy tube, or G-tube, is a tube that is inserted through the abdomen into the stomach. The tube is used to give feedings and medicines when a person is unable to eat and drink enough on his or her own.  Check and clean the insertion site daily as told by the person's health care provider.  Flush the G-tube regularly to keep it from clogging. Flush it before and after feedings and as often as told by the person's health care provider.  Keep the area where the tube enters the skin clean and dry. This information is not intended to replace advice given to you by your health care provider. Make sure you discuss any questions you have with your health care provider. Document Revised: 04/30/2017 Document Reviewed:  07/13/2016 Elsevier Patient Education  [place] An implanted port is a device that is placed under the skin. It is usually placed in the chest. The device can be used to give IV medicine, to take blood, or for dialysis. You may have an implanted port if:  You need IV medicine that would be irritating to the small veins in your hands or arms.  You need IV medicines, such as antibiotics, for a long period of time.  You need IV nutrition for a long period of time.  You need dialysis. Having a port means that your health care provider will not need to use the veins in your arms for these procedures. You may have fewer limitations when using a port than you would  if you used other types of long-term IVs, and you will likely be able to return to normal activities after your incision heals. An implanted port has two main parts:  Reservoir. The reservoir is the part where a needle is inserted to give medicines or draw blood. The reservoir is round. After it is placed, it appears as a small, raised area under your skin.  Catheter. The catheter is a thin, flexible tube that connects the reservoir to a vein. Medicine that is inserted into the reservoir goes into the catheter and then into the vein. How is my port accessed? To access your port:  A numbing cream may be placed on the skin over the port site.  Your health care provider will put on a mask and sterile gloves.  The skin over your port will be cleaned carefully with a germ-killing soap and allowed to dry.  Your health care provider will gently pinch the port and insert a needle into it.  Your health care provider will check for a blood return to make sure the port is in the vein and is not clogged.  If your port needs to remain accessed to get medicine continuously (constant infusion), your health care provider will place a clear bandage (dressing) over the needle site. The dressing and needle will  need to be changed every week, or as told by your health care provider. What is flushing? Flushing helps keep the port from getting clogged. Follow instructions from your health care provider about how and when to flush the port. Ports are usually flushed with saline solution or a medicine called heparin. The need for flushing will depend on how the port is used:  If the port is only used from time to time to give medicines or draw blood, the port may need to be flushed: ? Before and after medicines have been given. ? Before and after blood has been drawn. ? As part of routine maintenance. Flushing may be recommended every 4-6 weeks.  If a constant infusion is running, the port may not need to be flushed.  Throw away any syringes in a disposal container that is meant for sharp items (sharps container). You can buy a sharps container from a pharmacy, or you can make one by using an empty hard plastic bottle with a cover. How long will my port stay implanted? The port can stay in for as long as your health care provider thinks it is needed. When it is time for the port to come out, a surgery will be done to remove it. The surgery will be similar to the procedure that was done to put the port in. Follow these instructions at home:   Flush your port as told by your health care provider.  If you need an infusion over several days, follow instructions from your health care provider about how to take care of your port site. Make sure you: ? Wash your hands with soap and water before you change your dressing. If soap and water are not available, use alcohol-based hand sanitizer. ? Change your dressing as told by your health care provider. ? Place any used dressings or infusion bags into a plastic bag. Throw that bag in the trash. ? Keep the dressing that covers the needle clean and dry. Do not get it wet. ? Do not use scissors or sharp objects near the tube. ? Keep the tube clamped, unless it is  being used.  Check your port site every day  for signs of infection. Check for: ? Redness, swelling, or pain. ? Fluid or blood. ? Pus or a bad smell.  Protect the skin around the port site. ? Avoid wearing bra straps that rub or irritate the site. ? Protect the skin around your port from seat belts. Place a soft pad over your chest if needed.  Bathe or shower as told by your health care provider. The site may get wet as long as you are not actively receiving an infusion.  Return to your normal activities as told by your health care provider. Ask your health care provider what activities are safe for you.  Carry a medical alert card or wear a medical alert bracelet at all times. This will let health care providers know that you have an implanted port in case of an emergency. Get help right away if:  You have redness, swelling, or pain at the port site.  You have fluid or blood coming from your port site.  You have pus or a bad smell coming from the port site.  You have a fever. Summary  Implanted ports are usually placed in the chest for long-term IV access.  Follow instructions from your health care provider about flushing the port and changing bandages (dressings).  Take care of the area around your port by avoiding clothing that puts pressure on the area, and by watching for signs of infection.  Protect the skin around your port from seat belts. Place a soft pad over your chest if needed.  Get help right away if you have a fever or you have redness, swelling, pain, drainage, or a bad smell at the port site. This information is not intended to replace advice given to you by your health care provider. Make sure you discuss any questions you have with your health care provider. Document Revised: 09/09/2018 Document Reviewed: 06/20/2016 Elsevier Patient Education  [place]. Moderate Conscious Sedation, Adult, Care After These instructions provide you with information  about caring for yourself after your procedure. Your health care provider may also give you more specific instructions. Your treatment has been planned according to current medical practices, but problems sometimes occur. Call your health care provider if you have any problems or questions after your procedure. What can I expect after the procedure? After your procedure, it is common:  To feel sleepy for several hours.  To feel clumsy and have poor balance for several hours.  To have poor judgment for several hours.  To vomit if you eat too soon. Follow these instructions at home: For at least 24 hours after the procedure:   Do not: ? Participate in activities where you could fall or become injured. ? Drive. ? Use heavy machinery. ? Drink alcohol. ? Take sleeping pills or medicines that cause drowsiness. ? Make important decisions or sign legal documents. ? Take care of children on your own.  Rest. Eating and drinking  Follow the diet recommended by your health care provider.  If you vomit: ? Drink water, juice, or soup when you can drink without vomiting. ? Make sure you have little or no nausea before eating solid foods. General instructions  Have a responsible adult stay with you until you are awake and alert.  Take over-the-counter and prescription medicines only as told by your health care provider.  If you smoke, do not smoke without supervision.  Keep all follow-up visits as told by your health care provider. This is important. Contact a health care provider  if:  You keep feeling nauseous or you keep vomiting.  You feel light-headed.  You develop a rash.  You have a fever. Get help right away if:  You have trouble breathing. This information is not intended to replace advice given to you by your health care provider. Make sure you discuss any questions you have with your health care provider. Document Revised: 04/30/2017 Document Reviewed:  09/07/2015 Elsevier Patient Education  2020 [employer].

## 2019-06-06 NOTE — Progress Notes (Signed)
Patient ID: Eric Weaver, male   DOB: 01-Nov-1970, 49 y.o.   MRN: [id number] Electronic prescription for Norco 5/325, #20, no refills, 1-2 tabs q 6 hrs for moderate pain sent to pt's pharmacy (s/p gastrostomy tube placement today)

## 2019-06-06 NOTE — Procedures (Signed)
Interventional Radiology Procedure Note  Procedure: Single Lumen Power Port Placement    Access:  Right IJ vein.  Findings: Catheter tip positioned at SVC/RA junction. Port is ready for immediate use.   Complications: None  EBL: < 10 mL  Recommendations:  - Ok to shower in 24 hours - Do not submerge for 7 days - Routine line care   Benen Weida T. Martia Dalby, M.D Pager:  [phone]   

## 2019-06-06 NOTE — H&P (Signed)
Chief Complaint: SCC of right tonsilar  Referring Physician(s): Dorsey,John T IV  Supervising Physician: Aletta Edouard  Patient Status: [facility] - Out-pt  History of Present Illness: Eric Weaver is a 49 y.o. male History of SCC of right tonsillar . Team is requesting portacath placement for chemotherapy access and gastrostomy tube placement for anticipated treatment related dysphagia  Past Medical History:  Diagnosis Date  . Depression   . Essential hypertension 08/02/2018  . GERD (gastroesophageal reflux disease)   . History of nephrolithiasis   . Vitamin D deficiency     Past Surgical History:  Procedure Laterality Date  . Biopsy of right tonsil Right 04/24/2019   Positive for squamous cell carcinoma    Allergies: Patient has no known allergies.  Medications: Prior to Admission medications   Medication Sig Start Date End Date Taking? Authorizing Provider  escitalopram (LEXAPRO) 20 MG tablet Take 1 tablet (20 mg total) by mouth daily. (Needs to be seen before next refill) 04/14/19   [name], DO  esomeprazole (NEXIUM) 20 MG capsule Take by mouth daily. 11/15/17   [provider]  ipratropium (ATROVENT) 0.06 % nasal spray Place 2 sprays into both nostrils 4 (four) times daily. Patient not taking: Reported on 05/17/2019 10/08/18   Tereasa Coop, PA-C  sodium fluoride (PREVIDENT 5000 PLUS) 1.1 % CREA dental cream Apply to tooth brush. Brush teeth for 2 minutes. Spit out excess. So NOT swallow. Repeat nightly. 05/17/19   Lenn Cal, DDS  tadalafil (CIALIS) 5 MG tablet Take 5 mg by mouth daily as needed.    [provider]  Vitamin D, Ergocalciferol, (DRISDOL) 1.25 MG (50000 UT) CAPS capsule TAKE 1 CAPSULE BY MOUTH WEEKLY 01/18/19   [name], DO     Family History  Problem Relation Age of Onset  . Hypertension Mother   . Heart attack Father     Social History   Socioeconomic History  . Marital status: Married   Spouse name: Not on file  . Number of children: Not on file  . Years of education: Not on file  . Highest education level: Not on file  Occupational History  . Not on file  Tobacco Use  . Smoking status: Never Smoker  . Smokeless tobacco: Never Used  Substance and Sexual Activity  . Alcohol use: Yes    Comment: occasional  . Drug use: No  . Sexual activity: Not on file  Other Topics Concern  . Not on file  Social History Narrative  . Not on file   Social Determinants of Health   Financial Resource Strain:   . Difficulty of Paying Living Expenses: Not on file  Food Insecurity:   . Worried About Charity fundraiser in the Last Year: Not on file  . Ran Out of Food in the Last Year: Not on file  Transportation Needs:   . Lack of Transportation (Medical): Not on file  . Lack of Transportation (Non-Medical): Not on file  Physical Activity:   . Days of Exercise per Week: Not on file  . Minutes of Exercise per Session: Not on file  Stress:   . Feeling of Stress : Not on file  Social Connections:   . Frequency of Communication with Friends and Family: Not on file  . Frequency of Social Gatherings with Friends and Family: Not on file  . Attends Religious Services: Not on file  . Active Member of Clubs or Organizations: Not on file  . Attends Club  or Organization Meetings: Not on file  . Marital Status: Not on file     Review of Systems: A 12 point ROS discussed and pertinent positives are indicated in the HPI above.  All other systems are negative.  Review of Systems  Constitutional: Negative for fever.  HENT: Positive for congestion.   Respiratory: Negative for cough and shortness of breath.   Cardiovascular: Negative for chest pain.  Gastrointestinal: Positive for diarrhea ( related to barium. self resolved). Negative for abdominal pain.  Neurological: Negative for headaches.  Psychiatric/Behavioral: Negative for behavioral problems and confusion.    Vital Signs: BP  (!) 136/97 (BP Location: Right Arm)   Pulse 86   Temp 98.3 F (36.8 C) (Oral)   Resp 18   SpO2 98%   Physical Exam Vitals and nursing note reviewed.  Constitutional:      Appearance: He is well-developed.  HENT:     Head: Normocephalic.  Cardiovascular:     Rate and Rhythm: Normal rate and regular rhythm.     Heart sounds: Normal heart sounds.  Pulmonary:     Effort: Pulmonary effort is normal.  Abdominal:     General: Bowel sounds are normal. There is no distension.     Palpations: Abdomen is soft.     Tenderness: There is no abdominal tenderness.  Musculoskeletal:        General: Normal range of motion.     Cervical back: Normal range of motion.  Skin:    General: Skin is dry.  Neurological:     Mental Status: He is alert and oriented to person, place, and time.     Imaging: NM PET Image Initial (PI) Skull Base To Thigh  Result Date: 05/17/2019 CLINICAL DATA:  Initial treatment strategy for head neck carcinoma. Tonsillar biopsy 04/24/2019 EXAM: NUCLEAR MEDICINE PET SKULL BASE TO THIGH TECHNIQUE: 14.2 mCi F-18 FDG was injected intravenously. Full-ring PET imaging was performed from the skull base to thigh after the radiotracer. CT data was obtained and used for attenuation correction and anatomic localization. Fasting blood glucose: 103 mg/dl COMPARISON:  Neck CT 04/23/2019 FINDINGS: Mediastinal blood pool activity: SUV max 3.2 Liver activity: SUV max NA NECK: Large hypermetabolic mass centered at the RIGHT palatine tonsil measuring 3.9 x 3.4 cm (image 33/) with intense metabolic activity (SUV max 26.7). There is moderate hypermetabolic activity the LEFT palatine tonsil region with SUV max equal 8.2. No mass lesion identified. Hypermetabolic RIGHT level 2 lymph node along the anterior margin the sternocleidomastoid muscle measures 17 mm (image 38/) SUV max 11.2. Slightly more inferior, below the level of the hyoid bone, 10 mm RIGHT level 3 lymph node (image 43//4) is  hypermetabolic SUV max 9.2. Within the LEFT neck, single small lymph node LEFT level 2 node measuring 7 mm short axis image 35/4 has moderate metabolic activity for size (SUV max equal 2.8). Incidental CT findings: none CHEST: No hypermetabolic mediastinal or hilar nodes. No suspicious pulmonary nodules on the CT scan. Incidental CT findings: none ABDOMEN/PELVIS: No abnormal hypermetabolic activity within the liver, pancreas, adrenal glands, or spleen. No hypermetabolic lymph nodes in the abdomen or pelvis. Incidental CT findings: Low-attenuation liver consistent hepatic steatosis. SKELETON: Incidental CT findings: none IMPRESSION: 1. Large hypermetabolic mass centered in the RIGHT palatine tonsil consistent with primary head neck cancer. 2. Hypermetabolic nodal metastasis to a RIGHT level 2 and level 3 lymph node. 3. Indeterminate midline crossing of carcinoma with hypermetabolic tissue at the LEFT tonsil without clear lesion on CT. 4.  Mildly hypermetabolic lymph node in the LEFT level 2 neck, cannot exclude nodal metastasis. Correlate with findings at the LEFT palatine tonsil. Electronically Signed   By: Suzy Bouchard M.D.   On: 05/17/2019 15:30    Labs:  CBC: Recent Labs    07/09/18 1637 04/23/19 0614 05/24/19 1605 06/06/19 1025  WBC 17.0* 9.2 8.2 8.0  HGB 14.1 12.9* 12.1* 14.1  HCT 43.1 39.4 38.5* 45.2  PLT 350 289 329 370    COAGS: Recent Labs    04/23/19 0613 06/06/19 1025  INR 1.0 0.9    BMP: Recent Labs    07/09/18 1637 08/06/18 0946 04/23/19 0613 05/24/19 1605  NA 138 141 136 140  K 4.3 5.0 4.5 4.2  CL 105 103 105 105  CO2 22 23 23 28   GLUCOSE 163* 111* 136* 89  BUN 19 12 27* 15  CALCIUM 9.4 10.0 8.5* 8.9  CREATININE 1.37* 1.21 0.68 0.87  GFRNONAA >60 70 >60 >60  GFRAA >60 81 >60 >60    LIVER FUNCTION TESTS: Recent Labs    08/06/18 0946 05/24/19 1605  BILITOT 0.5 0.3  AST 41* 41  ALT 50* 51*  ALKPHOS 58 56  PROT 7.7 7.6  ALBUMIN 4.6 4.2    Assessment and Plan:  49 y.o, male outpatient. History of SCC of right tonsillar . Team is requesting portacath placement for chemotherapy access and gastrostomy tube placement for anticipated treatment related dysphagia.  Pertinent Imaging 12.16.20 - PET shows stomach in accessible position for percutaneous access  Pertinent IR History none  Pertinent Allergies  All labs and medications are within acceptable parameters  Risks and benefits of image guided port-a-catheter placement was discussed with the patient including, but not limited to bleeding, infection, pneumothorax, or fibrin sheath development and need for additional procedures.  Risks and benefits image guided gastrostomy tube placement was discussed with the patient including, but not limited to the need for a barium enema during the procedure, bleeding, infection, peritonitis and/or damage to adjacent structures.  All of the patient's questions were answered, patient is agreeable to proceed.  Consent signed and in chart.   Thank you for this interesting consult.  I greatly enjoyed meeting Eric Weaver and look forward to participating in their care.  A copy of this report was sent to the requesting provider on this date.  Electronically Signed: Avel Peace, NP 06/06/2019, 11:01 AM   I spent a total of40 Minutes   in face to face in clinical consultation, greater than 50% of which was counseling/coordinating care for portacath placement and gastrostomy tube placement

## 2019-06-06 NOTE — Procedures (Signed)
Interventional Radiology Procedure Note  Procedure: Percutaneous gastrostomy tube placement  Complications: None  Estimated Blood Loss: < 10 mL  Findings: 20 Fr bumper retention gastrostomy tube placed with tip in body of stomach.   Eric Weaver, M.D Pager:  [phone]   

## 2019-06-06 NOTE — Sedation Documentation (Signed)
End port insertion

## 2019-06-07 ENCOUNTER — Ambulatory Visit
Admission: RE | Admit: 2019-06-07 | Discharge: 2019-06-07 | Disposition: A | Discharge status: Home / Self Care | Payer: BC Managed Care – PPO | Source: Ambulatory Visit | Attending: Radiation Oncology | Admitting: Radiation Oncology

## 2019-06-07 ENCOUNTER — Encounter: Payer: Self-pay | Admitting: *Deleted

## 2019-06-07 ENCOUNTER — Other Ambulatory Visit: Payer: Self-pay

## 2019-06-07 DIAGNOSIS — Z510 Encounter for antineoplastic radiation therapy: Secondary | ICD-10-CM | POA: Diagnosis not present

## 2019-06-07 DIAGNOSIS — C090 Malignant neoplasm of tonsillar fossa: Secondary | ICD-10-CM | POA: Insufficient documentation

## 2019-06-07 NOTE — Progress Notes (Signed)
Oncology Nurse Navigator Documentation  To provide support, encouragement and care continuity, met with Mr. Bergdoll prior to his initial  RT.    I reviewed the registration/arrival procedure for subsequent treatments.  Delivered completed FMLA and insurance forms.  Provided/reviewed Epic appt calendar, reviewed tomorrow's appts which include SLP s/p RT.  I encouraged him to call me with questions/concerns as tmts proceed.  Gayleen Orem, RN, BSN Head & Neck Oncology Nurse [place] at [place] [phone]

## 2019-06-08 ENCOUNTER — Ambulatory Visit: Payer: BC Managed Care – PPO | Attending: Radiation Oncology | Admitting: Physical Therapy

## 2019-06-08 ENCOUNTER — Other Ambulatory Visit: Payer: Self-pay

## 2019-06-08 ENCOUNTER — Ambulatory Visit: Payer: BC Managed Care – PPO

## 2019-06-08 ENCOUNTER — Encounter: Payer: Self-pay | Admitting: Physical Therapy

## 2019-06-08 ENCOUNTER — Ambulatory Visit
Admission: RE | Admit: 2019-06-08 | Discharge: 2019-06-08 | Disposition: A | Discharge status: Home / Self Care | Payer: BC Managed Care – PPO | Source: Ambulatory Visit | Attending: Radiation Oncology | Admitting: Radiation Oncology

## 2019-06-08 DIAGNOSIS — C090 Malignant neoplasm of tonsillar fossa: Secondary | ICD-10-CM | POA: Insufficient documentation

## 2019-06-08 DIAGNOSIS — Z510 Encounter for antineoplastic radiation therapy: Secondary | ICD-10-CM | POA: Diagnosis not present

## 2019-06-08 DIAGNOSIS — R293 Abnormal posture: Secondary | ICD-10-CM | POA: Diagnosis not present

## 2019-06-08 MED ORDER — SONAFINE EX EMUL
1.0000 "application " | Freq: Once | CUTANEOUS | Status: AC
  Administered 2019-06-08: 1 via TOPICAL

## 2019-06-08 NOTE — Therapy (Signed)
[place], [place], [address] Phone: [phone]   Fax:  [phone]  Physical Therapy Evaluation  Patient Details  Name: Eric Weaver MRN: [id number] Date of Birth: 23-May-1971 Referring Provider (PT): Reita May Date: 06/08/2019  PT End of Session - 06/08/19 0844    Visit Number  1    Number of Visits  1    PT Start Time  0808    PT Stop Time  0835    PT Time Calculation (min)  27 min    Activity Tolerance  Patient tolerated treatment well    Behavior During Therapy  [facility] for tasks assessed/performed       Past Medical History:  Diagnosis Date  . Depression   . Essential hypertension 08/02/2018  . GERD (gastroesophageal reflux disease)   . History of nephrolithiasis   . Vitamin D deficiency     Past Surgical History:  Procedure Laterality Date  . Biopsy of right tonsil Right 04/24/2019   Positive for squamous cell carcinoma  . IR GASTROSTOMY TUBE MOD SED  06/06/2019  . IR IMAGING GUIDED PORT INSERTION  06/06/2019    There were no vitals filed for this visit.   Subjective Assessment - 06/08/19 0809    Subjective  I am having some sharp pains from the feeding tube which was placed the day before yesterday but they don't last long. Radiation started yesterday. I am not sure when chemo starts.    Pertinent History  Stage 2 carcinoma of tonsillar fossa, GERD    Patient Stated Goals  to get all info from head and neck provider    Currently in Pain?  Yes    Pain Score  8     Pain Location  Abdomen    Pain Orientation  Left    Pain Descriptors / Indicators  Cramping    Pain Type  Surgical pain    Pain Onset  In the past 7 days    Pain Frequency  Intermittent    Aggravating Factors   laying on side    Pain Relieving Factors  relaxing    Effect of Pain on Daily Activities  none         [facility] PT Assessment - 06/08/19 0001      Assessment   Medical Diagnosis  carcinoma of tonsillar  fossa    Referring Provider (PT)  Isidore Moos    Onset Date/Surgical Date  04/26/19    Hand Dominance  Right    Prior Therapy  none      Precautions   Precautions  Other (comment)    Precaution Comments  active cancer      Restrictions   Weight Bearing Restrictions  No      Balance Screen   Has the patient fallen in the past 6 months  Yes    How many times?  2   from passing out, once after bleeding at home, 1x at [place] the patient had a decrease in activity level because of a fear of falling?   No    Is the patient reluctant to leave their home because of a fear of falling?   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    Leisure  pt has been walking 3-5 miles a day      Cognition   Overall Cognitive Status  Within Functional Limits for tasks assessed      Posture/Postural Control   Posture/Postural Control  Postural limitations    Postural Limitations  Rounded Shoulders;Forward head      ROM / Strength   AROM / PROM / Strength  AROM      AROM   Overall AROM Comments  shoulder ROM WFL    AROM Assessment Site  Cervical    Cervical Flexion  WFL    Cervical Extension  WFL    Cervical - Right Side Bend  WFL    Cervical - Left Side Bend  WFL    Cervical - Right Rotation  WFL    Cervical - Left Rotation  WFL      Transfers   Transfers  Sit to Stand    Comments  30 sec sit to stand test not done due to pain from feeding tube when changing positions        LYMPHEDEMA/ONCOLOGY QUESTIONNAIRE - 06/08/19 0830      Type   Cancer Type  carcinoma tonsillar fossa      Lymphedema Assessments   Lymphedema Assessments  Head and  Neck      Head and Neck   4 cm superior to sternal notch around neck  48.6 cm    6 cm superior to sternal notch around neck  49.6 cm    8 cm superior to sternal notch around neck  52 cm             Objective measurements completed on examination: See above findings.              PT Education - 06/08/19 0849    Education Details  Neck ROM, importance of posture when sitting, standing and lying down, deep breathing, walking program and importance of staying active throughout treatment, CURE article on staying active, "Why exercise?" flyer, lymphedema and PT info    Person(s) Educated  Patient    Methods  Explanation;Handout    Comprehension  Verbalized understanding              Head and Neck Clinic Goals - 06/08/19 0847      Patient will be able to verbalize understanding of a home exercise program for cervical range of motion, posture, and walking.    Status  Achieved      Patient will be able to verbalize understanding of proper sitting and standing posture.    Status  Achieved      Patient will be able to verbalize understanding of lymphedema risk and availability of treatment for this condition.    Status  Achieved         Plan - 06/08/19 0844    Clinical Impression Statement  Pt presents to PT today to get information about head and neck exercises, importance of exercises and lymphedema prior to beginning radiation and chemo for treatment of cacinoma of tonsillar fossa. Pt's current shoulder and cervical ROM are WFL. He is very active and currently walks 3-5 miles per day. Encouraged pt to continue with this throughout his treatments to help him tolerate his treatments better and to avoid fatigue. Educated pt about signs and symptoms of  lymphedema and to get a referral to PT as soon as he feels he is developing swelling. Pt currently does not require additional skilled PT visits at this time.    Stability/Clinical Decision Making   Stable/Uncomplicated    Clinical Decision Making  Low    PT Frequency  One time visit    PT Treatment/Interventions  ADLs/Self Care Home Management;Therapeutic exercise;Patient/family education    PT Next Visit Plan  1x visit    PT Home Exercise Plan  head and neck stretches, walking program    Consulted and Agree with Plan of Care  Patient       Patient will benefit from skilled therapeutic intervention in order to improve the following deficits and impairments:  Decreased knowledge of precautions, Postural dysfunction  Visit Diagnosis: Abnormal posture  Carcinoma of tonsillar fossa ([place])     Problem List Patient Active Problem List   Diagnosis Date Noted  . Carcinoma of tonsillar fossa ([place]) 05/12/2019  . Essential hypertension 08/02/2018  . Nephrolithiasis 08/02/2018  . Vitamin D deficiency 08/02/2018  . Morbid obesity ([place]) 08/02/2018  . Depression 12/16/2017  . Gastroesophageal reflux disease 02/11/2017    Allyson Sabal [facility] 06/08/2019, 8:50 AM  [place], [place], [address] Phone: [phone]   Fax:  [phone]  Name: Hamza Saldanha MRN: [id number] Date of Birth: 03/09/1971  Manus Gunning, PT 06/08/19 8:50 AM

## 2019-06-08 NOTE — Progress Notes (Signed)

## 2019-06-09 ENCOUNTER — Other Ambulatory Visit: Payer: Self-pay

## 2019-06-09 ENCOUNTER — Ambulatory Visit
Admission: RE | Admit: 2019-06-09 | Discharge: 2019-06-09 | Disposition: A | Discharge status: Home / Self Care | Payer: BC Managed Care – PPO | Source: Ambulatory Visit | Attending: Radiation Oncology | Admitting: Radiation Oncology

## 2019-06-09 DIAGNOSIS — C090 Malignant neoplasm of tonsillar fossa: Secondary | ICD-10-CM | POA: Diagnosis not present

## 2019-06-09 DIAGNOSIS — Z510 Encounter for antineoplastic radiation therapy: Secondary | ICD-10-CM | POA: Diagnosis not present

## 2019-06-09 NOTE — Progress Notes (Signed)
Oncology Nurse Navigator Documentation  Met with Eric Weaver to provide PEG/port education prior to placement later this morning.  He was accompanied by his wife.  Provided port educational handout, showed example, provided guidance for post-surgical dsg removal, site care.  . Using  PEG teaching device   and Teach Back, provided education for PEG use and care, including: hand hygiene, gravity bolus administration of daily water flushes and nutritional supplement, fluids and medications; care of tube insertion site including daily dressing change and cleaning; S&S of infection.   . Eric Weaver and his wife correctly verbalized procedures for and provided correct return demonstration of gravity administration of water, dressing change and cleaning.   . I provided written instructions for PEG flushing/dressing change in support of verbal instruction.   . I provided/described contents of Start of Care Bolus Feeding Kit.  He voiced understanding he is to start using Osmolite per guidance of Nutrition. Marland Kitchen He voiced understanding I will be available for ongoing PEG support.  Gayleen Orem, RN, BSN Head & Neck Oncology Nurse [place] at [place] [phone]

## 2019-06-12 ENCOUNTER — Telehealth: Payer: Self-pay | Admitting: *Deleted

## 2019-06-12 ENCOUNTER — Ambulatory Visit
Admission: RE | Admit: 2019-06-12 | Discharge: 2019-06-12 | Disposition: A | Discharge status: Home / Self Care | Payer: BC Managed Care – PPO | Source: Ambulatory Visit | Attending: Radiation Oncology | Admitting: Radiation Oncology

## 2019-06-12 ENCOUNTER — Other Ambulatory Visit: Payer: Self-pay

## 2019-06-12 ENCOUNTER — Encounter: Payer: BC Managed Care – PPO | Admitting: Nutrition

## 2019-06-12 ENCOUNTER — Other Ambulatory Visit: Payer: Self-pay | Admitting: Radiation Oncology

## 2019-06-12 DIAGNOSIS — C090 Malignant neoplasm of tonsillar fossa: Secondary | ICD-10-CM | POA: Diagnosis not present

## 2019-06-12 DIAGNOSIS — Z510 Encounter for antineoplastic radiation therapy: Secondary | ICD-10-CM | POA: Diagnosis not present

## 2019-06-12 MED ORDER — LIDOCAINE VISCOUS HCL 2 % MT SOLN: OROMUCOSAL | 4 refills | Status: DC

## 2019-06-12 NOTE — Telephone Encounter (Signed)
TCT patient. Spoke with him. Informed him that his Cisplatin was approved through his insurance company.  Advised that I would be sending high priority scheduling message to get his patient education, labs and treatment scheduled as soon as possible.  Pt voiced understanding of call and voiced appreciation of the call.  Dr. Lorenso Courier and Gayleen Orem, RN navigator made aware of the his as well

## 2019-06-13 ENCOUNTER — Ambulatory Visit
Admission: RE | Admit: 2019-06-13 | Discharge: 2019-06-13 | Disposition: A | Discharge status: Home / Self Care | Payer: BC Managed Care – PPO | Source: Ambulatory Visit | Attending: Radiation Oncology | Admitting: Radiation Oncology

## 2019-06-13 ENCOUNTER — Other Ambulatory Visit: Payer: Self-pay

## 2019-06-13 ENCOUNTER — Inpatient Hospital Stay: Payer: BC Managed Care – PPO | Attending: Hematology and Oncology

## 2019-06-13 DIAGNOSIS — Z510 Encounter for antineoplastic radiation therapy: Secondary | ICD-10-CM | POA: Diagnosis not present

## 2019-06-13 DIAGNOSIS — Z79899 Other long term (current) drug therapy: Secondary | ICD-10-CM | POA: Insufficient documentation

## 2019-06-13 DIAGNOSIS — C090 Malignant neoplasm of tonsillar fossa: Secondary | ICD-10-CM | POA: Insufficient documentation

## 2019-06-13 DIAGNOSIS — Z931 Gastrostomy status: Secondary | ICD-10-CM | POA: Insufficient documentation

## 2019-06-13 DIAGNOSIS — R070 Pain in throat: Secondary | ICD-10-CM | POA: Insufficient documentation

## 2019-06-13 DIAGNOSIS — F329 Major depressive disorder, single episode, unspecified: Secondary | ICD-10-CM | POA: Insufficient documentation

## 2019-06-13 DIAGNOSIS — Z923 Personal history of irradiation: Secondary | ICD-10-CM | POA: Insufficient documentation

## 2019-06-13 DIAGNOSIS — Z5111 Encounter for antineoplastic chemotherapy: Secondary | ICD-10-CM | POA: Insufficient documentation

## 2019-06-13 NOTE — Progress Notes (Signed)
Nutrition Assessment   Reason for Assessment:   New head and neck cancer patient   ASSESSMENT:  49 year old male with carcinoma of the tonsillar fossa.  Planning chemotherapy and radiation therapy.  Patient followed by Dr. Lorenso Courier and Dr Isidore Moos.  PEG placed on 06/06/19 (20 fr)  Met with patient in clinic for nutrition assessment.  Patient reports normal appetite. Has been nauseated in the am since having PEG tube placed on 06/06/19. Reports that [name], RN adjusted tube yesterday and feels better today.  Reports eating egg, bacon mid am yesterday and burger last night for dinner.  Denies dysphagia or diet alterations.  Patient reports that he has been flushing tube with water daily.     Medications: nexium, Vit D, fe sulfate and vit C   Labs: reviewed   Anthropometrics:   Height: 72 inches Weight: 291 lb 11.2 oz on 12/23 Noted 08/02/2018 286 lb 04/23/19 275 lb BMI: 39   Estimated Energy Needs  Kcals: 2900-3300 Protein: 121-162 g Fluid: > 2.9 L   NUTRITION DIAGNOSIS: Predicted suboptimal energy intake related to cancer diagnosis as evidenced by planned radiation and chemotherapy treatment and associated side effects   INTERVENTION:  Discussed importance of nutrition during treatment and role of RD in care. Discussed soft, moist protein foods and provided list for patient to review. Discussed importance of protein and including protein foods in diet.  Provided samples of oral nutrition supplements for patient to try along with coupons.  Provided contact information   MONITORING, EVALUATION, GOAL: Patient will consume adequate calories and and protein to maintain muscle mass during treatment   Next Visit: Wed, Jan 20th after radiation  Eric Weaver B. Eric Weaver, Eric Weaver, Eric Weaver Registered Dietitian [phone] (pager)

## 2019-06-14 ENCOUNTER — Inpatient Hospital Stay: Payer: BC Managed Care – PPO

## 2019-06-14 ENCOUNTER — Telehealth: Payer: Self-pay | Admitting: Hematology and Oncology

## 2019-06-14 ENCOUNTER — Other Ambulatory Visit: Payer: Self-pay | Admitting: Hematology and Oncology

## 2019-06-14 ENCOUNTER — Ambulatory Visit
Admission: RE | Admit: 2019-06-14 | Discharge: 2019-06-14 | Disposition: A | Discharge status: Home / Self Care | Payer: BC Managed Care – PPO | Source: Ambulatory Visit | Attending: Radiation Oncology | Admitting: Radiation Oncology

## 2019-06-14 ENCOUNTER — Other Ambulatory Visit: Payer: Self-pay

## 2019-06-14 DIAGNOSIS — Z510 Encounter for antineoplastic radiation therapy: Secondary | ICD-10-CM | POA: Diagnosis not present

## 2019-06-14 DIAGNOSIS — C090 Malignant neoplasm of tonsillar fossa: Secondary | ICD-10-CM | POA: Diagnosis not present

## 2019-06-14 MED ORDER — PROCHLORPERAZINE MALEATE 10 MG PO TABS: 10.0000 mg | ORAL_TABLET | Freq: Four times a day (QID) | ORAL | 1 refills | Status: DC | PRN

## 2019-06-14 MED ORDER — LIDOCAINE-PRILOCAINE 2.5-2.5 % EX CREA: 1.0000 "application " | TOPICAL_CREAM | CUTANEOUS | 2 refills | Status: DC | PRN

## 2019-06-14 MED ORDER — ONDANSETRON HCL 8 MG PO TABS: 8.0000 mg | ORAL_TABLET | Freq: Three times a day (TID) | ORAL | 1 refills | Status: DC | PRN

## 2019-06-14 NOTE — Telephone Encounter (Signed)
Scheduled appt per 11/3 sch message - pt to get an updated schedule next visit.   

## 2019-06-15 ENCOUNTER — Encounter: Payer: Self-pay | Admitting: Family Medicine

## 2019-06-15 ENCOUNTER — Other Ambulatory Visit: Payer: Self-pay

## 2019-06-15 ENCOUNTER — Inpatient Hospital Stay: Payer: BC Managed Care – PPO

## 2019-06-15 ENCOUNTER — Encounter: Payer: Self-pay | Admitting: Hematology and Oncology

## 2019-06-15 ENCOUNTER — Ambulatory Visit
Admission: RE | Admit: 2019-06-15 | Discharge: 2019-06-15 | Disposition: A | Discharge status: Home / Self Care | Payer: BC Managed Care – PPO | Source: Ambulatory Visit | Attending: Radiation Oncology | Admitting: Radiation Oncology

## 2019-06-15 DIAGNOSIS — Z923 Personal history of irradiation: Secondary | ICD-10-CM | POA: Diagnosis not present

## 2019-06-15 DIAGNOSIS — R070 Pain in throat: Secondary | ICD-10-CM | POA: Diagnosis not present

## 2019-06-15 DIAGNOSIS — C090 Malignant neoplasm of tonsillar fossa: Secondary | ICD-10-CM

## 2019-06-15 DIAGNOSIS — Z5111 Encounter for antineoplastic chemotherapy: Secondary | ICD-10-CM | POA: Diagnosis not present

## 2019-06-15 DIAGNOSIS — Z79899 Other long term (current) drug therapy: Secondary | ICD-10-CM | POA: Diagnosis not present

## 2019-06-15 DIAGNOSIS — Z510 Encounter for antineoplastic radiation therapy: Secondary | ICD-10-CM | POA: Diagnosis not present

## 2019-06-15 DIAGNOSIS — F329 Major depressive disorder, single episode, unspecified: Secondary | ICD-10-CM | POA: Diagnosis not present

## 2019-06-15 DIAGNOSIS — Z931 Gastrostomy status: Secondary | ICD-10-CM | POA: Diagnosis not present

## 2019-06-15 LAB — CBC WITH DIFFERENTIAL (CANCER CENTER ONLY)
Abs Immature Granulocytes: 0.03 10*3/uL (ref 0.00–0.07)
Basophils Absolute: 0.1 10*3/uL (ref 0.0–0.1)
Basophils Relative: 1 %
Eosinophils Absolute: 0.1 10*3/uL (ref 0.0–0.5)
Eosinophils Relative: 1 %
HCT: 42.9 % (ref 39.0–52.0)
Hemoglobin: 13.6 g/dL (ref 13.0–17.0)
Immature Granulocytes: 0 %
Lymphocytes Relative: 22 %
Lymphs Abs: 1.7 10*3/uL (ref 0.7–4.0)
MCH: 27.4 pg (ref 26.0–34.0)
MCHC: 31.7 g/dL (ref 30.0–36.0)
MCV: 86.5 fL (ref 80.0–100.0)
Monocytes Absolute: 0.5 10*3/uL (ref 0.1–1.0)
Monocytes Relative: 6 %
Neutro Abs: 5.3 10*3/uL (ref 1.7–7.7)
Neutrophils Relative %: 70 %
Platelet Count: 324 10*3/uL (ref 150–400)
RBC: 4.96 MIL/uL (ref 4.22–5.81)
RDW: 13.9 % (ref 11.5–15.5)
WBC Count: 7.6 10*3/uL (ref 4.0–10.5)
nRBC: 0 % (ref 0.0–0.2)

## 2019-06-15 LAB — CMP (CANCER CENTER ONLY)
ALT: 51 U/L — ABNORMAL HIGH (ref 0–44)
AST: 32 U/L (ref 15–41)
Albumin: 4.3 g/dL (ref 3.5–5.0)
Alkaline Phosphatase: 65 U/L (ref 38–126)
Anion gap: 10 (ref 5–15)
BUN: 19 mg/dL (ref 6–20)
CO2: 27 mmol/L (ref 22–32)
Calcium: 9 mg/dL (ref 8.9–10.3)
Chloride: 106 mmol/L (ref 98–111)
Creatinine: 1.08 mg/dL (ref 0.61–1.24)
GFR, Est AFR Am: >60 mL/min (ref >60)
GFR, Estimated: >60 mL/min (ref >60)
Glucose, Bld: 95 mg/dL (ref 70–99)
Potassium: 4.1 mmol/L (ref 3.5–5.1)
Sodium: 143 mmol/L (ref 135–145)
Total Bilirubin: 0.3 mg/dL (ref 0.3–1.2)
Total Protein: 8.3 g/dL — ABNORMAL HIGH (ref 6.5–8.1)

## 2019-06-15 NOTE — Progress Notes (Signed)
Met w/ pt to introduce myself as his Arboriculturist.  Unfortunately there aren't any foundations offering copay assistance for his Dx.  I offered the [place], went over what it covers and gave him the income requirement.  He stated he exceeds that amount so he doesn't qualify for the grant at this time.  I gave him my card in case his situation changes and would like to apply for the grant in the future.

## 2019-06-16 ENCOUNTER — Inpatient Hospital Stay: Payer: BC Managed Care – PPO

## 2019-06-16 ENCOUNTER — Encounter: Payer: Self-pay | Admitting: *Deleted

## 2019-06-16 ENCOUNTER — Other Ambulatory Visit: Payer: Self-pay | Admitting: Hematology and Oncology

## 2019-06-16 ENCOUNTER — Other Ambulatory Visit: Payer: BC Managed Care – PPO

## 2019-06-16 ENCOUNTER — Other Ambulatory Visit: Payer: Self-pay

## 2019-06-16 ENCOUNTER — Ambulatory Visit
Admission: RE | Admit: 2019-06-16 | Discharge: 2019-06-16 | Disposition: A | Discharge status: Home / Self Care | Payer: BC Managed Care – PPO | Source: Ambulatory Visit | Attending: Radiation Oncology | Admitting: Radiation Oncology

## 2019-06-16 ENCOUNTER — Other Ambulatory Visit: Payer: Self-pay | Admitting: *Deleted

## 2019-06-16 VITALS — BP 128/80 | HR 78 | Temp 98.5°F | Resp 16 | Ht 72.0 in | Wt 289.2 lb

## 2019-06-16 DIAGNOSIS — Z923 Personal history of irradiation: Secondary | ICD-10-CM | POA: Diagnosis not present

## 2019-06-16 DIAGNOSIS — C090 Malignant neoplasm of tonsillar fossa: Secondary | ICD-10-CM

## 2019-06-16 DIAGNOSIS — Z79899 Other long term (current) drug therapy: Secondary | ICD-10-CM | POA: Diagnosis not present

## 2019-06-16 DIAGNOSIS — Z5111 Encounter for antineoplastic chemotherapy: Secondary | ICD-10-CM | POA: Diagnosis not present

## 2019-06-16 DIAGNOSIS — Z510 Encounter for antineoplastic radiation therapy: Secondary | ICD-10-CM | POA: Diagnosis not present

## 2019-06-16 DIAGNOSIS — Z931 Gastrostomy status: Secondary | ICD-10-CM | POA: Diagnosis not present

## 2019-06-16 DIAGNOSIS — R070 Pain in throat: Secondary | ICD-10-CM | POA: Diagnosis not present

## 2019-06-16 DIAGNOSIS — F329 Major depressive disorder, single episode, unspecified: Secondary | ICD-10-CM | POA: Diagnosis not present

## 2019-06-16 MED ORDER — SODIUM CHLORIDE 0.9 % IV SOLN
100.0000 mg | Freq: Once | INTRAVENOUS | Status: AC
  Administered 2019-06-16: 100 mg via INTRAVENOUS
  Filled 2019-06-16: qty 100

## 2019-06-16 MED ORDER — PALONOSETRON HCL INJECTION 0.25 MG/5ML
INTRAVENOUS | Status: AC
  Filled 2019-06-16: qty 5

## 2019-06-16 MED ORDER — DEXAMETHASONE SODIUM PHOSPHATE 10 MG/ML IJ SOLN: 10.0000 mg | Freq: Once | INTRAMUSCULAR | Status: DC

## 2019-06-16 MED ORDER — POTASSIUM CHLORIDE 2 MEQ/ML IV SOLN
Freq: Once | INTRAVENOUS | Status: AC
  Filled 2019-06-16: qty 10

## 2019-06-16 MED ORDER — SODIUM CHLORIDE 0.9 % IV SOLN
150.0000 mg | Freq: Once | INTRAVENOUS | Status: AC
  Administered 2019-06-16: 150 mg via INTRAVENOUS
  Filled 2019-06-16: qty 150

## 2019-06-16 MED ORDER — SODIUM CHLORIDE 0.9 % IV SOLN: 10.0000 mg | Freq: Once | INTRAVENOUS | Status: DC

## 2019-06-16 MED ORDER — ESOMEPRAZOLE MAGNESIUM 20 MG PO CPDR: 20.0000 mg | DELAYED_RELEASE_CAPSULE | Freq: Every day | ORAL | 3 refills | Status: DC

## 2019-06-16 MED ORDER — PALONOSETRON HCL INJECTION 0.25 MG/5ML
0.2500 mg | Freq: Once | INTRAVENOUS | Status: AC
  Administered 2019-06-16: 0.25 mg via INTRAVENOUS

## 2019-06-16 MED ORDER — DEXAMETHASONE SODIUM PHOSPHATE 10 MG/ML IJ SOLN
INTRAMUSCULAR | Status: AC
  Filled 2019-06-16: qty 1

## 2019-06-16 MED ORDER — SODIUM CHLORIDE 0.9% FLUSH
10.0000 mL | INTRAVENOUS | Status: DC | PRN
  Administered 2019-06-16: 10 mL
  Filled 2019-06-16: qty 10

## 2019-06-16 MED ORDER — SODIUM CHLORIDE 0.9 % IV SOLN: 38.5000 mg/m2 | Freq: Once | INTRAVENOUS | Status: DC

## 2019-06-16 MED ORDER — SODIUM CHLORIDE 0.9 % IV SOLN
Freq: Once | INTRAVENOUS | Status: AC
  Filled 2019-06-16: qty 250

## 2019-06-16 MED ORDER — HEPARIN SOD (PORK) LOCK FLUSH 100 UNIT/ML IV SOLN
500.0000 [IU] | Freq: Once | INTRAVENOUS | Status: AC | PRN
  Administered 2019-06-16: 500 [IU]
  Filled 2019-06-16: qty 5

## 2019-06-16 NOTE — Patient Instructions (Signed)
COVID-19 Vaccine Information can be found at: [email or web address] For questions related to vaccine distribution or appointments, please email [email or web address] or call [phone].      [place] Discharge Instructions for Patients Receiving Chemotherapy  Today you received the following chemotherapy agents:  Cisplatin (Platinol)  To help prevent nausea and vomiting after your treatment, we encourage you to take your nausea medication as prescribed.   If you develop nausea and vomiting that is not controlled by your nausea medication, call the clinic.   BELOW ARE SYMPTOMS THAT SHOULD BE REPORTED IMMEDIATELY:  *FEVER GREATER THAN 100.5 F  *CHILLS WITH OR WITHOUT FEVER  NAUSEA AND VOMITING THAT IS NOT CONTROLLED WITH YOUR NAUSEA MEDICATION  *UNUSUAL SHORTNESS OF BREATH  *UNUSUAL BRUISING OR BLEEDING  TENDERNESS IN MOUTH AND THROAT WITH OR WITHOUT PRESENCE OF ULCERS  *URINARY PROBLEMS  *BOWEL PROBLEMS  UNUSUAL RASH Items with * indicate a potential emergency and should be followed up as soon as possible.  Feel free to call the clinic should you have any questions or concerns. The clinic phone number is [phone].  Please show the [place] at check-in to the Emergency Department and triage nurse.

## 2019-06-17 NOTE — Progress Notes (Signed)
Oncology Nurse Navigator Documentation  Met with Eric Weaver in Infusion where he was receiving initial Cisplatin.  He denied concerns at this point in infusion. In follow-up to call from his wife and her concerns re color of discharge at PEG insertion site, examined dsg and site.  Appeared WNL.  He stated there was more discharge on dsg earlier when it was changed.  I suggested he not conduct dsg change next Monday morning and I would evaluate site further after RT.  He agreed to plan.   Gayleen Orem, RN, BSN Head & Neck Oncology Nurse [place] at [place] [phone]

## 2019-06-19 ENCOUNTER — Ambulatory Visit
Admission: RE | Admit: 2019-06-19 | Discharge: 2019-06-19 | Disposition: A | Discharge status: Home / Self Care | Payer: BC Managed Care – PPO | Source: Ambulatory Visit | Attending: Radiation Oncology | Admitting: Radiation Oncology

## 2019-06-19 ENCOUNTER — Other Ambulatory Visit: Payer: Self-pay | Admitting: Radiation Oncology

## 2019-06-19 ENCOUNTER — Telehealth: Payer: Self-pay | Admitting: *Deleted

## 2019-06-19 ENCOUNTER — Other Ambulatory Visit: Payer: Self-pay

## 2019-06-19 DIAGNOSIS — Z510 Encounter for antineoplastic radiation therapy: Secondary | ICD-10-CM | POA: Diagnosis not present

## 2019-06-19 DIAGNOSIS — C090 Malignant neoplasm of tonsillar fossa: Secondary | ICD-10-CM | POA: Diagnosis not present

## 2019-06-19 NOTE — Telephone Encounter (Signed)
-----   Message from Rennis Harding, RN sent at 06/16/2019  4:41 PM EST ----- Regarding: Dr. Lorenso Courier 1st time Chemo follow up 1st time chemo follow up.

## 2019-06-19 NOTE — Telephone Encounter (Signed)
Called pt to see how he did with his treatment last week.  Left message to call back.

## 2019-06-20 ENCOUNTER — Other Ambulatory Visit: Payer: Self-pay

## 2019-06-20 ENCOUNTER — Ambulatory Visit
Admission: RE | Admit: 2019-06-20 | Discharge: 2019-06-20 | Disposition: A | Discharge status: Home / Self Care | Payer: BC Managed Care – PPO | Source: Ambulatory Visit | Attending: Radiation Oncology | Admitting: Radiation Oncology

## 2019-06-20 DIAGNOSIS — C090 Malignant neoplasm of tonsillar fossa: Secondary | ICD-10-CM | POA: Diagnosis not present

## 2019-06-20 DIAGNOSIS — Z510 Encounter for antineoplastic radiation therapy: Secondary | ICD-10-CM | POA: Diagnosis not present

## 2019-06-21 ENCOUNTER — Ambulatory Visit
Admission: RE | Admit: 2019-06-21 | Discharge: 2019-06-21 | Disposition: A | Discharge status: Home / Self Care | Payer: BC Managed Care – PPO | Source: Ambulatory Visit | Attending: Radiation Oncology | Admitting: Radiation Oncology

## 2019-06-21 ENCOUNTER — Other Ambulatory Visit: Payer: Self-pay

## 2019-06-21 ENCOUNTER — Inpatient Hospital Stay: Payer: BC Managed Care – PPO

## 2019-06-21 DIAGNOSIS — Z510 Encounter for antineoplastic radiation therapy: Secondary | ICD-10-CM | POA: Diagnosis not present

## 2019-06-21 DIAGNOSIS — C090 Malignant neoplasm of tonsillar fossa: Secondary | ICD-10-CM | POA: Diagnosis not present

## 2019-06-21 NOTE — Progress Notes (Signed)
Nutrition Follow-up:  Patient with carcinoma of the tonsillar fossa.  Patient has started chemotherapy and radiation therapy.  PEG placed on 06/06/19 (20 fr).    Met with patient following radiation.  Patient reports last night and today increased in sore throat.  Ibuprofen has been helping.  Reports that he is still able to eat all foods that he wants.  Yesterday able to eat breakfast bowl, then chicken sandwich with fries for lunch and burger for dinner.  Has tried the shakes all except the carnation breakfast essentials and likes them.  Has been trying to drink water and gatorade to stay hydrated.    Continues to flush tube daily and wife is changing dressing.     Medications: reviewed  Labs: reviewed  Anthropometrics:   Weight 289 lb 4 oz on 1/15 slight decrease from 291 lb 11.2 oz on 12/23Noted 3/3 2020 286 lb  04/23/19 275 lb  Estimated Energy Needs  Kcals: 2900-3300 Protein: 121-162 g Fluid: > 2.9 L  NUTRITION DIAGNOSIS: Predicted suboptimal energy intake continues   INTERVENTION:  Reviewed soft, moist foods to choose with sore throat.   Provided 1st complimentary case of ensure enlive.   Encouraged continued good hydration.  Continue flush of feeding tube daily.      MONITORING, EVALUATION, GOAL: Patient will consume adequate calories and protein to maintain muscle mass during treatment   NEXT VISIT: Wed, Jan 27 during infusion  Eric Weaver, [place], [place] Registered Dietitian [phone] (pager)

## 2019-06-22 ENCOUNTER — Ambulatory Visit
Admission: RE | Admit: 2019-06-22 | Discharge: 2019-06-22 | Disposition: A | Discharge status: Home / Self Care | Payer: BC Managed Care – PPO | Source: Ambulatory Visit | Attending: Radiation Oncology | Admitting: Radiation Oncology

## 2019-06-22 ENCOUNTER — Other Ambulatory Visit: Payer: Self-pay

## 2019-06-22 DIAGNOSIS — Z510 Encounter for antineoplastic radiation therapy: Secondary | ICD-10-CM | POA: Diagnosis not present

## 2019-06-22 DIAGNOSIS — C090 Malignant neoplasm of tonsillar fossa: Secondary | ICD-10-CM | POA: Diagnosis not present

## 2019-06-23 ENCOUNTER — Inpatient Hospital Stay: Payer: BC Managed Care – PPO

## 2019-06-23 ENCOUNTER — Ambulatory Visit
Admission: RE | Admit: 2019-06-23 | Discharge: 2019-06-23 | Disposition: A | Discharge status: Home / Self Care | Payer: BC Managed Care – PPO | Source: Ambulatory Visit | Attending: Radiation Oncology | Admitting: Radiation Oncology

## 2019-06-23 ENCOUNTER — Other Ambulatory Visit: Payer: Self-pay

## 2019-06-23 ENCOUNTER — Other Ambulatory Visit: Payer: Self-pay | Admitting: Hematology and Oncology

## 2019-06-23 VITALS — BP 126/86 | HR 83 | Temp 98.0°F | Resp 18 | Wt 295.8 lb

## 2019-06-23 DIAGNOSIS — R070 Pain in throat: Secondary | ICD-10-CM | POA: Diagnosis not present

## 2019-06-23 DIAGNOSIS — C090 Malignant neoplasm of tonsillar fossa: Secondary | ICD-10-CM | POA: Diagnosis not present

## 2019-06-23 DIAGNOSIS — F329 Major depressive disorder, single episode, unspecified: Secondary | ICD-10-CM | POA: Diagnosis not present

## 2019-06-23 DIAGNOSIS — Z5111 Encounter for antineoplastic chemotherapy: Secondary | ICD-10-CM | POA: Diagnosis not present

## 2019-06-23 DIAGNOSIS — Z931 Gastrostomy status: Secondary | ICD-10-CM | POA: Diagnosis not present

## 2019-06-23 DIAGNOSIS — Z510 Encounter for antineoplastic radiation therapy: Secondary | ICD-10-CM | POA: Diagnosis not present

## 2019-06-23 DIAGNOSIS — Z79899 Other long term (current) drug therapy: Secondary | ICD-10-CM | POA: Diagnosis not present

## 2019-06-23 DIAGNOSIS — Z923 Personal history of irradiation: Secondary | ICD-10-CM | POA: Diagnosis not present

## 2019-06-23 LAB — CMP (CANCER CENTER ONLY)
ALT: 35 U/L (ref 0–44)
AST: 22 U/L (ref 15–41)
Albumin: 3.9 g/dL (ref 3.5–5.0)
Alkaline Phosphatase: 60 U/L (ref 38–126)
Anion gap: 11 (ref 5–15)
BUN: 19 mg/dL (ref 6–20)
CO2: 26 mmol/L (ref 22–32)
Calcium: 9.2 mg/dL (ref 8.9–10.3)
Chloride: 103 mmol/L (ref 98–111)
Creatinine: 0.93 mg/dL (ref 0.61–1.24)
GFR, Est AFR Am: >60 mL/min (ref >60)
GFR, Estimated: >60 mL/min (ref >60)
Glucose, Bld: 108 mg/dL — ABNORMAL HIGH (ref 70–99)
Potassium: 4 mmol/L (ref 3.5–5.1)
Sodium: 140 mmol/L (ref 135–145)
Total Bilirubin: 0.3 mg/dL (ref 0.3–1.2)
Total Protein: 7.5 g/dL (ref 6.5–8.1)

## 2019-06-23 LAB — CBC WITH DIFFERENTIAL (CANCER CENTER ONLY)
Abs Immature Granulocytes: 0.03 10*3/uL (ref 0.00–0.07)
Basophils Absolute: 0 10*3/uL (ref 0.0–0.1)
Basophils Relative: 0 %
Eosinophils Absolute: 0.1 10*3/uL (ref 0.0–0.5)
Eosinophils Relative: 2 %
HCT: 39.6 % (ref 39.0–52.0)
Hemoglobin: 12.9 g/dL — ABNORMAL LOW (ref 13.0–17.0)
Immature Granulocytes: 0 %
Lymphocytes Relative: 15 %
Lymphs Abs: 1 10*3/uL (ref 0.7–4.0)
MCH: 27.2 pg (ref 26.0–34.0)
MCHC: 32.6 g/dL (ref 30.0–36.0)
MCV: 83.4 fL (ref 80.0–100.0)
Monocytes Absolute: 0.4 10*3/uL (ref 0.1–1.0)
Monocytes Relative: 6 %
Neutro Abs: 5.3 10*3/uL (ref 1.7–7.7)
Neutrophils Relative %: 77 %
Platelet Count: 314 10*3/uL (ref 150–400)
RBC: 4.75 MIL/uL (ref 4.22–5.81)
RDW: 13.9 % (ref 11.5–15.5)
WBC Count: 6.8 10*3/uL (ref 4.0–10.5)
nRBC: 0 % (ref 0.0–0.2)

## 2019-06-23 MED ORDER — SODIUM CHLORIDE 0.9% FLUSH
10.0000 mL | INTRAVENOUS | Status: DC | PRN
  Administered 2019-06-23: 10 mL
  Filled 2019-06-23: qty 10

## 2019-06-23 MED ORDER — POTASSIUM CHLORIDE 2 MEQ/ML IV SOLN
Freq: Once | INTRAVENOUS | Status: AC
  Filled 2019-06-23: qty 10

## 2019-06-23 MED ORDER — SODIUM CHLORIDE 0.9 % IV SOLN
150.0000 mg | Freq: Once | INTRAVENOUS | Status: AC
  Administered 2019-06-23: 150 mg via INTRAVENOUS
  Filled 2019-06-23: qty 150

## 2019-06-23 MED ORDER — SODIUM CHLORIDE 0.9 % IV SOLN
100.0000 mg | Freq: Once | INTRAVENOUS | Status: AC
  Administered 2019-06-23: 100 mg via INTRAVENOUS
  Filled 2019-06-23: qty 100

## 2019-06-23 MED ORDER — DEXAMETHASONE SODIUM PHOSPHATE 10 MG/ML IJ SOLN
INTRAMUSCULAR | Status: AC
  Filled 2019-06-23: qty 1

## 2019-06-23 MED ORDER — SODIUM CHLORIDE 0.9 % IV SOLN: 10.0000 mg | Freq: Once | INTRAVENOUS | Status: DC

## 2019-06-23 MED ORDER — SODIUM CHLORIDE 0.9 % IV SOLN
Freq: Once | INTRAVENOUS | Status: AC
  Filled 2019-06-23: qty 250

## 2019-06-23 MED ORDER — PALONOSETRON HCL INJECTION 0.25 MG/5ML
0.2500 mg | Freq: Once | INTRAVENOUS | Status: AC
  Administered 2019-06-23: 0.25 mg via INTRAVENOUS

## 2019-06-23 MED ORDER — PALONOSETRON HCL INJECTION 0.25 MG/5ML
INTRAVENOUS | Status: AC
  Filled 2019-06-23: qty 5

## 2019-06-23 MED ORDER — HEPARIN SOD (PORK) LOCK FLUSH 100 UNIT/ML IV SOLN
500.0000 [IU] | Freq: Once | INTRAVENOUS | Status: AC | PRN
  Administered 2019-06-23: 500 [IU]
  Filled 2019-06-23: qty 5

## 2019-06-23 MED ORDER — DEXAMETHASONE SODIUM PHOSPHATE 10 MG/ML IJ SOLN
10.0000 mg | Freq: Once | INTRAMUSCULAR | Status: AC
  Administered 2019-06-23: 10 mg via INTRAVENOUS

## 2019-06-23 NOTE — Patient Instructions (Signed)
[place] Discharge Instructions for Patients Receiving Chemotherapy  Today you received the following chemotherapy agents Cisplatin (PLATINOL).  To help prevent nausea and vomiting after your treatment, we encourage you to take your nausea medication as prescribed.   If you develop nausea and vomiting that is not controlled by your nausea medication, call the clinic.   BELOW ARE SYMPTOMS THAT SHOULD BE REPORTED IMMEDIATELY:  *FEVER GREATER THAN 100.5 F  *CHILLS WITH OR WITHOUT FEVER  NAUSEA AND VOMITING THAT IS NOT CONTROLLED WITH YOUR NAUSEA MEDICATION  *UNUSUAL SHORTNESS OF BREATH  *UNUSUAL BRUISING OR BLEEDING  TENDERNESS IN MOUTH AND THROAT WITH OR WITHOUT PRESENCE OF ULCERS  *URINARY PROBLEMS  *BOWEL PROBLEMS  UNUSUAL RASH Items with * indicate a potential emergency and should be followed up as soon as possible.  Feel free to call the clinic should you have any questions or concerns. The clinic phone number is [phone].  Please show the [place] at check-in to the Emergency Department and triage nurse.  Coronavirus (COVID-19) Are you at risk?  Are you at risk for the Coronavirus (COVID-19)?  To be considered HIGH RISK for Coronavirus (COVID-19), you have to meet the following criteria:  . Traveled to [place], [place], [place], [place] or [place]; or in the [place] to [place], [place], [place], or [place]; and have fever, cough, and shortness of breath within the last 2 weeks of travel OR . Been in close contact with a person diagnosed with COVID-19 within the last 2 weeks and have fever, cough, and shortness of breath . IF YOU DO NOT MEET THESE CRITERIA, YOU ARE CONSIDERED LOW RISK FOR COVID-19.  What to do if you are HIGH RISK for COVID-19?  Marland Kitchen If you are having a medical emergency, call 911. . Seek medical care right away. Before you go to a doctor's office, urgent care or emergency department, call ahead and tell them  about your recent travel, contact with someone diagnosed with COVID-19, and your symptoms. You should receive instructions from your physician's office regarding next steps of care.  . When you arrive at healthcare provider, tell the healthcare staff immediately you have returned from visiting [place], [place], [place], [place] or [place]; or traveled in the [place] to [place], [place], [employer], or [place]; in the last two weeks or you have been in close contact with a person diagnosed with COVID-19 in the last 2 weeks.   . Tell the health care staff about your symptoms: fever, cough and shortness of breath. . After you have been seen by a medical provider, you will be either: o Tested for (COVID-19) and discharged home on quarantine except to seek medical care if symptoms worsen, and asked to  - Stay home and avoid contact with others until you get your results (4-5 days)  - Avoid travel on public transportation if possible (such as bus, train, or airplane) or o Sent to the Emergency Department by EMS for evaluation, COVID-19 testing, and possible admission depending on your condition and test results.  What to do if you are LOW RISK for COVID-19?  Reduce your risk of any infection by using the same precautions used for avoiding the common cold or flu:  Marland Kitchen Wash your hands often with soap and warm water for at least 20 seconds.  If soap and water are not readily available, use an alcohol-based hand sanitizer with at least 60% alcohol.  . If coughing or  sneezing, cover your mouth and nose by coughing or sneezing into the elbow areas of your shirt or coat, into a tissue or into your sleeve (not your hands). . Avoid shaking hands with others and consider head nods or verbal greetings only. . Avoid touching your eyes, nose, or mouth with unwashed hands.  . Avoid close contact with people who are sick. . Avoid places or events with large numbers of people in one location, like concerts or  sporting events. . Carefully consider travel plans you have or are making. . If you are planning any travel outside or inside the [place], visit the CDC's Travelers' Health webpage for the latest health notices. . If you have some symptoms but not all symptoms, continue to monitor at home and seek medical attention if your symptoms worsen. . If you are having a medical emergency, call 911.   [place] / e-Visit: [email or web address]         [facility]: [facility]: [phone]                   [facility]: [phone]

## 2019-06-26 ENCOUNTER — Ambulatory Visit
Admission: RE | Admit: 2019-06-26 | Discharge: 2019-06-26 | Disposition: A | Discharge status: Home / Self Care | Payer: BC Managed Care – PPO | Source: Ambulatory Visit | Attending: Radiation Oncology | Admitting: Radiation Oncology

## 2019-06-26 ENCOUNTER — Other Ambulatory Visit: Payer: Self-pay | Admitting: Hematology and Oncology

## 2019-06-26 ENCOUNTER — Other Ambulatory Visit: Payer: Self-pay

## 2019-06-26 DIAGNOSIS — C090 Malignant neoplasm of tonsillar fossa: Secondary | ICD-10-CM | POA: Diagnosis not present

## 2019-06-26 DIAGNOSIS — Z510 Encounter for antineoplastic radiation therapy: Secondary | ICD-10-CM | POA: Diagnosis not present

## 2019-06-26 MED ORDER — OXYCODONE HCL 5 MG PO TABS: 5.0000 mg | ORAL_TABLET | Freq: Four times a day (QID) | ORAL | 0 refills | Status: DC | PRN

## 2019-06-26 NOTE — Progress Notes (Signed)
Informed by radiation oncology that Eric Weaver was having an increase in pain with swallowing. Called Eric Weaver to discuss his symptoms. It is becoming progressively more difficult and painful to swallow, patient hoping for better pain relief and medication to take prior to eating. I called in a prescription of oxycodone 5mg  q6H PRN to take 30-60 min prior to eating Eric Weaver voiced his understanding. We will continue to monitor.   Ledell Peoples, MD Department of Hematology/Oncology [place] at [facility] Phone: [phone] Pager: [phone] Email: [email or web address]

## 2019-06-27 ENCOUNTER — Ambulatory Visit
Admission: RE | Admit: 2019-06-27 | Discharge: 2019-06-27 | Disposition: A | Discharge status: Home / Self Care | Payer: BC Managed Care – PPO | Source: Ambulatory Visit | Attending: Radiation Oncology | Admitting: Radiation Oncology

## 2019-06-27 ENCOUNTER — Other Ambulatory Visit: Payer: Self-pay

## 2019-06-27 DIAGNOSIS — Z510 Encounter for antineoplastic radiation therapy: Secondary | ICD-10-CM | POA: Diagnosis not present

## 2019-06-27 DIAGNOSIS — C090 Malignant neoplasm of tonsillar fossa: Secondary | ICD-10-CM | POA: Diagnosis not present

## 2019-06-28 ENCOUNTER — Ambulatory Visit
Admission: RE | Admit: 2019-06-28 | Discharge: 2019-06-28 | Disposition: A | Discharge status: Home / Self Care | Payer: BC Managed Care – PPO | Source: Ambulatory Visit | Attending: Radiation Oncology | Admitting: Radiation Oncology

## 2019-06-28 ENCOUNTER — Other Ambulatory Visit: Payer: Self-pay

## 2019-06-28 ENCOUNTER — Inpatient Hospital Stay: Payer: BC Managed Care – PPO

## 2019-06-28 DIAGNOSIS — C090 Malignant neoplasm of tonsillar fossa: Secondary | ICD-10-CM | POA: Diagnosis not present

## 2019-06-28 DIAGNOSIS — Z510 Encounter for antineoplastic radiation therapy: Secondary | ICD-10-CM | POA: Diagnosis not present

## 2019-06-28 NOTE — Progress Notes (Signed)
[place] Telephone:([phone]   Fax:(336) [phone]  PROGRESS NOTE  Patient Care Team: [name], DO as PCP - General (Family Medicine) Izora Gala, MD as Consulting Physician (Otolaryngology) Eppie Gibson, MD as Attending Physician (Radiation Oncology) Leota Sauers, RN as Registered Nurse Orson Slick, MD as Consulting Physician (Hematology and Oncology) Sharen Counter, CCC-SLP as Speech Language Pathologist (Speech Pathology) Karie Mainland, RD as Dietitian (Nutrition) Wynelle Beckmann, Melodie Bouillon, PT as Physical Therapist (Physical Therapy) Kennith Center, LCSW as Social Worker  Hematological/Oncological History # 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 CT scan performed showing 4.1 x 4.0 x 5.3 cm mass centered at the right palatine tonsil, highly concerning for primary head and neck malignancy, likely squamous cell carcinoma, additionally there were partially necrotic right level II and III adenopathy. 2) 04/26/2019: referred to ENT, underwent right tonsillar biopsy confirming invasive squamous cell cancer 3) 05/17/2019: PET CT scan showed hypermetabolic mass at right palantine tonsil and hypermetabolic right level 2 and 3 lymph nodes. There was also mildly hypermetabolic lymph node in the left level 2 neck.  4) 05/24/2019: establish care with Dr. Lorenso Courier  5) 06/07/2019: started radiation therapy with Dr. Isidore Moos 6) 06/16/2019: Started Cisplatin 43m/m2 with concurrent radiation 7) 06/23/2019: Week 2 cisplatin 461mm2 9) 06/30/2019: Intended Week 3 cisplatin 4078m2  Interval History:  Eric Weaver 8o. male with medical history significant for carcinoma of the tonsillar fossa  presents for a follow up visit. The patient's last visit was on 05/24/2019 prior to the start of chemotherapy. In the interim since the last visit he has received 2 weekly doses of  cisplatin with a 3rd intended for tomorrow and a total of 5 planned.   On exam today Mr. Eric Weaver that he has been well with the exception of throat pain.  He notes that it has been difficult to eat, though he does report that yesterday he was able to eat a cheeseburger from [employer] reports that the 5 mg oxycodone is not taking the edge off his pain the way that he was hoping.  He notes that he has had some occasional dry mouth but "it is not that bad".  He notes that he began taking a p.o. iron pill with combined vitamin C [facility] that his blood counts have dropped slightly.  He notes that he has had some occasional nausea, but has not required the use of Compazine on a regular basis.  He notes that the nausea tends to be worse in the morning and exacerbated by drinking tea or water.  The only other symptom he has mentioned is occasional blood when blowing his nose, with some occasional light green mucus.  He denies having any fevers, chills, sweats, vomiting or diarrhea.  Full 10 point ROS is listed below.  MEDICAL HISTORY:  Past Medical History:  Diagnosis Date  . Depression   . Essential hypertension 08/02/2018  . GERD (gastroesophageal reflux disease)   . History of nephrolithiasis   . Vitamin D deficiency     SURGICAL HISTORY: Past Surgical History:  Procedure Laterality Date  . Biopsy of right tonsil Right 04/24/2019   Positive for squamous cell carcinoma  . IR GASTROSTOMY TUBE MOD SED  06/06/2019  . IR IMAGING GUIDED PORT INSERTION  06/06/2019    SOCIAL HISTORY: Social History   Socioeconomic  History  . Marital status: Married    Spouse name: Not on file  . Number of children: Not on file  . Years of education: Not on file  . Highest education level: Not on file  Occupational History  . Not on file  Tobacco Use  . Smoking status: Never Smoker  . Smokeless tobacco: Never Used  Substance and Sexual Activity  . Alcohol use: Yes     Comment: occasional  . Drug use: No  . Sexual activity: Not on file  Other Topics Concern  . Not on file  Social History Narrative  . Not on file   Social Determinants of Health   Financial Resource Strain:   . Difficulty of Paying Living Expenses: Not on file  Food Insecurity:   . Worried About Charity fundraiser in the Last Year: Not on file  . Ran Out of Food in the Last Year: Not on file  Transportation Needs:   . Lack of Transportation (Medical): Not on file  . Lack of Transportation (Non-Medical): Not on file  Physical Activity:   . Days of Exercise per Week: Not on file  . Minutes of Exercise per Session: Not on file  Stress:   . Feeling of Stress : Not on file  Social Connections:   . Frequency of Communication with Friends and Family: Not on file  . Frequency of Social Gatherings with Friends and Family: Not on file  . Attends Religious Services: Not on file  . Active Member of Clubs or Organizations: Not on file  . Attends Archivist Meetings: Not on file  . Marital Status: Not on file  Intimate Partner Violence:   . Fear of Current or Ex-Partner: Not on file  . Emotionally Abused: Not on file  . Physically Abused: Not on file  . Sexually Abused: Not on file    FAMILY HISTORY: Family History  Problem Relation Age of Onset  . Hypertension Mother   . Heart attack Father     ALLERGIES:  has No Known Allergies.  MEDICATIONS:  Current Outpatient Medications  Medication Sig Dispense Refill  . escitalopram (LEXAPRO) 20 MG tablet Take 1 tablet (20 mg total) by mouth daily. (Needs to be seen before next refill) 30 tablet 0  . esomeprazole (NEXIUM) 20 MG capsule Take 1 capsule (20 mg total) by mouth daily at 12 noon. 90 capsule 3  . ipratropium (ATROVENT) 0.06 % nasal spray Place 2 sprays into both nostrils 4 (four) times daily. (Patient not taking: Reported on 05/17/2019) 15 mL 0  . lidocaine (XYLOCAINE) 2 % solution Patient: Mix 1part 2% viscous  lidocaine, 1part H20. Swish & swallow 39m of diluted mixture, 317m before meals and at bedtime, up to QID 200 mL 4  . lidocaine-prilocaine (EMLA) cream Apply 1 application topically as needed. 30 g 2  . ondansetron (ZOFRAN) 8 MG tablet Take 1 tablet (8 mg total) by mouth every 8 (eight) hours as needed for nausea or vomiting. 30 tablet 1  . oxyCODONE (OXY IR/ROXICODONE) 5 MG immediate release tablet Take 1 tablet (5 mg total) by mouth every 6 (six) hours as needed for severe pain. 60 tablet 0  . prochlorperazine (COMPAZINE) 10 MG tablet Take 1 tablet (10 mg total) by mouth every 6 (six) hours as needed for nausea or vomiting. 30 tablet 1  . sodium fluoride (PREVIDENT 5000 PLUS) 1.1 % CREA dental cream Apply to tooth brush. Brush teeth for 2 minutes. Spit out excess. So NOT  swallow. Repeat nightly. 1 Tube prn  . tadalafil (CIALIS) 5 MG tablet Take 5 mg by mouth daily as needed.    . Vitamin D, Ergocalciferol, (DRISDOL) 1.25 MG (50000 UT) CAPS capsule TAKE 1 CAPSULE BY MOUTH WEEKLY 12 capsule 1   No current facility-administered medications for this visit.    REVIEW OF SYSTEMS:   Constitutional: ( - ) fevers, ( - )  chills , ( - ) night sweats Eyes: ( - ) blurriness of vision, ( - ) double vision, ( - ) watery eyes Ears, nose, mouth, throat, and face: ( - ) mucositis, ( - ) sore throat Respiratory: ( - ) cough, ( - ) dyspnea, ( - ) wheezes Cardiovascular: ( - ) palpitation, ( - ) chest discomfort, ( - ) lower extremity swelling Gastrointestinal:  ( - ) nausea, ( - ) heartburn, ( - ) change in bowel habits Skin: ( - ) abnormal skin rashes Lymphatics: ( - ) new lymphadenopathy, ( - ) easy bruising Neurological: ( - ) numbness, ( - ) tingling, ( - ) new weaknesses Behavioral/Psych: ( - ) mood change, ( - ) new changes  All other systems were reviewed with the patient and are negative.  PHYSICAL EXAMINATION: ECOG PERFORMANCE STATUS: 1 - Symptomatic but completely ambulatory  Vitals:    06/29/19 1114  BP: (!) 141/94  Pulse: 84  Resp: 18  Temp: 98 F (36.7 C)  SpO2: 100%   Filed Weights   06/29/19 1114  Weight: 290 lb 12.8 oz (131.9 kg)    GENERAL: well appearing obese Caucasian male in NAD  SKIN: skin color, texture, turgor are normal, no rashes or significant lesions EYES: conjunctiva are pink and non-injected, sclera clear LUNGS: clear to auscultation and percussion with normal breathing effort HEART: regular rate & rhythm and no murmurs and no lower extremity edema ABDOMEN: soft, non-tender, non-distended, normal bowel sounds Musculoskeletal: no cyanosis of digits and no clubbing  PSYCH: alert & oriented x 3, fluent speech NEURO: no focal motor/sensory deficits  LABORATORY DATA:  I have reviewed the data as listed CBC Latest Ref Rng & Units 06/29/2019 06/23/2019 06/15/2019  WBC 4.0 - 10.5 K/uL 7.0 6.8 7.6  Hemoglobin 13.0 - 17.0 g/dL 13.0 12.9(L) 13.6  Hematocrit 39.0 - 52.0 % 39.8 39.6 42.9  Platelets 150 - 400 K/uL 267 314 324    CMP Latest Ref Rng & Units 06/29/2019 06/23/2019 06/15/2019  Glucose 70 - 99 mg/dL 73 108(H) 95  BUN 6 - 20 mg/dL '17 19 19  ' Creatinine 0.61 - 1.24 mg/dL 0.92 0.93 1.08  Sodium 135 - 145 mmol/L 140 140 143  Potassium 3.5 - 5.1 mmol/L 4.3 4.0 4.1  Chloride 98 - 111 mmol/L 104 103 106  CO2 22 - 32 mmol/L '27 26 27  ' Calcium 8.9 - 10.3 mg/dL 9.1 9.2 9.0  Total Protein 6.5 - 8.1 g/dL 7.8 7.5 8.3(H)  Total Bilirubin 0.3 - 1.2 mg/dL 0.3 0.3 0.3  Alkaline Phos 38 - 126 U/L 64 60 65  AST 15 - 41 U/L 32 22 32  ALT 0 - 44 U/L 48(H) 35 51(H)     RADIOGRAPHIC STUDIES:  IR Gastrostomy Tube  Result Date: 06/06/2019 CLINICAL DATA:  Tonsillar squamous carcinoma and need for gastrostomy tube for supplemental nutrition during treatment. EXAM: PERCUTANEOUS GASTROSTOMY TUBE PLACEMENT ANESTHESIA/SEDATION: 3.0 mg IV Versed; 50 mcg IV Fentanyl. Total Moderate Sedation Time 19 minutes. The patient's level of consciousness and physiologic status were  continuously monitored during the  procedure by Radiology nursing. CONTRAST:  53m OMNIPAQUE IOHEXOL 300 MG/ML  SOLN MEDICATIONS: 2 g IV Ancef. IV antibiotic was administered in an appropriate time interval prior to needle puncture of the skin. FLUOROSCOPY TIME:  1 minutes and 30 seconds.  92.9 mGy. PROCEDURE: The procedure, risks, benefits, and alternatives were explained to the patient. Questions regarding the procedure were encouraged and answered. The patient understands and consents to the procedure. A time-out was performed prior to initiating the procedure. A 5-French catheter was then advanced through the patient's mouth under fluoroscopy into the esophagus and to the level of the stomach. This catheter was used to insufflate the stomach with air under fluoroscopy. The abdominal wall was prepped with chlorhexidine in a sterile fashion, and a sterile drape was applied covering the operative field. A sterile gown and sterile gloves were used for the procedure. Local anesthesia was provided with 1% Lidocaine. A skin incision was made in the upper abdominal wall. Under fluoroscopy, an 18 gauge trocar needle was advanced into the stomach. Contrast injection was performed to confirm intraluminal position of the needle tip. A single T tack was then deployed in the lumen of the stomach. This was brought up to tension at the skin surface. Over a guidewire, a 9-French sheath was advanced into the lumen of the stomach. The wire was left in place as a safety wire. A loop snare device from a percutaneous gastrostomy kit was then advanced into the stomach. A floppy guide wire was advanced through the orogastric catheter under fluoroscopy in the stomach. The loop snare advanced through the percutaneous gastric access was used to snare the guide wire. This allowed withdrawal of the loop snare out of the patient's mouth by retraction of the orogastric catheter and wire. A 20-French bumper retention gastrostomy tube was looped  around the snare device. It was then pulled back through the patient's mouth. The retention bumper was brought up to the anterior gastric wall. The T tack suture was cut at the skin. The exiting gastrostomy tube was cut to appropriate length and a feeding adapter applied. The catheter was injected with contrast material to confirm position and a fluoroscopic spot image saved. The tube was then flushed with saline. A dressing was applied over the gastrostomy exit site. COMPLICATIONS: None. FINDINGS: The stomach distended well with air allowing safe placement of the gastrostomy tube. After placement, the tip of the gastrostomy tube lies in the body of the stomach. IMPRESSION: Percutaneous gastrostomy with placement of a 20-French bumper retention tube in the body of the stomach. This tube can be used for percutaneous feeds beginning in 24 hours after placement. Electronically Signed   By: GAletta EdouardM.D.   On: 06/06/2019 12:47   IR IMAGING GUIDED PORT INSERTION  Result Date: 06/06/2019 CLINICAL DATA:  Tonsillar squamous carcinoma and need for porta cath for chemotherapy. EXAM: IMPLANTED PORT A CATH PLACEMENT WITH ULTRASOUND AND FLUOROSCOPIC GUIDANCE ANESTHESIA/SEDATION: 5.0 mg IV Versed; 150 mcg IV Fentanyl Total Moderate Sedation Time:  37 minutes The patient's level of consciousness and physiologic status were continuously monitored during the procedure by Radiology nursing. Additional Medications: 2 g IV Ancef. FLUOROSCOPY TIME:  30 seconds.  15.8 mGy. PROCEDURE: The procedure, risks, benefits, and alternatives were explained to the patient. Questions regarding the procedure were encouraged and answered. The patient understands and consents to the procedure. A time-out was performed prior to initiating the procedure. Ultrasound was utilized to confirm patency of the right internal jugular vein. The right neck  and chest were prepped with chlorhexidine in a sterile fashion, and a sterile drape was applied  covering the operative field. Maximum barrier sterile technique with sterile gowns and gloves were used for the procedure. Local anesthesia was provided with 1% lidocaine. After creating a small venotomy incision, a 21 gauge needle was advanced into the right internal jugular vein under direct, real-time ultrasound guidance. Ultrasound image documentation was performed. After securing guidewire access, an 8 Fr dilator was placed. A J-wire was kinked to measure appropriate catheter length. A subcutaneous port pocket was then created along the upper chest wall utilizing sharp and blunt dissection. Portable cautery was utilized. The pocket was irrigated with sterile saline. A single lumen power injectable port was chosen for placement. The 8 Fr catheter was tunneled from the port pocket site to the venotomy incision. The port was placed in the pocket. External catheter was trimmed to appropriate length based on guidewire measurement. At the venotomy, an 8 Fr peel-away sheath was placed over a guidewire. The catheter was then placed through the sheath and the sheath removed. Final catheter positioning was confirmed and documented with a fluoroscopic spot image. The port was accessed with a needle and aspirated and flushed with heparinized saline. The access needle was removed. The venotomy and port pocket incisions were closed with subcutaneous 3-0 Monocryl and subcuticular 4-0 Vicryl. Dermabond was applied to both incisions. COMPLICATIONS: COMPLICATIONS None FINDINGS: After catheter placement, the tip lies at the cavo-atrial junction. The catheter aspirates normally and is ready for immediate use. IMPRESSION: Placement of single lumen port a cath via right internal jugular vein. The catheter tip lies at the cavo-atrial junction. A power injectable port a cath was placed and is ready for immediate use. Electronically Signed   By: Aletta Edouard M.D.   On: 06/06/2019 12:45    ASSESSMENT & PLAN Leopold Smyers 49 y.o.  male with medical history significant for carcinoma of the tonsillar fossa  presents for a follow up visit.  After review the labs and discussion with the patient we have planned to continue proceeding forward with cisplatin 40 mg/m for an additional 3 doses.  His next dose is scheduled for tomorrow with 2 additional doses after that time.  The patient is currently tolerating therapy well with minimal changes to his labs and the only adverse effect being painful swallowing which is to be expected.  Fortunately at this time the patient is still requiring all of his nutrition p.o. we will continue to follow him closely and plan to see him shortly before completion of chemoradiation.  # Carcinoma of the Tonsillar Fossa ( Stage II, T3N2M0 p16+) --continue cisplatin 36m/m2 as planned. Tomorrow will be Week 3 of treatment. He has a total of 5 chemotherapy treatments planned with concurrent radiation.  --Hgb and Cr are stable on therapy. Minimal nausea. Continue at current dosages.  --after completion of chemoradiation we will reassess in 12-16 weeks to assure response to therapy. PET CT preferred.  --Plan for RTC on 07/13/2019 with labs   #Throat Pain, stable --continue oxycodone 5-131mq6 H PRN --encourage continued PO intake  --G-tube in place for feeding if required  #Nutritional Status, stable --continues to intake PO only at this time --continues to flush PEG tube as recommended.  --has currently lost 1 lb since 05/24/2019 prior to treatment --minimal nausea reported, compazine PRN  No orders of the defined types were placed in this encounter.  All questions were answered. The patient knows to call the clinic with  any problems, questions or concerns.  A total of more than 30 minutes were spent on this encounter and over half of that time was spent on counseling and coordination of care as outlined above.   Ledell Peoples, MD Department of Hematology/Oncology [place] at  [facility] [place] Phone: [phone] Pager: [phone] Email: [email or web address]  06/30/2019 8:34 AM

## 2019-06-28 NOTE — Progress Notes (Signed)
Nutrition Follow-up:  Patient with carcinoma of tonsillar fossa.  Patient receiving chemotherapy and radiation therapy.  PEG placed on 06/06/19 (20 fr).   Met with patient following radiation.  Patient reports throat, tongue and roof of mouth is sore/raw but pushing through it.  Reports that he did not eat breakfast yesterday. Ate spaghetti for lunch and banana pudding later in the afternoon.  Supper was barbecue with chicken and cheese dip with chips.  Ate oatmeal this am.  Has been drinking ensure shakes at times.  Has protein powder that he mixes with milk (1 scoop 30 g protein).  Reports taking nausea medications 1-2 times.  Had one episode after drinking banana smoothie which felt nauseated or may throw up and took medications.  Continues to flush feeding tube daily.    Medications: reviewed  Labs: reviewed  Anthropometrics:   Weight 295 lb 12 oz on 1/22.   Noted 291 lb 8 oz on 1/25 via [facility].    289 lb 4 oz on 1/15 291 lb 11.2 oz on 12/23   Estimated Energy Needs  Kcals: 5102-5852 Protein: 121-162 g Fluid: > 2.9 L  NUTRITION DIAGNOSIS: Predicted suboptimal energy intake continues   INTERVENTION:  Patient will continue to focus on good sources of protein and soft, moist foods. Encouraged patient to continue to drink oral nutrition supplements as needed. Discussed over next week that if soreness in mouth and throat increases can begin using feeding tube 1-2 times per day.  Reviewed processes for giving feeding.  Recommend 60 ml of water flush before (1 syringe), 1 full carton tube feeding and 60 ml water flush after.   Patient has contact information    MONITORING, EVALUATION, GOAL: Patient will consume adequate calories and protein to maintain muscle mass during treatment   NEXT VISIT: Wed, Feb 3rd after radiation  Conswella Bruney B. Zenia Resides, [place], [place] Registered Dietitian [phone] (pager)

## 2019-06-29 ENCOUNTER — Other Ambulatory Visit: Payer: Self-pay

## 2019-06-29 ENCOUNTER — Inpatient Hospital Stay (HOSPITAL_BASED_OUTPATIENT_CLINIC_OR_DEPARTMENT_OTHER): Payer: BC Managed Care – PPO | Admitting: Hematology and Oncology

## 2019-06-29 ENCOUNTER — Ambulatory Visit
Admission: RE | Admit: 2019-06-29 | Discharge: 2019-06-29 | Disposition: A | Discharge status: Home / Self Care | Payer: BC Managed Care – PPO | Source: Ambulatory Visit | Attending: Radiation Oncology | Admitting: Radiation Oncology

## 2019-06-29 ENCOUNTER — Inpatient Hospital Stay: Payer: BC Managed Care – PPO

## 2019-06-29 VITALS — BP 141/94 | HR 84 | Temp 98.0°F | Resp 18 | Ht 72.0 in | Wt 290.8 lb

## 2019-06-29 DIAGNOSIS — C090 Malignant neoplasm of tonsillar fossa: Secondary | ICD-10-CM

## 2019-06-29 DIAGNOSIS — Z931 Gastrostomy status: Secondary | ICD-10-CM | POA: Diagnosis not present

## 2019-06-29 DIAGNOSIS — Z510 Encounter for antineoplastic radiation therapy: Secondary | ICD-10-CM | POA: Diagnosis not present

## 2019-06-29 DIAGNOSIS — Z5111 Encounter for antineoplastic chemotherapy: Secondary | ICD-10-CM | POA: Diagnosis not present

## 2019-06-29 DIAGNOSIS — R070 Pain in throat: Secondary | ICD-10-CM | POA: Diagnosis not present

## 2019-06-29 DIAGNOSIS — Z79899 Other long term (current) drug therapy: Secondary | ICD-10-CM | POA: Diagnosis not present

## 2019-06-29 DIAGNOSIS — Z923 Personal history of irradiation: Secondary | ICD-10-CM | POA: Diagnosis not present

## 2019-06-29 DIAGNOSIS — F329 Major depressive disorder, single episode, unspecified: Secondary | ICD-10-CM | POA: Diagnosis not present

## 2019-06-29 LAB — CBC WITH DIFFERENTIAL (CANCER CENTER ONLY)
Abs Immature Granulocytes: 0.03 10*3/uL (ref 0.00–0.07)
Basophils Absolute: 0 10*3/uL (ref 0.0–0.1)
Basophils Relative: 0 %
Eosinophils Absolute: 0.1 10*3/uL (ref 0.0–0.5)
Eosinophils Relative: 1 %
HCT: 39.8 % (ref 39.0–52.0)
Hemoglobin: 13 g/dL (ref 13.0–17.0)
Immature Granulocytes: 0 %
Lymphocytes Relative: 10 %
Lymphs Abs: 0.7 10*3/uL (ref 0.7–4.0)
MCH: 27.3 pg (ref 26.0–34.0)
MCHC: 32.7 g/dL (ref 30.0–36.0)
MCV: 83.6 fL (ref 80.0–100.0)
Monocytes Absolute: 0.5 10*3/uL (ref 0.1–1.0)
Monocytes Relative: 8 %
Neutro Abs: 5.6 10*3/uL (ref 1.7–7.7)
Neutrophils Relative %: 81 %
Platelet Count: 267 10*3/uL (ref 150–400)
RBC: 4.76 MIL/uL (ref 4.22–5.81)
RDW: 14.3 % (ref 11.5–15.5)
WBC Count: 7 10*3/uL (ref 4.0–10.5)
nRBC: 0 % (ref 0.0–0.2)

## 2019-06-29 LAB — CMP (CANCER CENTER ONLY)
ALT: 48 U/L — ABNORMAL HIGH (ref 0–44)
AST: 32 U/L (ref 15–41)
Albumin: 4 g/dL (ref 3.5–5.0)
Alkaline Phosphatase: 64 U/L (ref 38–126)
Anion gap: 9 (ref 5–15)
BUN: 17 mg/dL (ref 6–20)
CO2: 27 mmol/L (ref 22–32)
Calcium: 9.1 mg/dL (ref 8.9–10.3)
Chloride: 104 mmol/L (ref 98–111)
Creatinine: 0.92 mg/dL (ref 0.61–1.24)
GFR, Est AFR Am: >60 mL/min (ref >60)
GFR, Estimated: >60 mL/min (ref >60)
Glucose, Bld: 73 mg/dL (ref 70–99)
Potassium: 4.3 mmol/L (ref 3.5–5.1)
Sodium: 140 mmol/L (ref 135–145)
Total Bilirubin: 0.3 mg/dL (ref 0.3–1.2)
Total Protein: 7.8 g/dL (ref 6.5–8.1)

## 2019-06-30 ENCOUNTER — Encounter: Payer: Self-pay | Admitting: Hematology and Oncology

## 2019-06-30 ENCOUNTER — Other Ambulatory Visit: Payer: BC Managed Care – PPO

## 2019-06-30 ENCOUNTER — Other Ambulatory Visit: Payer: Self-pay | Admitting: Hematology and Oncology

## 2019-06-30 ENCOUNTER — Encounter: Payer: Self-pay | Admitting: *Deleted

## 2019-06-30 ENCOUNTER — Ambulatory Visit
Admission: RE | Admit: 2019-06-30 | Discharge: 2019-06-30 | Disposition: A | Discharge status: Home / Self Care | Payer: BC Managed Care – PPO | Source: Ambulatory Visit | Attending: Radiation Oncology | Admitting: Radiation Oncology

## 2019-06-30 ENCOUNTER — Other Ambulatory Visit: Payer: Self-pay

## 2019-06-30 ENCOUNTER — Inpatient Hospital Stay: Payer: BC Managed Care – PPO

## 2019-06-30 VITALS — BP 117/83 | HR 78 | Temp 97.8°F | Resp 18

## 2019-06-30 DIAGNOSIS — R070 Pain in throat: Secondary | ICD-10-CM | POA: Diagnosis not present

## 2019-06-30 DIAGNOSIS — Z5111 Encounter for antineoplastic chemotherapy: Secondary | ICD-10-CM | POA: Diagnosis not present

## 2019-06-30 DIAGNOSIS — F329 Major depressive disorder, single episode, unspecified: Secondary | ICD-10-CM | POA: Diagnosis not present

## 2019-06-30 DIAGNOSIS — Z923 Personal history of irradiation: Secondary | ICD-10-CM | POA: Diagnosis not present

## 2019-06-30 DIAGNOSIS — Z510 Encounter for antineoplastic radiation therapy: Secondary | ICD-10-CM | POA: Diagnosis not present

## 2019-06-30 DIAGNOSIS — Z931 Gastrostomy status: Secondary | ICD-10-CM | POA: Diagnosis not present

## 2019-06-30 DIAGNOSIS — C090 Malignant neoplasm of tonsillar fossa: Secondary | ICD-10-CM | POA: Diagnosis not present

## 2019-06-30 DIAGNOSIS — Z79899 Other long term (current) drug therapy: Secondary | ICD-10-CM | POA: Diagnosis not present

## 2019-06-30 MED ORDER — HEPARIN SOD (PORK) LOCK FLUSH 100 UNIT/ML IV SOLN
500.0000 [IU] | Freq: Once | INTRAVENOUS | Status: AC | PRN
  Administered 2019-06-30: 500 [IU]
  Filled 2019-06-30: qty 5

## 2019-06-30 MED ORDER — SODIUM CHLORIDE 0.9 % IV SOLN
150.0000 mg | Freq: Once | INTRAVENOUS | Status: AC
  Administered 2019-06-30: 150 mg via INTRAVENOUS
  Filled 2019-06-30: qty 150

## 2019-06-30 MED ORDER — DEXAMETHASONE SODIUM PHOSPHATE 10 MG/ML IJ SOLN
10.0000 mg | Freq: Once | INTRAMUSCULAR | Status: AC
  Administered 2019-06-30: 10 mg via INTRAVENOUS

## 2019-06-30 MED ORDER — PALONOSETRON HCL INJECTION 0.25 MG/5ML
0.2500 mg | Freq: Once | INTRAVENOUS | Status: AC
  Administered 2019-06-30: 0.25 mg via INTRAVENOUS

## 2019-06-30 MED ORDER — SODIUM CHLORIDE 0.9% FLUSH
10.0000 mL | INTRAVENOUS | Status: DC | PRN
  Administered 2019-06-30: 10 mL
  Filled 2019-06-30: qty 10

## 2019-06-30 MED ORDER — PALONOSETRON HCL INJECTION 0.25 MG/5ML
INTRAVENOUS | Status: AC
  Filled 2019-06-30: qty 5

## 2019-06-30 MED ORDER — DEXAMETHASONE SODIUM PHOSPHATE 10 MG/ML IJ SOLN
INTRAMUSCULAR | Status: AC
  Filled 2019-06-30: qty 1

## 2019-06-30 MED ORDER — SODIUM CHLORIDE 0.9 % IV SOLN
Freq: Once | INTRAVENOUS | Status: AC
  Filled 2019-06-30: qty 250

## 2019-06-30 MED ORDER — SODIUM CHLORIDE 0.9 % IV SOLN
38.5000 mg/m2 | Freq: Once | INTRAVENOUS | Status: AC
  Administered 2019-06-30: 100 mg via INTRAVENOUS
  Filled 2019-06-30: qty 100

## 2019-06-30 MED ORDER — POTASSIUM CHLORIDE 2 MEQ/ML IV SOLN
Freq: Once | INTRAVENOUS | Status: AC
  Filled 2019-06-30: qty 10

## 2019-06-30 NOTE — Patient Instructions (Signed)
[facility] Discharge Instructions for Patients Receiving Chemotherapy  Today you received the following chemotherapy agents Cisplatin (PLATINOL).  To help prevent nausea and vomiting after your treatment, we encourage you to take your nausea medication as prescribed.   If you develop nausea and vomiting that is not controlled by your nausea medication, call the clinic.   BELOW ARE SYMPTOMS THAT SHOULD BE REPORTED IMMEDIATELY:  *FEVER GREATER THAN 100.5 F  *CHILLS WITH OR WITHOUT FEVER  NAUSEA AND VOMITING THAT IS NOT CONTROLLED WITH YOUR NAUSEA MEDICATION  *UNUSUAL SHORTNESS OF BREATH  *UNUSUAL BRUISING OR BLEEDING  TENDERNESS IN MOUTH AND THROAT WITH OR WITHOUT PRESENCE OF ULCERS  *URINARY PROBLEMS  *BOWEL PROBLEMS  UNUSUAL RASH Items with * indicate a potential emergency and should be followed up as soon as possible.  Feel free to call the clinic should you have any questions or concerns. The clinic phone number is [phone].  Please show the [facility] at check-in to the Emergency Department and triage nurse.  Coronavirus (COVID-19) Are you at risk?  Are you at risk for the Coronavirus (COVID-19)?  To be considered HIGH RISK for Coronavirus (COVID-19), you have to meet the following criteria:  . Traveled to [place], [place], [place], [place] or [place]; or in the [place] to [place], [place], [place], or [place]; and have fever, cough, and shortness of breath within the last 2 weeks of travel OR . Been in close contact with a person diagnosed with COVID-19 within the last 2 weeks and have fever, cough, and shortness of breath . IF YOU DO NOT MEET THESE CRITERIA, YOU ARE CONSIDERED LOW RISK FOR COVID-19.  What to do if you are HIGH RISK for COVID-19?  Marland Kitchen If you are having a medical emergency, call 911. . Seek medical care right away. Before you go to a doctor's office, urgent care or emergency department, call ahead and tell them  about your recent travel, contact with someone diagnosed with COVID-19, and your symptoms. You should receive instructions from your physician's office regarding next steps of care.  . When you arrive at healthcare provider, tell the healthcare staff immediately you have returned from visiting [place], [place], [place], [place] or [place]; or traveled in the [place] to [place], [place], [place], or [place]; in the last two weeks or you have been in close contact with a person diagnosed with COVID-19 in the last 2 weeks.   . Tell the health care staff about your symptoms: fever, cough and shortness of breath. . After you have been seen by a medical provider, you will be either: o Tested for (COVID-19) and discharged home on quarantine except to seek medical care if symptoms worsen, and asked to  - Stay home and avoid contact with others until you get your results (4-5 days)  - Avoid travel on public transportation if possible (such as bus, train, or airplane) or o Sent to the Emergency Department by EMS for evaluation, COVID-19 testing, and possible admission depending on your condition and test results.  What to do if you are LOW RISK for COVID-19?  Reduce your risk of any infection by using the same precautions used for avoiding the common cold or flu:  Marland Kitchen Wash your hands often with soap and warm water for at least 20 seconds.  If soap and water are not readily available, use an alcohol-based hand sanitizer with at least 60% alcohol.  . If coughing or  sneezing, cover your mouth and nose by coughing or sneezing into the elbow areas of your shirt or coat, into a tissue or into your sleeve (not your hands). . Avoid shaking hands with others and consider head nods or verbal greetings only. . Avoid touching your eyes, nose, or mouth with unwashed hands.  . Avoid close contact with people who are sick. . Avoid places or events with large numbers of people in one location, like concerts or  sporting events. . Carefully consider travel plans you have or are making. . If you are planning any travel outside or inside the [place], visit the CDC's Travelers' Health webpage for the latest health notices. . If you have some symptoms but not all symptoms, continue to monitor at home and seek medical attention if your symptoms worsen. . If you are having a medical emergency, call 911.   [place] / e-Visit: [email or web address]         [facility]: [facility]: [phone]                   [facility]: [phone]

## 2019-06-30 NOTE — Progress Notes (Signed)
Oncology Nurse Navigator Documentation  Met with Mr. Eric Weaver in Infusion to check on his well-being.  He was receiving this week's cisplatin.  He acknowledged he has reached the half-way mark for RT, reported increasing skin irritation in areas of tmt, increasing throat soreness, mouth sores.  He is managing SEs.  I provided him patient and caregiver notes of support/encouragement written by a previous patient/caregiver who attended H&N [facility].  I encouraged him to call me as needed moving forward.  He agreed to do so.  Gayleen Orem, RN, BSN Head & Neck Oncology Nurse [facility]as at [place] [phone]

## 2019-07-03 ENCOUNTER — Other Ambulatory Visit: Payer: Self-pay

## 2019-07-03 ENCOUNTER — Telehealth: Payer: Self-pay | Admitting: Hematology and Oncology

## 2019-07-03 ENCOUNTER — Ambulatory Visit
Admission: RE | Admit: 2019-07-03 | Discharge: 2019-07-03 | Disposition: A | Discharge status: Home / Self Care | Payer: BC Managed Care – PPO | Source: Ambulatory Visit | Attending: Radiation Oncology | Admitting: Radiation Oncology

## 2019-07-03 DIAGNOSIS — Z510 Encounter for antineoplastic radiation therapy: Secondary | ICD-10-CM | POA: Diagnosis not present

## 2019-07-03 DIAGNOSIS — C090 Malignant neoplasm of tonsillar fossa: Secondary | ICD-10-CM | POA: Insufficient documentation

## 2019-07-03 NOTE — Telephone Encounter (Signed)
Scheduled per los. Called and spoke with patient. Confirmed appts  

## 2019-07-04 ENCOUNTER — Other Ambulatory Visit: Payer: Self-pay

## 2019-07-04 ENCOUNTER — Ambulatory Visit
Admission: RE | Admit: 2019-07-04 | Discharge: 2019-07-04 | Disposition: A | Discharge status: Home / Self Care | Payer: BC Managed Care – PPO | Source: Ambulatory Visit | Attending: Radiation Oncology | Admitting: Radiation Oncology

## 2019-07-04 DIAGNOSIS — Z510 Encounter for antineoplastic radiation therapy: Secondary | ICD-10-CM | POA: Diagnosis not present

## 2019-07-04 DIAGNOSIS — C090 Malignant neoplasm of tonsillar fossa: Secondary | ICD-10-CM | POA: Diagnosis not present

## 2019-07-05 ENCOUNTER — Other Ambulatory Visit: Payer: Self-pay

## 2019-07-05 ENCOUNTER — Inpatient Hospital Stay: Payer: BC Managed Care – PPO | Attending: Hematology and Oncology

## 2019-07-05 ENCOUNTER — Ambulatory Visit
Admission: RE | Admit: 2019-07-05 | Discharge: 2019-07-05 | Disposition: A | Discharge status: Home / Self Care | Payer: BC Managed Care – PPO | Source: Ambulatory Visit | Attending: Radiation Oncology | Admitting: Radiation Oncology

## 2019-07-05 DIAGNOSIS — I10 Essential (primary) hypertension: Secondary | ICD-10-CM | POA: Insufficient documentation

## 2019-07-05 DIAGNOSIS — C090 Malignant neoplasm of tonsillar fossa: Secondary | ICD-10-CM | POA: Diagnosis not present

## 2019-07-05 DIAGNOSIS — R110 Nausea: Secondary | ICD-10-CM | POA: Insufficient documentation

## 2019-07-05 DIAGNOSIS — Z931 Gastrostomy status: Secondary | ICD-10-CM | POA: Insufficient documentation

## 2019-07-05 DIAGNOSIS — Z923 Personal history of irradiation: Secondary | ICD-10-CM | POA: Insufficient documentation

## 2019-07-05 DIAGNOSIS — E559 Vitamin D deficiency, unspecified: Secondary | ICD-10-CM | POA: Insufficient documentation

## 2019-07-05 DIAGNOSIS — Z87442 Personal history of urinary calculi: Secondary | ICD-10-CM | POA: Insufficient documentation

## 2019-07-05 DIAGNOSIS — Z79899 Other long term (current) drug therapy: Secondary | ICD-10-CM | POA: Insufficient documentation

## 2019-07-05 DIAGNOSIS — Z510 Encounter for antineoplastic radiation therapy: Secondary | ICD-10-CM | POA: Diagnosis not present

## 2019-07-05 DIAGNOSIS — F329 Major depressive disorder, single episode, unspecified: Secondary | ICD-10-CM | POA: Insufficient documentation

## 2019-07-05 DIAGNOSIS — K219 Gastro-esophageal reflux disease without esophagitis: Secondary | ICD-10-CM | POA: Insufficient documentation

## 2019-07-05 DIAGNOSIS — Z5111 Encounter for antineoplastic chemotherapy: Secondary | ICD-10-CM | POA: Insufficient documentation

## 2019-07-05 NOTE — Progress Notes (Signed)
Nutrition Follow-up:  Patient with carcinoma of tonsillar fossa.  Patient receiving chemotherapy and radiation therapy.  PEG placed on 06/06/19 (20 fr).  Met with patient following radiation.  Patient reports that he continues to be able to eat most foods that he wants.  Has eaten double whopper from [employer] recently.  Yesterday ate oatmeal for breakfast, ravioli for lunch and baked spaghetti with salad for dinner.  Drinks milk with protein powder every other day.  Drinking water, tea and diet sun drop.  Reports taste alterations, pain in mouth and on swallowing but managing.  Reports some fatigue.  Bowels are moving normally per patient.   Flushing tube daily.      Medications: reviewed   Labs: reviewed  Anthropometrics:   Weight noted on 07/03/19 via [facility] 291 lb 4 oz 06/29/19 weight of 290 lb 12.8 oz  1/25 via [facility] 291 lb 11.2 oz 06/23/19 weight 295 lb (??) 06/16/19 weight 289 lb  4oz 05/23/20 weight 291 lb 11.2 oz  Overall, stable weight.   Estimated Energy Needs  Kcals: 6269-4854 Protein: 121-162 g Fluid: > 2.9 L  NUTRITION DIAGNOSIS: Predicted suboptimal energy intake continues   INTERVENTION:  Patient will continue to focus on good sources of protein and soft, moist foods Patient will drink milk with protein powder daily for additional protein and calories.  Patient will keep track of intake and reach protein goals daily Patient knows that he can begin using feeding tube 1-2 times per day if needed in the next week if intake decreases.  Feedings should be 3-4 hours apart.   Continue to flush tube with 17m of water daily when not in use.  Patient has contact information    MONITORING, EVALUATION, GOAL: Patient will consume adequate calories and protein to maintain muscle mass during treatment.    NEXT VISIT: Wed, Feb 10th after radiation  Laraina Sulton B. AZenia Resides [place]edar [place] LGrosse Pointe WoodsRegistered Dietitian [phone](pager)

## 2019-07-06 ENCOUNTER — Ambulatory Visit
Admission: RE | Admit: 2019-07-06 | Discharge: 2019-07-06 | Disposition: A | Discharge status: Home / Self Care | Payer: BC Managed Care – PPO | Source: Ambulatory Visit | Attending: Radiation Oncology | Admitting: Radiation Oncology

## 2019-07-06 ENCOUNTER — Inpatient Hospital Stay: Payer: BC Managed Care – PPO

## 2019-07-06 ENCOUNTER — Other Ambulatory Visit: Payer: Self-pay

## 2019-07-06 ENCOUNTER — Other Ambulatory Visit: Payer: Self-pay | Admitting: Hematology and Oncology

## 2019-07-06 DIAGNOSIS — I10 Essential (primary) hypertension: Secondary | ICD-10-CM | POA: Diagnosis not present

## 2019-07-06 DIAGNOSIS — C090 Malignant neoplasm of tonsillar fossa: Secondary | ICD-10-CM | POA: Diagnosis not present

## 2019-07-06 DIAGNOSIS — Z510 Encounter for antineoplastic radiation therapy: Secondary | ICD-10-CM | POA: Diagnosis not present

## 2019-07-06 DIAGNOSIS — K219 Gastro-esophageal reflux disease without esophagitis: Secondary | ICD-10-CM | POA: Diagnosis not present

## 2019-07-06 DIAGNOSIS — Z931 Gastrostomy status: Secondary | ICD-10-CM | POA: Diagnosis not present

## 2019-07-06 DIAGNOSIS — R110 Nausea: Secondary | ICD-10-CM | POA: Diagnosis not present

## 2019-07-06 DIAGNOSIS — Z87442 Personal history of urinary calculi: Secondary | ICD-10-CM | POA: Diagnosis not present

## 2019-07-06 DIAGNOSIS — E559 Vitamin D deficiency, unspecified: Secondary | ICD-10-CM | POA: Diagnosis not present

## 2019-07-06 DIAGNOSIS — Z5111 Encounter for antineoplastic chemotherapy: Secondary | ICD-10-CM | POA: Diagnosis not present

## 2019-07-06 DIAGNOSIS — Z79899 Other long term (current) drug therapy: Secondary | ICD-10-CM | POA: Diagnosis not present

## 2019-07-06 DIAGNOSIS — F329 Major depressive disorder, single episode, unspecified: Secondary | ICD-10-CM | POA: Diagnosis not present

## 2019-07-06 DIAGNOSIS — Z923 Personal history of irradiation: Secondary | ICD-10-CM | POA: Diagnosis not present

## 2019-07-06 LAB — CBC WITH DIFFERENTIAL (CANCER CENTER ONLY)
Abs Immature Granulocytes: 0.03 10*3/uL (ref 0.00–0.07)
Basophils Absolute: 0 10*3/uL (ref 0.0–0.1)
Basophils Relative: 0 %
Eosinophils Absolute: 0.1 10*3/uL (ref 0.0–0.5)
Eosinophils Relative: 1 %
HCT: 41.3 % (ref 39.0–52.0)
Hemoglobin: 13.3 g/dL (ref 13.0–17.0)
Immature Granulocytes: 0 %
Lymphocytes Relative: 9 %
Lymphs Abs: 0.7 10*3/uL (ref 0.7–4.0)
MCH: 27 pg (ref 26.0–34.0)
MCHC: 32.2 g/dL (ref 30.0–36.0)
MCV: 83.9 fL (ref 80.0–100.0)
Monocytes Absolute: 0.5 10*3/uL (ref 0.1–1.0)
Monocytes Relative: 7 %
Neutro Abs: 5.8 10*3/uL (ref 1.7–7.7)
Neutrophils Relative %: 83 %
Platelet Count: 202 10*3/uL (ref 150–400)
RBC: 4.92 MIL/uL (ref 4.22–5.81)
RDW: 14.7 % (ref 11.5–15.5)
WBC Count: 7.1 10*3/uL (ref 4.0–10.5)
nRBC: 0 % (ref 0.0–0.2)

## 2019-07-06 LAB — CMP (CANCER CENTER ONLY)
ALT: 47 U/L — ABNORMAL HIGH (ref 0–44)
AST: 31 U/L (ref 15–41)
Albumin: 4.1 g/dL (ref 3.5–5.0)
Alkaline Phosphatase: 63 U/L (ref 38–126)
Anion gap: 8 (ref 5–15)
BUN: 14 mg/dL (ref 6–20)
CO2: 29 mmol/L (ref 22–32)
Calcium: 9.3 mg/dL (ref 8.9–10.3)
Chloride: 101 mmol/L (ref 98–111)
Creatinine: 0.93 mg/dL (ref 0.61–1.24)
GFR, Est AFR Am: >60 mL/min (ref >60)
GFR, Estimated: >60 mL/min (ref >60)
Glucose, Bld: 93 mg/dL (ref 70–99)
Potassium: 4.6 mmol/L (ref 3.5–5.1)
Sodium: 138 mmol/L (ref 135–145)
Total Bilirubin: 0.5 mg/dL (ref 0.3–1.2)
Total Protein: 7.9 g/dL (ref 6.5–8.1)

## 2019-07-06 LAB — MAGNESIUM: Magnesium: 1.9 mg/dL (ref 1.7–2.4)

## 2019-07-07 ENCOUNTER — Inpatient Hospital Stay: Payer: BC Managed Care – PPO

## 2019-07-07 ENCOUNTER — Other Ambulatory Visit: Payer: BC Managed Care – PPO

## 2019-07-07 ENCOUNTER — Other Ambulatory Visit: Payer: Self-pay

## 2019-07-07 ENCOUNTER — Ambulatory Visit
Admission: RE | Admit: 2019-07-07 | Discharge: 2019-07-07 | Disposition: A | Discharge status: Home / Self Care | Payer: BC Managed Care – PPO | Source: Ambulatory Visit | Attending: Radiation Oncology | Admitting: Radiation Oncology

## 2019-07-07 VITALS — BP 130/80 | HR 82 | Temp 98.2°F | Resp 18 | Wt 291.8 lb

## 2019-07-07 DIAGNOSIS — K219 Gastro-esophageal reflux disease without esophagitis: Secondary | ICD-10-CM | POA: Diagnosis not present

## 2019-07-07 DIAGNOSIS — E559 Vitamin D deficiency, unspecified: Secondary | ICD-10-CM | POA: Diagnosis not present

## 2019-07-07 DIAGNOSIS — Z510 Encounter for antineoplastic radiation therapy: Secondary | ICD-10-CM | POA: Diagnosis not present

## 2019-07-07 DIAGNOSIS — Z923 Personal history of irradiation: Secondary | ICD-10-CM | POA: Diagnosis not present

## 2019-07-07 DIAGNOSIS — Z79899 Other long term (current) drug therapy: Secondary | ICD-10-CM | POA: Diagnosis not present

## 2019-07-07 DIAGNOSIS — F329 Major depressive disorder, single episode, unspecified: Secondary | ICD-10-CM | POA: Diagnosis not present

## 2019-07-07 DIAGNOSIS — I10 Essential (primary) hypertension: Secondary | ICD-10-CM | POA: Diagnosis not present

## 2019-07-07 DIAGNOSIS — Z931 Gastrostomy status: Secondary | ICD-10-CM | POA: Diagnosis not present

## 2019-07-07 DIAGNOSIS — Z5111 Encounter for antineoplastic chemotherapy: Secondary | ICD-10-CM | POA: Diagnosis not present

## 2019-07-07 DIAGNOSIS — Z87442 Personal history of urinary calculi: Secondary | ICD-10-CM | POA: Diagnosis not present

## 2019-07-07 DIAGNOSIS — R110 Nausea: Secondary | ICD-10-CM | POA: Diagnosis not present

## 2019-07-07 DIAGNOSIS — C090 Malignant neoplasm of tonsillar fossa: Secondary | ICD-10-CM | POA: Diagnosis not present

## 2019-07-07 MED ORDER — DEXAMETHASONE SODIUM PHOSPHATE 10 MG/ML IJ SOLN
10.0000 mg | Freq: Once | INTRAMUSCULAR | Status: AC
  Administered 2019-07-07: 12:00:00 10 mg via INTRAVENOUS

## 2019-07-07 MED ORDER — PALONOSETRON HCL INJECTION 0.25 MG/5ML
INTRAVENOUS | Status: AC
  Filled 2019-07-07: qty 5

## 2019-07-07 MED ORDER — SODIUM CHLORIDE 0.9% FLUSH
10.0000 mL | INTRAVENOUS | Status: DC | PRN
  Administered 2019-07-07: 16:00:00 10 mL
  Filled 2019-07-07: qty 10

## 2019-07-07 MED ORDER — DEXAMETHASONE SODIUM PHOSPHATE 10 MG/ML IJ SOLN
INTRAMUSCULAR | Status: AC
  Filled 2019-07-07: qty 1

## 2019-07-07 MED ORDER — POTASSIUM CHLORIDE 2 MEQ/ML IV SOLN
Freq: Once | INTRAVENOUS | Status: AC
  Filled 2019-07-07: qty 10

## 2019-07-07 MED ORDER — SODIUM CHLORIDE 0.9 % IV SOLN
Freq: Once | INTRAVENOUS | Status: AC
  Filled 2019-07-07: qty 250

## 2019-07-07 MED ORDER — PALONOSETRON HCL INJECTION 0.25 MG/5ML
0.2500 mg | Freq: Once | INTRAVENOUS | Status: AC
  Administered 2019-07-07: 12:00:00 0.25 mg via INTRAVENOUS

## 2019-07-07 MED ORDER — SODIUM CHLORIDE 0.9 % IV SOLN
150.0000 mg | Freq: Once | INTRAVENOUS | Status: AC
  Administered 2019-07-07: 12:00:00 150 mg via INTRAVENOUS
  Filled 2019-07-07: qty 150

## 2019-07-07 MED ORDER — HEPARIN SOD (PORK) LOCK FLUSH 100 UNIT/ML IV SOLN
500.0000 [IU] | Freq: Once | INTRAVENOUS | Status: AC | PRN
  Administered 2019-07-07: 16:00:00 500 [IU]
  Filled 2019-07-07: qty 5

## 2019-07-07 MED ORDER — SODIUM CHLORIDE 0.9 % IV SOLN
38.5000 mg/m2 | Freq: Once | INTRAVENOUS | Status: AC
  Administered 2019-07-07: 100 mg via INTRAVENOUS
  Filled 2019-07-07: qty 100

## 2019-07-07 NOTE — Patient Instructions (Signed)
[facility] Discharge Instructions for Patients Receiving Chemotherapy  Today you received the following chemotherapy agents Cisplatin  To help prevent nausea and vomiting after your treatment, we encourage you to take your nausea medication as directed  If you develop nausea and vomiting that is not controlled by your nausea medication, call the clinic.   BELOW ARE SYMPTOMS THAT SHOULD BE REPORTED IMMEDIATELY:  *FEVER GREATER THAN 100.5 F  *CHILLS WITH OR WITHOUT FEVER  NAUSEA AND VOMITING THAT IS NOT CONTROLLED WITH YOUR NAUSEA MEDICATION  *UNUSUAL SHORTNESS OF BREATH  *UNUSUAL BRUISING OR BLEEDING  TENDERNESS IN MOUTH AND THROAT WITH OR WITHOUT PRESENCE OF ULCERS  *URINARY PROBLEMS  *BOWEL PROBLEMS  UNUSUAL RASH Items with * indicate a potential emergency and should be followed up as soon as possible.  Feel free to call the clinic should you have any questions or concerns. The clinic phone number is [phone].  Please show the CHEMO ALERT CARD at check-in to the Emergency Department and triage nurse.   

## 2019-07-10 ENCOUNTER — Ambulatory Visit
Admission: RE | Admit: 2019-07-10 | Discharge: 2019-07-10 | Disposition: A | Discharge status: Home / Self Care | Payer: BC Managed Care – PPO | Source: Ambulatory Visit | Attending: Radiation Oncology | Admitting: Radiation Oncology

## 2019-07-10 ENCOUNTER — Other Ambulatory Visit: Payer: Self-pay

## 2019-07-10 DIAGNOSIS — Z510 Encounter for antineoplastic radiation therapy: Secondary | ICD-10-CM | POA: Diagnosis not present

## 2019-07-10 DIAGNOSIS — C090 Malignant neoplasm of tonsillar fossa: Secondary | ICD-10-CM | POA: Diagnosis not present

## 2019-07-11 ENCOUNTER — Other Ambulatory Visit: Payer: Self-pay

## 2019-07-11 ENCOUNTER — Ambulatory Visit
Admission: RE | Admit: 2019-07-11 | Discharge: 2019-07-11 | Disposition: A | Discharge status: Home / Self Care | Payer: BC Managed Care – PPO | Source: Ambulatory Visit | Attending: Radiation Oncology | Admitting: Radiation Oncology

## 2019-07-11 DIAGNOSIS — C090 Malignant neoplasm of tonsillar fossa: Secondary | ICD-10-CM | POA: Diagnosis not present

## 2019-07-11 DIAGNOSIS — Z510 Encounter for antineoplastic radiation therapy: Secondary | ICD-10-CM | POA: Diagnosis not present

## 2019-07-12 ENCOUNTER — Inpatient Hospital Stay: Payer: BC Managed Care – PPO

## 2019-07-12 ENCOUNTER — Other Ambulatory Visit: Payer: Self-pay

## 2019-07-12 ENCOUNTER — Ambulatory Visit
Admission: RE | Admit: 2019-07-12 | Discharge: 2019-07-12 | Disposition: A | Discharge status: Home / Self Care | Payer: BC Managed Care – PPO | Source: Ambulatory Visit | Attending: Radiation Oncology | Admitting: Radiation Oncology

## 2019-07-12 DIAGNOSIS — C090 Malignant neoplasm of tonsillar fossa: Secondary | ICD-10-CM | POA: Diagnosis not present

## 2019-07-12 DIAGNOSIS — Z510 Encounter for antineoplastic radiation therapy: Secondary | ICD-10-CM | POA: Diagnosis not present

## 2019-07-12 NOTE — Progress Notes (Signed)
Nutrition Follow-up:  Patient with carcinoma of tonsillar fossa.  Patient receiving chemotherapy and radiation therapy.  Last chemotherapy planned for 2/12 and last radiation planned for 2/23.    Met with patient after radiation this am.  Patient reports painful swallowing especially last night.  Reports that ate baked chicken, broccoli and baked potato and if felt like sandpaper going down.  Reports that he had to drink fluids after every bite.  Has been taking pain medications.  Lunch yesterday was whopper with onion rings. Breakfast was oatmeal and cup of velveta shells and cheese (microwave cup).  Drank an ensure enlive yesterday as well.    Medications: reviewed  Labs: reviewed  Anthropometrics:   Weight 289 lb 4 oz on 2/8 in [facility]  Slight decrease from 291 lb on 2/1 in [facility] 1/25 291 in [facility] 1/22 weight 295 lb (??) 1/15 weight 289 lb 4 oz 12/23 weight 291 lb  Overall weight stable only slight decrease.    Estimated Energy Needs  Kcals: 4403-4742 Protein: 121-162 g Fluid: > 2.9 L  NUTRITION DIAGNOSIS: Predicted suboptimal energy intake continues   INTERVENTION:  Patient will focus on soft, moist foods for easier swallowing.  Patient aware that he can begin using feeding tube 1-2 times per day if intake decreases in the next week.  Feeding should be 3-4 hours apart.   Patient has contact information    MONITORING, EVALUATION, GOAL: Patient will consume adequate calories and protein to maintain muscle mass during treatment.    NEXT VISIT: Wed, Feb 17th after radiation  Rhythm Gubbels B. Zenia Resides, [place], [place] Registered Dietitian [phone] (pager)

## 2019-07-13 ENCOUNTER — Inpatient Hospital Stay (HOSPITAL_BASED_OUTPATIENT_CLINIC_OR_DEPARTMENT_OTHER): Payer: BC Managed Care – PPO | Admitting: Hematology and Oncology

## 2019-07-13 ENCOUNTER — Ambulatory Visit
Admission: RE | Admit: 2019-07-13 | Discharge: 2019-07-13 | Disposition: A | Discharge status: Home / Self Care | Payer: BC Managed Care – PPO | Source: Ambulatory Visit | Attending: Radiation Oncology | Admitting: Radiation Oncology

## 2019-07-13 ENCOUNTER — Encounter: Payer: Self-pay | Admitting: Hematology and Oncology

## 2019-07-13 ENCOUNTER — Inpatient Hospital Stay: Payer: BC Managed Care – PPO

## 2019-07-13 ENCOUNTER — Other Ambulatory Visit: Payer: Self-pay

## 2019-07-13 VITALS — BP 125/83 | HR 79 | Temp 97.8°F | Resp 18 | Ht 72.0 in | Wt 287.1 lb

## 2019-07-13 DIAGNOSIS — I10 Essential (primary) hypertension: Secondary | ICD-10-CM | POA: Diagnosis not present

## 2019-07-13 DIAGNOSIS — Z510 Encounter for antineoplastic radiation therapy: Secondary | ICD-10-CM | POA: Diagnosis not present

## 2019-07-13 DIAGNOSIS — Z5111 Encounter for antineoplastic chemotherapy: Secondary | ICD-10-CM | POA: Diagnosis not present

## 2019-07-13 DIAGNOSIS — E559 Vitamin D deficiency, unspecified: Secondary | ICD-10-CM | POA: Diagnosis not present

## 2019-07-13 DIAGNOSIS — Z87442 Personal history of urinary calculi: Secondary | ICD-10-CM | POA: Diagnosis not present

## 2019-07-13 DIAGNOSIS — Z79899 Other long term (current) drug therapy: Secondary | ICD-10-CM | POA: Diagnosis not present

## 2019-07-13 DIAGNOSIS — F329 Major depressive disorder, single episode, unspecified: Secondary | ICD-10-CM | POA: Diagnosis not present

## 2019-07-13 DIAGNOSIS — Z931 Gastrostomy status: Secondary | ICD-10-CM | POA: Diagnosis not present

## 2019-07-13 DIAGNOSIS — R110 Nausea: Secondary | ICD-10-CM | POA: Diagnosis not present

## 2019-07-13 DIAGNOSIS — K219 Gastro-esophageal reflux disease without esophagitis: Secondary | ICD-10-CM | POA: Diagnosis not present

## 2019-07-13 DIAGNOSIS — C090 Malignant neoplasm of tonsillar fossa: Secondary | ICD-10-CM

## 2019-07-13 DIAGNOSIS — Z923 Personal history of irradiation: Secondary | ICD-10-CM | POA: Diagnosis not present

## 2019-07-13 LAB — CBC WITH DIFFERENTIAL (CANCER CENTER ONLY)
Abs Immature Granulocytes: 0.01 10*3/uL (ref 0.00–0.07)
Basophils Absolute: 0 10*3/uL (ref 0.0–0.1)
Basophils Relative: 0 %
Eosinophils Absolute: 0 10*3/uL (ref 0.0–0.5)
Eosinophils Relative: 1 %
HCT: 38.5 % — ABNORMAL LOW (ref 39.0–52.0)
Hemoglobin: 12.9 g/dL — ABNORMAL LOW (ref 13.0–17.0)
Immature Granulocytes: 0 %
Lymphocytes Relative: 7 %
Lymphs Abs: 0.4 10*3/uL — ABNORMAL LOW (ref 0.7–4.0)
MCH: 28 pg (ref 26.0–34.0)
MCHC: 33.5 g/dL (ref 30.0–36.0)
MCV: 83.5 fL (ref 80.0–100.0)
Monocytes Absolute: 0.3 10*3/uL (ref 0.1–1.0)
Monocytes Relative: 6 %
Neutro Abs: 4.7 10*3/uL (ref 1.7–7.7)
Neutrophils Relative %: 86 %
Platelet Count: 150 10*3/uL (ref 150–400)
RBC: 4.61 MIL/uL (ref 4.22–5.81)
RDW: 15.5 % (ref 11.5–15.5)
WBC Count: 5.5 10*3/uL (ref 4.0–10.5)
nRBC: 0 % (ref 0.0–0.2)

## 2019-07-13 LAB — CMP (CANCER CENTER ONLY)
ALT: 46 U/L — ABNORMAL HIGH (ref 0–44)
AST: 27 U/L (ref 15–41)
Albumin: 3.9 g/dL (ref 3.5–5.0)
Alkaline Phosphatase: 73 U/L (ref 38–126)
Anion gap: 8 (ref 5–15)
BUN: 15 mg/dL (ref 6–20)
CO2: 26 mmol/L (ref 22–32)
Calcium: 9.1 mg/dL (ref 8.9–10.3)
Chloride: 103 mmol/L (ref 98–111)
Creatinine: 0.85 mg/dL (ref 0.61–1.24)
GFR, Est AFR Am: >60 mL/min (ref >60)
GFR, Estimated: >60 mL/min (ref >60)
Glucose, Bld: 95 mg/dL (ref 70–99)
Potassium: 4.3 mmol/L (ref 3.5–5.1)
Sodium: 137 mmol/L (ref 135–145)
Total Bilirubin: 0.4 mg/dL (ref 0.3–1.2)
Total Protein: 7.7 g/dL (ref 6.5–8.1)

## 2019-07-13 LAB — MAGNESIUM: Magnesium: 1.8 mg/dL (ref 1.7–2.4)

## 2019-07-13 MED ORDER — OXYCODONE HCL 5 MG PO TABS: 5.0000 mg | ORAL_TABLET | ORAL | 0 refills | Status: DC | PRN

## 2019-07-13 MED ORDER — SUCRALFATE 1 G PO TABS: 1.0000 g | ORAL_TABLET | Freq: Three times a day (TID) | ORAL | 1 refills | Status: DC

## 2019-07-13 MED ORDER — SENNOSIDES-DOCUSATE SODIUM 8.6-50 MG PO TABS: 1.0000 | ORAL_TABLET | Freq: Two times a day (BID) | ORAL | 3 refills | Status: DC | PRN

## 2019-07-13 NOTE — Progress Notes (Signed)
[facility] [phone]   [phone]  PROGRESS NOTE  Patient Care Team: [name], DO as PCP - General (Family Medicine) Izora Gala, MD as Consulting Physician (Otolaryngology) Eppie Gibson, MD as Attending Physician (Radiation Oncology) Leota Sauers, RN as Registered Nurse Orson Slick, MD as Consulting Physician (Hematology and Oncology) Sharen Counter, CCC-SLP as Speech Language Pathologist (Speech Pathology) Karie Mainland, RD as Dietitian (Nutrition) Wynelle Beckmann, Melodie Bouillon, PT as Physical Therapist (Physical Therapy) Kennith Center, LCSW as Social Worker  Hematological/Oncological History # 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 CT scan performed showing 4.1 x 4.0 x 5.3 cm mass centered at the right palatine tonsil, highly concerning for primary head and neck malignancy, likely squamous cell carcinoma, additionally there were partially necrotic right level II and III adenopathy. 2) 04/26/2019: referred to ENT, underwent right tonsillar biopsy confirming invasive squamous cell cancer 3) 05/17/2019: PET CT scan showed hypermetabolic mass at right palantine tonsil and hypermetabolic right level 2 and 3 lymph nodes. There was also mildly hypermetabolic lymph node in the left level 2 neck.  4) 05/24/2019: establish care with Dr. Lorenso Courier  5) 06/07/2019: started radiation therapy with Dr. Isidore Moos 6) 06/16/2019: Started Cisplatin 40mg /m2 with concurrent radiation 7) 06/23/2019: Week 2 cisplatin 40mg /m2 with concurrent radiation 9) 06/30/2019: Week 3 cisplatin 40mg /m2 with concurrent radiation 10) 07/07/2019: Week 4 cisplatin 40mg /m2 with concurrent radiation 11) 07/14/2019: intended Week 5 cisplatin 40mg /m2 with concurrent radiation  Interval History:  Eric Weaver 49 y.o. male with medical history significant for carcinoma of the tonsillar  fossa  presents for a follow up visit. The patient's last visit was on 06/29/2019 prior to the start of week 3 of chemotherapy. In the interim since the last visit he has received 4 weekly doses of cisplatin with an additional 2 weeks planned.   On exam today [name] notes that he continues to be well with the exception of throat pain.  He notes that it has been difficult to eat, though he does report that he has received all of his nutrition PO with none via his G tube.  He reports that he has been taking his oxycodone 5mg  as prescribed every 6 hours, though he notes that as the pain increases he has been moving more towards every 4 hours.  He estimates that he may need to start taking 3 pills in the morning and 3 at night. He is taking this with 200mg  x 3 pills of ibuprofen.  He notes that he has not had any nausea and has not required his nausea medications.  He notes that he does not have any diarrhea, however his bowels have been somewhat slow when he began taking a laxative yesterday.  He reports that with nearly every bite of food he drinks water.  He is only lost 3 pounds since 28 January.  As for his G-tube he notes that there has been some tenderness and erythema around the insertion site which his wife has been applying antibacterial cream to.  There has been some improvement with this.  He notes a rare ringing in his ears which dissipates quickly.  He reports that he is tolerating chemotherapy well with no other concerns or complaints today.  A full 10 point ROS is listed below.  MEDICAL HISTORY:  Past Medical History:  Diagnosis Date  . Depression   . Essential hypertension  08/02/2018  . GERD (gastroesophageal reflux disease)   . History of nephrolithiasis   . Vitamin D deficiency     SURGICAL HISTORY: Past Surgical History:  Procedure Laterality Date  . Biopsy of right tonsil Right 04/24/2019   Positive for squamous cell carcinoma  . IR GASTROSTOMY TUBE MOD SED  06/06/2019  . IR  IMAGING GUIDED PORT INSERTION  06/06/2019    ALLERGIES:  has No Known Allergies.  MEDICATIONS:  Current Outpatient Medications  Medication Sig Dispense Refill  . escitalopram (LEXAPRO) 20 MG tablet Take 1 tablet (20 mg total) by mouth daily. (Needs to be seen before next refill) 30 tablet 0  . esomeprazole (NEXIUM) 20 MG capsule Take 1 capsule (20 mg total) by mouth daily at 12 noon. 90 capsule 3  . ipratropium (ATROVENT) 0.06 % nasal spray Place 2 sprays into both nostrils 4 (four) times daily. (Patient not taking: Reported on 05/17/2019) 15 mL 0  . lidocaine (XYLOCAINE) 2 % solution Patient: Mix 1part 2% viscous lidocaine, 1part H20. Swish & swallow 8mL of diluted mixture, 24min before meals and at bedtime, up to QID 200 mL 4  . lidocaine-prilocaine (EMLA) cream Apply 1 application topically as needed. 30 g 2  . ondansetron (ZOFRAN) 8 MG tablet Take 1 tablet (8 mg total) by mouth every 8 (eight) hours as needed for nausea or vomiting. 30 tablet 1  . oxyCODONE (OXY IR/ROXICODONE) 5 MG immediate release tablet Take 1 tablet (5 mg total) by mouth every 4 (four) hours as needed for severe pain. 60 tablet 0  . prochlorperazine (COMPAZINE) 10 MG tablet Take 1 tablet (10 mg total) by mouth every 6 (six) hours as needed for nausea or vomiting. 30 tablet 1  . senna-docusate (SENOKOT-S) 8.6-50 MG tablet Take 1 tablet by mouth 2 (two) times daily as needed for mild constipation. 30 tablet 3  . sodium fluoride (PREVIDENT 5000 PLUS) 1.1 % CREA dental cream Apply to tooth brush. Brush teeth for 2 minutes. Spit out excess. So NOT swallow. Repeat nightly. 1 Tube prn  . sucralfate (CARAFATE) 1 g tablet Take 1 tablet (1 g total) by mouth 4 (four) times daily -  with meals and at bedtime. 120 tablet 1  . tadalafil (CIALIS) 5 MG tablet Take 5 mg by mouth daily as needed.    . Vitamin D, Ergocalciferol, (DRISDOL) 1.25 MG (50000 UT) CAPS capsule TAKE 1 CAPSULE BY MOUTH WEEKLY 12 capsule 1   No current  facility-administered medications for this visit.    REVIEW OF SYSTEMS:   Constitutional: ( - ) fevers, ( - )  chills , ( - ) night sweats Eyes: ( - ) blurriness of vision, ( - ) double vision, ( - ) watery eyes Ears, nose, mouth, throat, and face: ( - ) mucositis, ( + ) sore throat Respiratory: ( - ) cough, ( - ) dyspnea, ( - ) wheezes Cardiovascular: ( - ) palpitation, ( - ) chest discomfort, ( - ) lower extremity swelling Gastrointestinal:  ( - ) nausea, ( - ) heartburn, ( - ) change in bowel habits Skin: ( - ) abnormal skin rashes Lymphatics: ( - ) new lymphadenopathy, ( - ) easy bruising Neurological: ( - ) numbness, ( - ) tingling, ( - ) new weaknesses Behavioral/Psych: ( - ) mood change, ( - ) new changes  All other systems were reviewed with the patient and are negative.  PHYSICAL EXAMINATION: ECOG PERFORMANCE STATUS: 1 - Symptomatic but completely ambulatory  Vitals:  07/13/19 1133  BP: 125/83  Pulse: 79  Resp: 18  Temp: 97.8 F (36.6 C)  SpO2: 100%   Filed Weights   07/13/19 1133  Weight: 287 lb 1.6 oz (130.2 kg)    GENERAL: well appearing obese Caucasian male in NAD  SKIN: skin color, texture, turgor are normal, no rashes or significant lesions EYES: conjunctiva are pink and non-injected, sclera clear LUNGS: clear to auscultation and percussion with normal breathing effort HEART: regular rate & rhythm and no murmurs and no lower extremity edema ABDOMEN: soft, non-tender, non-distended, normal bowel sounds. G tube in place with some surrounding erythema Musculoskeletal: no cyanosis of digits and no clubbing  PSYCH: alert & oriented x 3, fluent speech NEURO: no focal motor/sensory deficits  LABORATORY DATA:  I have reviewed the data as listed CBC Latest Ref Rng & Units 07/13/2019 07/06/2019 06/29/2019  WBC 4.0 - 10.5 K/uL 5.5 7.1 7.0  Hemoglobin 13.0 - 17.0 g/dL 12.9(L) 13.3 13.0  Hematocrit 39.0 - 52.0 % 38.5(L) 41.3 39.8  Platelets 150 - 400 K/uL 150 202 267      CMP Latest Ref Rng & Units 07/13/2019 07/06/2019 06/29/2019  Glucose 70 - 99 mg/dL 95 93 73  BUN 6 - 20 mg/dL 15 14 17   Creatinine 0.61 - 1.24 mg/dL 0.85 0.93 0.92  Sodium 135 - 145 mmol/L 137 138 140  Potassium 3.5 - 5.1 mmol/L 4.3 4.6 4.3  Chloride 98 - 111 mmol/L 103 101 104  CO2 22 - 32 mmol/L 26 29 27   Calcium 8.9 - 10.3 mg/dL 9.1 9.3 9.1  Total Protein 6.5 - 8.1 g/dL 7.7 7.9 7.8  Total Bilirubin 0.3 - 1.2 mg/dL 0.4 0.5 0.3  Alkaline Phos 38 - 126 U/L 73 63 64  AST 15 - 41 U/L 27 31 32  ALT 0 - 44 U/L 46(H) 47(H) 48(H)    RADIOGRAPHIC STUDIES: No results found.  ASSESSMENT & PLAN Eric Weaver 49 y.o. male with medical history significant for carcinoma of the tonsillar fossa  presents for a follow up visit.  After review the labs and discussion with the patient we have planned to continue proceeding forward with cisplatin 40 mg/m for an additional 2 doses.  His next dose is scheduled for tomorrow with 1 additional doses after that time.  The patient is currently tolerating therapy well with minimal changes to his labs and the only adverse effect being painful swallowing which is to be expected.  Fortunately at this time the patient is still acquiring all of his nutrition p.o. we will continue to follow him closely and plan to see him shortly after completion of chemoradiation.  # Carcinoma of the Tonsillar Fossa (Stage II, T3N2M0 p16+) --continue cisplatin 40mg /m2 as planned. Tomorrow will be Week 5 of treatment. He has a total of 6 chemotherapy treatments planned with concurrent radiation. Radiation therapy to end on 07/25/2019 --Hgb and Cr are stable on therapy. Minimal nausea. Continue at current dosages.  --after completion of chemoradiation we will reassess in 12-16 weeks to assure response to therapy. PET CT preferred.  --Plan for RTC on 07/27/2019 with labs   #Throat Pain, stable --continue oxycodone 5-10mg  q6 H PRN. Refill medication today. Provide prescription for PRN  senna-docusate.  --provide with sucralfate prescription to help ease throat pain (recommend it be grounded up). --encourage continued PO intake  --G-tube in place for feeding if required, but patient has not used it yet  #Nutritional Status, stable --continues to intake PO only at this time --continues  to flush PEG tube as recommended.  --has currently lost 3 lb since 06/29/2019 prior to treatment --minimal nausea reported, compazine PRN  No orders of the defined types were placed in this encounter.  All questions were answered. The patient knows to call the clinic with any problems, questions or concerns.  A total of more than 30 minutes were spent on this encounter and over half of that time was spent on counseling and coordination of care as outlined above.   Ledell Peoples, MD Department of Hematology/Oncology [place] at [facility] Phone: [phone] Pager: [phone] Email: [email or web address]  07/13/2019 1:21 PM

## 2019-07-14 ENCOUNTER — Other Ambulatory Visit: Payer: Self-pay

## 2019-07-14 ENCOUNTER — Telehealth: Payer: Self-pay | Admitting: Hematology and Oncology

## 2019-07-14 ENCOUNTER — Inpatient Hospital Stay: Payer: BC Managed Care – PPO

## 2019-07-14 ENCOUNTER — Other Ambulatory Visit: Payer: Self-pay | Admitting: Hematology and Oncology

## 2019-07-14 ENCOUNTER — Ambulatory Visit
Admission: RE | Admit: 2019-07-14 | Discharge: 2019-07-14 | Disposition: A | Discharge status: Home / Self Care | Payer: BC Managed Care – PPO | Source: Ambulatory Visit | Attending: Radiation Oncology | Admitting: Radiation Oncology

## 2019-07-14 VITALS — BP 131/83 | HR 75 | Temp 97.8°F | Resp 18

## 2019-07-14 DIAGNOSIS — Z87442 Personal history of urinary calculi: Secondary | ICD-10-CM | POA: Diagnosis not present

## 2019-07-14 DIAGNOSIS — Z931 Gastrostomy status: Secondary | ICD-10-CM | POA: Diagnosis not present

## 2019-07-14 DIAGNOSIS — I10 Essential (primary) hypertension: Secondary | ICD-10-CM | POA: Diagnosis not present

## 2019-07-14 DIAGNOSIS — Z923 Personal history of irradiation: Secondary | ICD-10-CM | POA: Diagnosis not present

## 2019-07-14 DIAGNOSIS — Z510 Encounter for antineoplastic radiation therapy: Secondary | ICD-10-CM | POA: Diagnosis not present

## 2019-07-14 DIAGNOSIS — C090 Malignant neoplasm of tonsillar fossa: Secondary | ICD-10-CM | POA: Diagnosis not present

## 2019-07-14 DIAGNOSIS — F329 Major depressive disorder, single episode, unspecified: Secondary | ICD-10-CM | POA: Diagnosis not present

## 2019-07-14 DIAGNOSIS — K219 Gastro-esophageal reflux disease without esophagitis: Secondary | ICD-10-CM | POA: Diagnosis not present

## 2019-07-14 DIAGNOSIS — Z5111 Encounter for antineoplastic chemotherapy: Secondary | ICD-10-CM | POA: Diagnosis not present

## 2019-07-14 DIAGNOSIS — Z79899 Other long term (current) drug therapy: Secondary | ICD-10-CM | POA: Diagnosis not present

## 2019-07-14 DIAGNOSIS — E559 Vitamin D deficiency, unspecified: Secondary | ICD-10-CM | POA: Diagnosis not present

## 2019-07-14 DIAGNOSIS — R110 Nausea: Secondary | ICD-10-CM | POA: Diagnosis not present

## 2019-07-14 MED ORDER — SODIUM CHLORIDE 0.9 % IV SOLN
38.5000 mg/m2 | Freq: Once | INTRAVENOUS | Status: AC
  Administered 2019-07-14: 100 mg via INTRAVENOUS
  Filled 2019-07-14: qty 100

## 2019-07-14 MED ORDER — DEXAMETHASONE SODIUM PHOSPHATE 10 MG/ML IJ SOLN
10.0000 mg | Freq: Once | INTRAMUSCULAR | Status: AC
  Administered 2019-07-14: 10 mg via INTRAVENOUS

## 2019-07-14 MED ORDER — POTASSIUM CHLORIDE 2 MEQ/ML IV SOLN
Freq: Once | INTRAVENOUS | Status: AC
  Filled 2019-07-14: qty 10

## 2019-07-14 MED ORDER — PALONOSETRON HCL INJECTION 0.25 MG/5ML
INTRAVENOUS | Status: AC
  Filled 2019-07-14: qty 5

## 2019-07-14 MED ORDER — SODIUM CHLORIDE 0.9 % IV SOLN
150.0000 mg | Freq: Once | INTRAVENOUS | Status: AC
  Administered 2019-07-14: 12:00:00 150 mg via INTRAVENOUS
  Filled 2019-07-14: qty 150

## 2019-07-14 MED ORDER — SODIUM CHLORIDE 0.9% FLUSH
10.0000 mL | INTRAVENOUS | Status: DC | PRN
  Administered 2019-07-14: 16:00:00 10 mL
  Filled 2019-07-14: qty 10

## 2019-07-14 MED ORDER — SODIUM CHLORIDE 0.9 % IV SOLN
Freq: Once | INTRAVENOUS | Status: AC
  Filled 2019-07-14: qty 250

## 2019-07-14 MED ORDER — HEPARIN SOD (PORK) LOCK FLUSH 100 UNIT/ML IV SOLN
500.0000 [IU] | Freq: Once | INTRAVENOUS | Status: AC | PRN
  Administered 2019-07-14: 16:00:00 500 [IU]
  Filled 2019-07-14: qty 5

## 2019-07-14 MED ORDER — DEXAMETHASONE SODIUM PHOSPHATE 10 MG/ML IJ SOLN
INTRAMUSCULAR | Status: AC
  Filled 2019-07-14: qty 1

## 2019-07-14 MED ORDER — PALONOSETRON HCL INJECTION 0.25 MG/5ML
0.2500 mg | Freq: Once | INTRAVENOUS | Status: AC
  Administered 2019-07-14: 0.25 mg via INTRAVENOUS

## 2019-07-14 NOTE — Patient Instructions (Signed)
[facility] Discharge Instructions for Patients Receiving Chemotherapy  Today you received the following chemotherapy agents: Cisplatin (Platinol)  To help prevent nausea and vomiting after your treatment, we encourage you to take your nausea medication as directed.   If you develop nausea and vomiting that is not controlled by your nausea medication, call the clinic.   BELOW ARE SYMPTOMS THAT SHOULD BE REPORTED IMMEDIATELY:  *FEVER GREATER THAN 100.5 F  *CHILLS WITH OR WITHOUT FEVER  NAUSEA AND VOMITING THAT IS NOT CONTROLLED WITH YOUR NAUSEA MEDICATION  *UNUSUAL SHORTNESS OF BREATH  *UNUSUAL BRUISING OR BLEEDING  TENDERNESS IN MOUTH AND THROAT WITH OR WITHOUT PRESENCE OF ULCERS  *URINARY PROBLEMS  *BOWEL PROBLEMS  UNUSUAL RASH Items with * indicate a potential emergency and should be followed up as soon as possible.  Feel free to call the clinic should you have any questions or concerns. The clinic phone number is [phone].  Please show the [place] at check-in to the Emergency Department and triage nurse.  Coronavirus (COVID-19) Are you at risk?  Are you at risk for the Coronavirus (COVID-19)?  To be considered HIGH RISK for Coronavirus (COVID-19), you have to meet the following criteria:  . Traveled to [place], [place], [place], [place] or [place]; or in the [place] to [place], [place], [place], or [place]; and have fever, cough, and shortness of breath within the last 2 weeks of travel OR . Been in close contact with a person diagnosed with COVID-19 within the last 2 weeks and have fever, cough, and shortness of breath . IF YOU DO NOT MEET THESE CRITERIA, YOU ARE CONSIDERED LOW RISK FOR COVID-19.  What to do if you are HIGH RISK for COVID-19?  Marland Kitchen If you are having a medical emergency, call 911. . Seek medical care right away. Before you go to a doctor's office, urgent care or emergency department, call ahead and tell them  about your recent travel, contact with someone diagnosed with COVID-19, and your symptoms. You should receive instructions from your physician's office regarding next steps of care.  . When you arrive at healthcare provider, tell the healthcare staff immediately you have returned from visiting [place], [place], [place], [place] or [place]; or traveled in the [place] to [place], [place], [place], or [place]; in the last two weeks or you have been in close contact with a person diagnosed with COVID-19 in the last 2 weeks.   . Tell the health care staff about your symptoms: fever, cough and shortness of breath. . After you have been seen by a medical provider, you will be either: o Tested for (COVID-19) and discharged home on quarantine except to seek medical care if symptoms worsen, and asked to  - Stay home and avoid contact with others until you get your results (4-5 days)  - Avoid travel on public transportation if possible (such as bus, train, or airplane) or o Sent to the Emergency Department by EMS for evaluation, COVID-19 testing, and possible admission depending on your condition and test results.  What to do if you are LOW RISK for COVID-19?  Reduce your risk of any infection by using the same precautions used for avoiding the common cold or flu:  Marland Kitchen Wash your hands often with soap and warm water for at least 20 seconds.  If soap and water are not readily available, use an alcohol-based hand sanitizer with at least 60% alcohol.  . If coughing or  sneezing, cover your mouth and nose by coughing or sneezing into the elbow areas of your shirt or coat, into a tissue or into your sleeve (not your hands). . Avoid shaking hands with others and consider head nods or verbal greetings only. . Avoid touching your eyes, nose, or mouth with unwashed hands.  . Avoid close contact with people who are sick. . Avoid places or events with large numbers of people in one location, like concerts or  sporting events. . Carefully consider travel plans you have or are making. . If you are planning any travel outside or inside the [place], visit the CDC's Travelers' Health webpage for the latest health notices. . If you have some symptoms but not all symptoms, continue to monitor at home and seek medical attention if your symptoms worsen. . If you are having a medical emergency, call 911.   [place] / e-Visit: [email or web address]         [facility]: [facility]: [phone]                   [facility]: [phone]

## 2019-07-14 NOTE — Telephone Encounter (Signed)
Scheduled per los. Called and spoke with patient. Confirmed appt 

## 2019-07-17 ENCOUNTER — Other Ambulatory Visit: Payer: Self-pay

## 2019-07-17 ENCOUNTER — Ambulatory Visit
Admission: RE | Admit: 2019-07-17 | Discharge: 2019-07-17 | Disposition: A | Discharge status: Home / Self Care | Payer: BC Managed Care – PPO | Source: Ambulatory Visit | Attending: Radiation Oncology | Admitting: Radiation Oncology

## 2019-07-17 DIAGNOSIS — Z510 Encounter for antineoplastic radiation therapy: Secondary | ICD-10-CM | POA: Diagnosis not present

## 2019-07-17 DIAGNOSIS — C090 Malignant neoplasm of tonsillar fossa: Secondary | ICD-10-CM | POA: Diagnosis not present

## 2019-07-18 ENCOUNTER — Other Ambulatory Visit: Payer: Self-pay

## 2019-07-18 ENCOUNTER — Ambulatory Visit
Admission: RE | Admit: 2019-07-18 | Discharge: 2019-07-18 | Disposition: A | Discharge status: Home / Self Care | Payer: BC Managed Care – PPO | Source: Ambulatory Visit | Attending: Radiation Oncology | Admitting: Radiation Oncology

## 2019-07-18 DIAGNOSIS — Z510 Encounter for antineoplastic radiation therapy: Secondary | ICD-10-CM | POA: Diagnosis not present

## 2019-07-18 DIAGNOSIS — C090 Malignant neoplasm of tonsillar fossa: Secondary | ICD-10-CM | POA: Diagnosis not present

## 2019-07-19 ENCOUNTER — Inpatient Hospital Stay: Payer: BC Managed Care – PPO

## 2019-07-19 ENCOUNTER — Ambulatory Visit
Admission: RE | Admit: 2019-07-19 | Discharge: 2019-07-19 | Disposition: A | Discharge status: Home / Self Care | Payer: BC Managed Care – PPO | Source: Ambulatory Visit | Attending: Radiation Oncology | Admitting: Radiation Oncology

## 2019-07-19 ENCOUNTER — Other Ambulatory Visit: Payer: Self-pay

## 2019-07-19 DIAGNOSIS — Z510 Encounter for antineoplastic radiation therapy: Secondary | ICD-10-CM | POA: Diagnosis not present

## 2019-07-19 DIAGNOSIS — C090 Malignant neoplasm of tonsillar fossa: Secondary | ICD-10-CM | POA: Diagnosis not present

## 2019-07-19 NOTE — Progress Notes (Signed)
Nutrition Follow-up:  Patient with carcinoma of tonsillar fossa.  Patient receiving chemotherapy and radiation therapy.  Last chemotherapy planned for 2/19 and last radiation 2/23.  Met with patient following radiation treatment. Patient still "pushing through" and eating orally.  Reports no taste and pain on swallowing.  Did not eat breakfast yesterday but ate 2 corn dogs for lunch with mustard without difficulty and macaroni and cheese. Supper last night was hamburger steak, baked beans and mashed potatoes with cobbler and ice for dessert.  Also had protein shake yesterday.    Patient currently not using tube.  Flushing daily with water.      Medications: reviewed  Labs: reviewed  Anthropometrics:   Weight 287 lb on 2/15 noted in [facility]  289 lb 4 oz on 2/8 in [facility] 1/25 291 lb in [facility] 1/22 weight 295 lb (?? Accuracy) 1/15 weight 289 lb 4 oz  12/23 weight 291 lb   1% weight loss in almost 1 month and 3 weeks, not significant  Estimated Energy Needs  Kcals: 2900-3300 Protein: 121-162 g Fluid: > 2.9 L  NUTRITION DIAGNOSIS: Predicted suboptimal energy intake continues   INTERVENTION:  Patient will continue focusing on soft moist foods high in protein and calories for weight maintenance Patient aware that he can use feeding tube 1-2 times per day if intake decreases in the next week.  Feeding should be 3-4 hours apart. Patient has contact information    MONITORING, EVALUATION, GOAL: Patient will consume adequate calories and protein to maintain muscle mass during treatment   NEXT VISIT: Wed, Feb 24th phone f/u  Vu Liebman B. Zenia Resides, [place], [place] Registered Dietitian [phone] (pager)

## 2019-07-20 ENCOUNTER — Telehealth: Payer: Self-pay | Admitting: Emergency Medicine

## 2019-07-20 ENCOUNTER — Other Ambulatory Visit: Payer: BC Managed Care – PPO

## 2019-07-20 ENCOUNTER — Inpatient Hospital Stay: Payer: BC Managed Care – PPO

## 2019-07-20 ENCOUNTER — Ambulatory Visit
Admission: RE | Admit: 2019-07-20 | Discharge: 2019-07-20 | Disposition: A | Discharge status: Home / Self Care | Payer: BC Managed Care – PPO | Source: Ambulatory Visit | Attending: Radiation Oncology | Admitting: Radiation Oncology

## 2019-07-20 ENCOUNTER — Other Ambulatory Visit: Payer: Self-pay

## 2019-07-20 DIAGNOSIS — Z79899 Other long term (current) drug therapy: Secondary | ICD-10-CM | POA: Diagnosis not present

## 2019-07-20 DIAGNOSIS — R110 Nausea: Secondary | ICD-10-CM | POA: Diagnosis not present

## 2019-07-20 DIAGNOSIS — Z923 Personal history of irradiation: Secondary | ICD-10-CM | POA: Diagnosis not present

## 2019-07-20 DIAGNOSIS — Z931 Gastrostomy status: Secondary | ICD-10-CM | POA: Diagnosis not present

## 2019-07-20 DIAGNOSIS — C090 Malignant neoplasm of tonsillar fossa: Secondary | ICD-10-CM | POA: Diagnosis not present

## 2019-07-20 DIAGNOSIS — Z87442 Personal history of urinary calculi: Secondary | ICD-10-CM | POA: Diagnosis not present

## 2019-07-20 DIAGNOSIS — K219 Gastro-esophageal reflux disease without esophagitis: Secondary | ICD-10-CM | POA: Diagnosis not present

## 2019-07-20 DIAGNOSIS — Z510 Encounter for antineoplastic radiation therapy: Secondary | ICD-10-CM | POA: Diagnosis not present

## 2019-07-20 DIAGNOSIS — I10 Essential (primary) hypertension: Secondary | ICD-10-CM | POA: Diagnosis not present

## 2019-07-20 DIAGNOSIS — F329 Major depressive disorder, single episode, unspecified: Secondary | ICD-10-CM | POA: Diagnosis not present

## 2019-07-20 DIAGNOSIS — E559 Vitamin D deficiency, unspecified: Secondary | ICD-10-CM | POA: Diagnosis not present

## 2019-07-20 DIAGNOSIS — Z5111 Encounter for antineoplastic chemotherapy: Secondary | ICD-10-CM | POA: Diagnosis not present

## 2019-07-20 LAB — CMP (CANCER CENTER ONLY)
ALT: 43 U/L (ref 0–44)
AST: 27 U/L (ref 15–41)
Albumin: 4.1 g/dL (ref 3.5–5.0)
Alkaline Phosphatase: 72 U/L (ref 38–126)
Anion gap: 8 (ref 5–15)
BUN: 15 mg/dL (ref 6–20)
CO2: 26 mmol/L (ref 22–32)
Calcium: 9.4 mg/dL (ref 8.9–10.3)
Chloride: 102 mmol/L (ref 98–111)
Creatinine: 0.97 mg/dL (ref 0.61–1.24)
GFR, Est AFR Am: >60 mL/min (ref >60)
GFR, Estimated: >60 mL/min (ref >60)
Glucose, Bld: 108 mg/dL — ABNORMAL HIGH (ref 70–99)
Potassium: 4.7 mmol/L (ref 3.5–5.1)
Sodium: 136 mmol/L (ref 135–145)
Total Bilirubin: 0.4 mg/dL (ref 0.3–1.2)
Total Protein: 8 g/dL (ref 6.5–8.1)

## 2019-07-20 LAB — CBC WITH DIFFERENTIAL (CANCER CENTER ONLY)
Abs Immature Granulocytes: 0.01 10*3/uL (ref 0.00–0.07)
Basophils Absolute: 0 10*3/uL (ref 0.0–0.1)
Basophils Relative: 0 %
Eosinophils Absolute: 0 10*3/uL (ref 0.0–0.5)
Eosinophils Relative: 1 %
HCT: 38.2 % — ABNORMAL LOW (ref 39.0–52.0)
Hemoglobin: 12.6 g/dL — ABNORMAL LOW (ref 13.0–17.0)
Immature Granulocytes: 0 %
Lymphocytes Relative: 10 %
Lymphs Abs: 0.3 10*3/uL — ABNORMAL LOW (ref 0.7–4.0)
MCH: 27.7 pg (ref 26.0–34.0)
MCHC: 33 g/dL (ref 30.0–36.0)
MCV: 84 fL (ref 80.0–100.0)
Monocytes Absolute: 0.2 10*3/uL (ref 0.1–1.0)
Monocytes Relative: 6 %
Neutro Abs: 2.2 10*3/uL (ref 1.7–7.7)
Neutrophils Relative %: 83 %
Platelet Count: 180 10*3/uL (ref 150–400)
RBC: 4.55 MIL/uL (ref 4.22–5.81)
RDW: 15.8 % — ABNORMAL HIGH (ref 11.5–15.5)
WBC Count: 2.6 10*3/uL — ABNORMAL LOW (ref 4.0–10.5)
nRBC: 0 % (ref 0.0–0.2)

## 2019-07-20 LAB — MAGNESIUM: Magnesium: 1.7 mg/dL (ref 1.7–2.4)

## 2019-07-20 NOTE — Telephone Encounter (Signed)
Called pt regarding change in appt time on 2/19 from 0900 to 1000 d/t weather.  Pt agreed to time change for appt, aware that radiation will fit him in during his infusion.  Appt changed in pt's chart.

## 2019-07-21 ENCOUNTER — Other Ambulatory Visit: Payer: BC Managed Care – PPO

## 2019-07-21 ENCOUNTER — Other Ambulatory Visit: Payer: Self-pay

## 2019-07-21 ENCOUNTER — Other Ambulatory Visit: Payer: Self-pay | Admitting: Hematology and Oncology

## 2019-07-21 ENCOUNTER — Inpatient Hospital Stay: Payer: BC Managed Care – PPO

## 2019-07-21 ENCOUNTER — Ambulatory Visit
Admission: RE | Admit: 2019-07-21 | Discharge: 2019-07-21 | Disposition: A | Discharge status: Home / Self Care | Payer: BC Managed Care – PPO | Source: Ambulatory Visit | Attending: Radiation Oncology | Admitting: Radiation Oncology

## 2019-07-21 VITALS — BP 133/93 | HR 95 | Temp 98.7°F | Resp 16

## 2019-07-21 DIAGNOSIS — Z931 Gastrostomy status: Secondary | ICD-10-CM | POA: Diagnosis not present

## 2019-07-21 DIAGNOSIS — Z5111 Encounter for antineoplastic chemotherapy: Secondary | ICD-10-CM | POA: Diagnosis not present

## 2019-07-21 DIAGNOSIS — K219 Gastro-esophageal reflux disease without esophagitis: Secondary | ICD-10-CM | POA: Diagnosis not present

## 2019-07-21 DIAGNOSIS — Z87442 Personal history of urinary calculi: Secondary | ICD-10-CM | POA: Diagnosis not present

## 2019-07-21 DIAGNOSIS — R110 Nausea: Secondary | ICD-10-CM | POA: Diagnosis not present

## 2019-07-21 DIAGNOSIS — I10 Essential (primary) hypertension: Secondary | ICD-10-CM | POA: Diagnosis not present

## 2019-07-21 DIAGNOSIS — C090 Malignant neoplasm of tonsillar fossa: Secondary | ICD-10-CM

## 2019-07-21 DIAGNOSIS — F329 Major depressive disorder, single episode, unspecified: Secondary | ICD-10-CM | POA: Diagnosis not present

## 2019-07-21 DIAGNOSIS — E559 Vitamin D deficiency, unspecified: Secondary | ICD-10-CM | POA: Diagnosis not present

## 2019-07-21 DIAGNOSIS — Z923 Personal history of irradiation: Secondary | ICD-10-CM | POA: Diagnosis not present

## 2019-07-21 DIAGNOSIS — Z79899 Other long term (current) drug therapy: Secondary | ICD-10-CM | POA: Diagnosis not present

## 2019-07-21 DIAGNOSIS — Z510 Encounter for antineoplastic radiation therapy: Secondary | ICD-10-CM | POA: Diagnosis not present

## 2019-07-21 MED ORDER — PALONOSETRON HCL INJECTION 0.25 MG/5ML
INTRAVENOUS | Status: AC
  Filled 2019-07-21: qty 5

## 2019-07-21 MED ORDER — HEPARIN SOD (PORK) LOCK FLUSH 100 UNIT/ML IV SOLN
500.0000 [IU] | Freq: Once | INTRAVENOUS | Status: AC | PRN
  Administered 2019-07-21: 500 [IU]
  Filled 2019-07-21: qty 5

## 2019-07-21 MED ORDER — SODIUM CHLORIDE 0.9 % IV SOLN
150.0000 mg | Freq: Once | INTRAVENOUS | Status: AC
  Administered 2019-07-21: 150 mg via INTRAVENOUS
  Filled 2019-07-21: qty 150

## 2019-07-21 MED ORDER — SODIUM CHLORIDE 0.9 % IV SOLN
Freq: Once | INTRAVENOUS | Status: AC
  Filled 2019-07-21: qty 250

## 2019-07-21 MED ORDER — SODIUM CHLORIDE 0.9 % IV SOLN
38.5000 mg/m2 | Freq: Once | INTRAVENOUS | Status: AC
  Administered 2019-07-21: 100 mg via INTRAVENOUS
  Filled 2019-07-21: qty 100

## 2019-07-21 MED ORDER — POTASSIUM CHLORIDE 2 MEQ/ML IV SOLN
Freq: Once | INTRAVENOUS | Status: AC
  Filled 2019-07-21: qty 10

## 2019-07-21 MED ORDER — DEXAMETHASONE SODIUM PHOSPHATE 10 MG/ML IJ SOLN
10.0000 mg | Freq: Once | INTRAMUSCULAR | Status: AC
  Administered 2019-07-21: 10 mg via INTRAVENOUS

## 2019-07-21 MED ORDER — DEXAMETHASONE SODIUM PHOSPHATE 10 MG/ML IJ SOLN
INTRAMUSCULAR | Status: AC
  Filled 2019-07-21: qty 1

## 2019-07-21 MED ORDER — PALONOSETRON HCL INJECTION 0.25 MG/5ML
0.2500 mg | Freq: Once | INTRAVENOUS | Status: AC
  Administered 2019-07-21: 0.25 mg via INTRAVENOUS

## 2019-07-21 MED ORDER — SODIUM CHLORIDE 0.9% FLUSH
10.0000 mL | INTRAVENOUS | Status: DC | PRN
  Administered 2019-07-21: 10 mL
  Filled 2019-07-21: qty 10

## 2019-07-21 NOTE — Patient Instructions (Signed)
[facility] Discharge Instructions for Patients Receiving Chemotherapy  Today you received the following chemotherapy agents: Cisplatin (Platinol)  To help prevent nausea and vomiting after your treatment, we encourage you to take your nausea medication as directed.   If you develop nausea and vomiting that is not controlled by your nausea medication, call the clinic.   BELOW ARE SYMPTOMS THAT SHOULD BE REPORTED IMMEDIATELY:  *FEVER GREATER THAN 100.5 F  *CHILLS WITH OR WITHOUT FEVER  NAUSEA AND VOMITING THAT IS NOT CONTROLLED WITH YOUR NAUSEA MEDICATION  *UNUSUAL SHORTNESS OF BREATH  *UNUSUAL BRUISING OR BLEEDING  TENDERNESS IN MOUTH AND THROAT WITH OR WITHOUT PRESENCE OF ULCERS  *URINARY PROBLEMS  *BOWEL PROBLEMS  UNUSUAL RASH Items with * indicate a potential emergency and should be followed up as soon as possible.  Feel free to call the clinic should you have any questions or concerns. The clinic phone number is [phone].  Please show the [facility] at check-in to the Emergency Department and triage nurse.  Coronavirus (COVID-19) Are you at risk?  Are you at risk for the Coronavirus (COVID-19)?  To be considered HIGH RISK for Coronavirus (COVID-19), you have to meet the following criteria:  . Traveled to [place], [place], [place], [place] or [place]; or in the [place] to [place], [place], [place], or [place]; and have fever, cough, and shortness of breath within the last 2 weeks of travel OR . Been in close contact with a person diagnosed with COVID-19 within the last 2 weeks and have fever, cough, and shortness of breath . IF YOU DO NOT MEET THESE CRITERIA, YOU ARE CONSIDERED LOW RISK FOR COVID-19.  What to do if you are HIGH RISK for COVID-19?  Marland Kitchen If you are having a medical emergency, call 911. . Seek medical care right away. Before you go to a doctor's office, urgent care or emergency department, call ahead and tell them  about your recent travel, contact with someone diagnosed with COVID-19, and your symptoms. You should receive instructions from your physician's office regarding next steps of care.  . When you arrive at healthcare provider, tell the healthcare staff immediately you have returned from visiting [place], [place], [place], [place] or [place]; or traveled in the [place] to [place], [place], [place], or [place]; in the last two weeks or you have been in close contact with a person diagnosed with COVID-19 in the last 2 weeks.   . Tell the health care staff about your symptoms: fever, cough and shortness of breath. . After you have been seen by a medical provider, you will be either: o Tested for (COVID-19) and discharged home on quarantine except to seek medical care if symptoms worsen, and asked to  - Stay home and avoid contact with others until you get your results (4-5 days)  - Avoid travel on public transportation if possible (such as bus, train, or airplane) or o Sent to the Emergency Department by EMS for evaluation, COVID-19 testing, and possible admission depending on your condition and test results.  What to do if you are LOW RISK for COVID-19?  Reduce your risk of any infection by using the same precautions used for avoiding the common cold or flu:  Marland Kitchen Wash your hands often with soap and warm water for at least 20 seconds.  If soap and water are not readily available, use an alcohol-based hand sanitizer with at least 60% alcohol.  . If coughing or  sneezing, cover your mouth and nose by coughing or sneezing into the elbow areas of your shirt or coat, into a tissue or into your sleeve (not your hands). . Avoid shaking hands with others and consider head nods or verbal greetings only. . Avoid touching your eyes, nose, or mouth with unwashed hands.  . Avoid close contact with people who are sick. . Avoid places or events with large numbers of people in one location, like concerts or  sporting events. . Carefully consider travel plans you have or are making. . If you are planning any travel outside or inside the [place], visit the CDC's Travelers' Health webpage for the latest health notices. . If you have some symptoms but not all symptoms, continue to monitor at home and seek medical attention if your symptoms worsen. . If you are having a medical emergency, call 911.   [place] / e-Visit: [email or web address]         [facility]: [facility]: [phone]                   [facility]: [phone]

## 2019-07-24 ENCOUNTER — Other Ambulatory Visit: Payer: Self-pay

## 2019-07-24 ENCOUNTER — Ambulatory Visit
Admission: RE | Admit: 2019-07-24 | Discharge: 2019-07-24 | Disposition: A | Discharge status: Home / Self Care | Payer: BC Managed Care – PPO | Source: Ambulatory Visit | Attending: Radiation Oncology | Admitting: Radiation Oncology

## 2019-07-24 ENCOUNTER — Ambulatory Visit: Payer: BC Managed Care – PPO

## 2019-07-24 DIAGNOSIS — Z510 Encounter for antineoplastic radiation therapy: Secondary | ICD-10-CM | POA: Diagnosis not present

## 2019-07-24 DIAGNOSIS — C090 Malignant neoplasm of tonsillar fossa: Secondary | ICD-10-CM | POA: Diagnosis not present

## 2019-07-25 ENCOUNTER — Ambulatory Visit
Admission: RE | Admit: 2019-07-25 | Discharge: 2019-07-25 | Disposition: A | Discharge status: Home / Self Care | Payer: BC Managed Care – PPO | Source: Ambulatory Visit | Attending: Radiation Oncology | Admitting: Radiation Oncology

## 2019-07-25 ENCOUNTER — Encounter: Payer: Self-pay | Admitting: Radiation Oncology

## 2019-07-25 ENCOUNTER — Encounter: Payer: Self-pay | Admitting: *Deleted

## 2019-07-25 ENCOUNTER — Other Ambulatory Visit: Payer: Self-pay

## 2019-07-25 DIAGNOSIS — Z510 Encounter for antineoplastic radiation therapy: Secondary | ICD-10-CM | POA: Diagnosis not present

## 2019-07-25 DIAGNOSIS — C090 Malignant neoplasm of tonsillar fossa: Secondary | ICD-10-CM | POA: Diagnosis not present

## 2019-07-25 NOTE — Progress Notes (Signed)
Oncology Nurse Navigator Documentation  Met with Mr. Eric Weaver prior to final RT to offer support and to celebrate end of radiation treatment.   Provided verbal/written post-RT guidance:  Importance of keeping all follow-up appts, especially those with Nutrition and SLP.  He indicated he was never seen by SLP.  Importance of protecting treatment area from sun.  Continuation of Sonafine application 2-3 times daily, application of abx ointment to areas of raw skin; when supply of Sonafine exhausted transition to OTC lotion with vitamin E. Explained my role as navigator will continue for several more months, encouraged him to call me with needs/concerns moving forward.   He agreed to call me with needs/concerns.    Gayleen Orem, RN, BSN Head & Neck Oncology [place] at [place] [phone]

## 2019-07-26 ENCOUNTER — Encounter: Payer: Self-pay | Admitting: Hematology and Oncology

## 2019-07-26 ENCOUNTER — Inpatient Hospital Stay: Payer: BC Managed Care – PPO

## 2019-07-26 NOTE — Progress Notes (Signed)
Nutrition Follow-up:   Patient with carcinoma of tonsillar fossa.  Patient has completed chemotherapy (2/19) and radiation 2/23.    Spoke with patient for nutrition follow-up.  Patient reports that he had a rough day yesterday and today.  Reports increased pain on swallowing and thick mucus.  Reports tried to eat [place] food last night and did not work well. Reports vomiting.   Patient reports took 1 carton of osmolite 1.5 last night and 1 carton this am.  Has not eaten anything so far today.  Denies difficulty with giving tube feeding or tolerating nutrition via feeding tube.  Patient reports sore tongue and hard to talk.      Medications: reviewed  Labs: no new  Anthropometrics:   Weight 278 lb 8 oz in [facility] on 2/22 287 lb on 2/15 per [facility] 289 lb 4 oz on 2/8 in [facility]ria 1/25 291 lb in [facility]  4% weight loss in the last month, concerning.    Estimated Energy Needs  Kcals: [id number] Protein: 121-162 g Fluid: > 2.9 L  NUTRITION DIAGNOSIS: Predicted suboptimal energy intake continues   INTERVENTION:  Recommend patient trying [employer] [place] Peptide 1.5 (sample formula) higher calories and protein than osmolite 1.5.  Goal rate will be 6 cartons per day to meet nutritional needs.  Instructed patient to give 1 carton QID (8am, noon, 4pm, 8pm). Flush with 93ml before and after feeding.  Patient will add 1/2 carton daily to regimen until 6 cartons reached per day.  Patient will need to drink or give via feeding tube additional 240 ml water TID.  Written instructions along with samples of formula, syringes, tape, gauze left with Dr. Libby Maw nurse to give to patient at 2/25 appointment.    Tube feeding will provide 3000 calories, 144 g protein, 2562 ml free water.  Patient has contact information    MONITORING, EVALUATION, GOAL: Patient will consume adequate calories and protein to maintain muscle mass during treatment.     NEXT VISIT: phone f/u by Friday, Feb 26  Mindy Gali B. Zenia Resides, [place],  [place] Registered Dietitian [phone] (pager)

## 2019-07-27 ENCOUNTER — Inpatient Hospital Stay: Payer: BC Managed Care – PPO

## 2019-07-27 ENCOUNTER — Encounter: Payer: Self-pay | Admitting: Hematology and Oncology

## 2019-07-27 ENCOUNTER — Other Ambulatory Visit: Payer: Self-pay

## 2019-07-27 ENCOUNTER — Inpatient Hospital Stay (HOSPITAL_BASED_OUTPATIENT_CLINIC_OR_DEPARTMENT_OTHER): Payer: BC Managed Care – PPO | Admitting: Hematology and Oncology

## 2019-07-27 VITALS — BP 122/82 | HR 83 | Temp 97.4°F | Resp 18 | Ht 72.0 in | Wt 276.0 lb

## 2019-07-27 DIAGNOSIS — Z5111 Encounter for antineoplastic chemotherapy: Secondary | ICD-10-CM | POA: Diagnosis not present

## 2019-07-27 DIAGNOSIS — C090 Malignant neoplasm of tonsillar fossa: Secondary | ICD-10-CM | POA: Diagnosis not present

## 2019-07-27 DIAGNOSIS — Z923 Personal history of irradiation: Secondary | ICD-10-CM | POA: Diagnosis not present

## 2019-07-27 DIAGNOSIS — Z87442 Personal history of urinary calculi: Secondary | ICD-10-CM | POA: Diagnosis not present

## 2019-07-27 DIAGNOSIS — I10 Essential (primary) hypertension: Secondary | ICD-10-CM | POA: Diagnosis not present

## 2019-07-27 DIAGNOSIS — Z79899 Other long term (current) drug therapy: Secondary | ICD-10-CM | POA: Diagnosis not present

## 2019-07-27 DIAGNOSIS — R110 Nausea: Secondary | ICD-10-CM | POA: Diagnosis not present

## 2019-07-27 DIAGNOSIS — E559 Vitamin D deficiency, unspecified: Secondary | ICD-10-CM | POA: Diagnosis not present

## 2019-07-27 DIAGNOSIS — F329 Major depressive disorder, single episode, unspecified: Secondary | ICD-10-CM | POA: Diagnosis not present

## 2019-07-27 DIAGNOSIS — Z931 Gastrostomy status: Secondary | ICD-10-CM | POA: Diagnosis not present

## 2019-07-27 DIAGNOSIS — K219 Gastro-esophageal reflux disease without esophagitis: Secondary | ICD-10-CM | POA: Diagnosis not present

## 2019-07-27 LAB — CBC WITH DIFFERENTIAL (CANCER CENTER ONLY)
Abs Immature Granulocytes: 0.01 10*3/uL (ref 0.00–0.07)
Basophils Absolute: 0 10*3/uL (ref 0.0–0.1)
Basophils Relative: 1 %
Eosinophils Absolute: 0 10*3/uL (ref 0.0–0.5)
Eosinophils Relative: 0 %
HCT: 35.9 % — ABNORMAL LOW (ref 39.0–52.0)
Hemoglobin: 12.1 g/dL — ABNORMAL LOW (ref 13.0–17.0)
Immature Granulocytes: 0 %
Lymphocytes Relative: 13 %
Lymphs Abs: 0.3 10*3/uL — ABNORMAL LOW (ref 0.7–4.0)
MCH: 27.5 pg (ref 26.0–34.0)
MCHC: 33.7 g/dL (ref 30.0–36.0)
MCV: 81.6 fL (ref 80.0–100.0)
Monocytes Absolute: 0.4 10*3/uL (ref 0.1–1.0)
Monocytes Relative: 16 %
Neutro Abs: 1.6 10*3/uL — ABNORMAL LOW (ref 1.7–7.7)
Neutrophils Relative %: 70 %
Platelet Count: 243 10*3/uL (ref 150–400)
RBC: 4.4 MIL/uL (ref 4.22–5.81)
RDW: 16.5 % — ABNORMAL HIGH (ref 11.5–15.5)
WBC Count: 2.3 10*3/uL — ABNORMAL LOW (ref 4.0–10.5)
nRBC: 0 % (ref 0.0–0.2)

## 2019-07-27 LAB — CMP (CANCER CENTER ONLY)
ALT: 48 U/L — ABNORMAL HIGH (ref 0–44)
AST: 20 U/L (ref 15–41)
Albumin: 3.9 g/dL (ref 3.5–5.0)
Alkaline Phosphatase: 78 U/L (ref 38–126)
Anion gap: 10 (ref 5–15)
BUN: 15 mg/dL (ref 6–20)
CO2: 28 mmol/L (ref 22–32)
Calcium: 9.6 mg/dL (ref 8.9–10.3)
Chloride: 97 mmol/L — ABNORMAL LOW (ref 98–111)
Creatinine: 0.93 mg/dL (ref 0.61–1.24)
GFR, Est AFR Am: >60 mL/min (ref >60)
GFR, Estimated: >60 mL/min (ref >60)
Glucose, Bld: 112 mg/dL — ABNORMAL HIGH (ref 70–99)
Potassium: 4.8 mmol/L (ref 3.5–5.1)
Sodium: 135 mmol/L (ref 135–145)
Total Bilirubin: 0.6 mg/dL (ref 0.3–1.2)
Total Protein: 8.1 g/dL (ref 6.5–8.1)

## 2019-07-27 LAB — MAGNESIUM: Magnesium: 1.6 mg/dL — ABNORMAL LOW (ref 1.7–2.4)

## 2019-07-27 MED ORDER — SENNOSIDES-DOCUSATE SODIUM 8.6-50 MG PO TABS: 1.0000 | ORAL_TABLET | Freq: Two times a day (BID) | ORAL | 3 refills | Status: DC | PRN

## 2019-07-27 MED ORDER — OXYCODONE HCL 5 MG PO TABS: 5.0000 mg | ORAL_TABLET | ORAL | 0 refills | Status: DC | PRN

## 2019-07-27 NOTE — Progress Notes (Signed)
[facility] [phone]   [phone]  PROGRESS NOTE  Patient Care Team: [name], DO as PCP - General (Family Medicine) Izora Gala, MD as Consulting Physician (Otolaryngology) Eppie Gibson, MD as Attending Physician (Radiation Oncology) Leota Sauers, RN as Registered Nurse Orson Slick, MD as Consulting Physician (Hematology and Oncology) Sharen Counter, CCC-SLP as Speech Language Pathologist (Speech Pathology) Karie Mainland, RD as Dietitian (Nutrition) Wynelle Beckmann, Melodie Bouillon, PT as Physical Therapist (Physical Therapy) Kennith Center, LCSW as Social Worker  Hematological/Oncological History # 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 CT scan performed showing 4.1 x 4.0 x 5.3 cm mass centered at the right palatine tonsil, highly concerning for primary head and neck malignancy, likely squamous cell carcinoma, additionally there were partially necrotic right level II and III adenopathy. 2) 04/26/2019: referred to ENT, underwent right tonsillar biopsy confirming invasive squamous cell cancer 3) 05/17/2019: PET CT scan showed hypermetabolic mass at right palantine tonsil and hypermetabolic right level 2 and 3 lymph nodes. There was also mildly hypermetabolic lymph node in the left level 2 neck.  4) 05/24/2019: establish care with Dr. Lorenso Courier  5) 06/07/2019: started radiation therapy with Dr. Isidore Moos 6) 06/16/2019: Started Cisplatin 40mg /m2 with concurrent radiation 7) 06/23/2019: Week 2 cisplatin 40mg /m2 with concurrent radiation 9) 06/30/2019: Week 3 cisplatin 40mg /m2 with concurrent radiation 10) 07/07/2019: Week 4 cisplatin 40mg /m2 with concurrent radiation 11) 07/14/2019: Week 5 cisplatin 40mg /m2 with concurrent radiation 12) 07/21/2019: Week 6 cisplatin 40mg /m2 with concurrent radiation 13) 07/25/2019: Completion of Chemoradiation.   Interval  History:  Eric Weaver 49 y.o. male with medical history significant for carcinoma of the tonsillar fossa  presents for a follow up visit. The patient's last visit was on 06/29/2019 prior to the start of week 3 of chemotherapy. In the interim since the last visit he has received 4 weekly doses of cisplatin with an additional 2 weeks planned.   On exam today Eric Weaver notes he is having much worsening throat pain.Unfortunately he has been having worsening throat pain since the completion of his therapy.  He notes that on Tuesday his wife got [place] food and he was able to eat this successfully but subsequently had a lot of mucousy discharge and pain on swallowing.  The pain became a 10 out of 10 in severity and he notes that even 2 of the oxycodone was not able to touch the pain.  He unfortunately over the last 24 hours has received most of his nutrition via his G-tube.  He notes that when he is not talking or not swallowing his pain is only a 3 out of 10.  He notes that he is having trouble sleeping with this level of pain.  He has also been taking some dosing of ibuprofen which has not been as helpful.  He also notes that he is having difficulty burping however he has been able to vent gas through the G-tube.  The mucus he has been producing and spitting out has been mostly clear and this was witnessed during his clinic visit today.  He notes that he is still taking his pills orally for pain.  He does have Viscous Lidocaine as prescribed by Dr. Isidore Moos however this has not provided him much relief and he notes with the secretions that he is having in his mouth viscous lidocaine is an unpleasant sensation.  He does report  that his bowels have not moved since the day before yesterday, and that he is not taking his senna docusate.  He has had some occasional feelings of chills and being cold, but otherwise has denied fevers, sweats, nausea, vomiting, or abdominal pain.  A full 10 point ROS is listed  below.  MEDICAL HISTORY:  Past Medical History:  Diagnosis Date  . Depression   . Essential hypertension 08/02/2018  . GERD (gastroesophageal reflux disease)   . History of nephrolithiasis   . Vitamin D deficiency     SURGICAL HISTORY: Past Surgical History:  Procedure Laterality Date  . Biopsy of right tonsil Right 04/24/2019   Positive for squamous cell carcinoma  . IR GASTROSTOMY TUBE MOD SED  06/06/2019  . IR IMAGING GUIDED PORT INSERTION  06/06/2019    ALLERGIES:  has No Known Allergies.  MEDICATIONS:  Current Outpatient Medications  Medication Sig Dispense Refill  . guaiFENesin (ROBITUSSIN) 100 MG/5ML liquid Take 200 mg by mouth 3 (three) times daily as needed for cough.    Marland Kitchen ibuprofen (ADVIL) 200 MG tablet Take 200 mg by mouth every 6 (six) hours as needed. Takes 3-4    . escitalopram (LEXAPRO) 20 MG tablet Take 1 tablet (20 mg total) by mouth daily. (Needs to be seen before next refill) 30 tablet 0  . esomeprazole (NEXIUM) 20 MG capsule Take 1 capsule (20 mg total) by mouth daily at 12 noon. 90 capsule 3  . ipratropium (ATROVENT) 0.06 % nasal spray Place 2 sprays into both nostrils 4 (four) times daily. (Patient not taking: Reported on 05/17/2019) 15 mL 0  . lidocaine (XYLOCAINE) 2 % solution Patient: Mix 1part 2% viscous lidocaine, 1part H20. Swish & swallow 28mL of diluted mixture, 69min before meals and at bedtime, up to QID 200 mL 4  . lidocaine-prilocaine (EMLA) cream Apply 1 application topically as needed. 30 g 2  . ondansetron (ZOFRAN) 8 MG tablet Take 1 tablet (8 mg total) by mouth every 8 (eight) hours as needed for nausea or vomiting. 30 tablet 1  . oxyCODONE (OXY IR/ROXICODONE) 5 MG immediate release tablet Take 1 tablet (5 mg total) by mouth every 4 (four) hours as needed for severe pain. 60 tablet 0  . prochlorperazine (COMPAZINE) 10 MG tablet Take 1 tablet (10 mg total) by mouth every 6 (six) hours as needed for nausea or vomiting. 30 tablet 1  . senna-docusate  (SENOKOT-S) 8.6-50 MG tablet Take 1 tablet by mouth 2 (two) times daily as needed for mild constipation. 30 tablet 3  . sodium fluoride (PREVIDENT 5000 PLUS) 1.1 % CREA dental cream Apply to tooth brush. Brush teeth for 2 minutes. Spit out excess. So NOT swallow. Repeat nightly. 1 Tube prn  . sucralfate (CARAFATE) 1 g tablet Take 1 tablet (1 g total) by mouth 4 (four) times daily -  with meals and at bedtime. 120 tablet 1  . tadalafil (CIALIS) 5 MG tablet Take 5 mg by mouth daily as needed.    . Vitamin D, Ergocalciferol, (DRISDOL) 1.25 MG (50000 UT) CAPS capsule TAKE 1 CAPSULE BY MOUTH WEEKLY 12 capsule 1   No current facility-administered medications for this visit.    REVIEW OF SYSTEMS:   Constitutional: ( - ) fevers, ( - )  chills , ( - ) night sweats Eyes: ( - ) blurriness of vision, ( - ) double vision, ( - ) watery eyes Ears, nose, mouth, throat, and face: ( - ) mucositis, ( + ) sore throat Respiratory: ( - )  cough, ( - ) dyspnea, ( - ) wheezes Cardiovascular: ( - ) palpitation, ( - ) chest discomfort, ( - ) lower extremity swelling Gastrointestinal:  ( - ) nausea, ( - ) heartburn, ( - ) change in bowel habits Skin: ( - ) abnormal skin rashes Lymphatics: ( - ) new lymphadenopathy, ( - ) easy bruising Neurological: ( - ) numbness, ( - ) tingling, ( - ) new weaknesses Behavioral/Psych: ( - ) mood change, ( - ) new changes  All other systems were reviewed with the patient and are negative.  PHYSICAL EXAMINATION: ECOG PERFORMANCE STATUS: 1 - Symptomatic but completely ambulatory  Vitals:   07/27/19 1107  BP: 122/82  Pulse: 83  Resp: 18  Temp: (!) 97.4 F (36.3 C)  SpO2: 97%   Filed Weights   07/27/19 1107  Weight: 276 lb (125.2 kg)    GENERAL: well appearing obese Caucasian male in NAD  SKIN: skin color, texture, turgor are normal, no rashes or significant lesions EYES: conjunctiva are pink and non-injected, sclera clear LUNGS: clear to auscultation and percussion with  normal breathing effort HEART: regular rate & rhythm and no murmurs and no lower extremity edema ABDOMEN: soft, non-tender, non-distended, normal bowel sounds. G tube in place with some surrounding erythema. No frank discharge Musculoskeletal: no cyanosis of digits and no clubbing  PSYCH: alert & oriented x 3, fluent speech NEURO: no focal motor/sensory deficits  LABORATORY DATA:  I have reviewed the data as listed CBC Latest Ref Rng & Units 07/27/2019 07/20/2019 07/13/2019  WBC 4.0 - 10.5 K/uL 2.3(L) 2.6(L) 5.5  Hemoglobin 13.0 - 17.0 g/dL 12.1(L) 12.6(L) 12.9(L)  Hematocrit 39.0 - 52.0 % 35.9(L) 38.2(L) 38.5(L)  Platelets 150 - 400 K/uL 243 180 150    CMP Latest Ref Rng & Units 07/27/2019 07/20/2019 07/13/2019  Glucose 70 - 99 mg/dL 112(H) 108(H) 95  BUN 6 - 20 mg/dL 15 15 15   Creatinine 0.61 - 1.24 mg/dL 0.93 0.97 0.85  Sodium 135 - 145 mmol/L 135 136 137  Potassium 3.5 - 5.1 mmol/L 4.8 4.7 4.3  Chloride 98 - 111 mmol/L 97(L) 102 103  CO2 22 - 32 mmol/L 28 26 26   Calcium 8.9 - 10.3 mg/dL 9.6 9.4 9.1  Total Protein 6.5 - 8.1 g/dL 8.1 8.0 7.7  Total Bilirubin 0.3 - 1.2 mg/dL 0.6 0.4 0.4  Alkaline Phos 38 - 126 U/L 78 72 73  AST 15 - 41 U/L 20 27 27   ALT 0 - 44 U/L 48(H) 43 46(H)    RADIOGRAPHIC STUDIES: No results found.  ASSESSMENT & PLAN Eric Weaver 49 y.o. male with medical history significant for carcinoma of the tonsillar fossa  presents for a follow up visit.  He has successfully completed his chemoradiation therapy with the last dose of radiation administered on 07/25/2019.    On exam today the largest concern was the increasing throat pain.  Of last 24 hours he has been taking in all his nutrition via the G-tube.  He notes that the oxycodone dose he is currently taking is not effectively helping with the pain and he has not been sleeping well.  Today we readjusted his pain medications in order to appropriately treat his level of pain.  In the event that the short acting  medications we are providing are not adequate we can consider the addition of fentanyl patches versus MS Contin in order to provide long-acting pain relief.  We have requested that he call our clinic on Monday if  his pain is not under better control by that point in time so that we can adjust his pain medications.  In terms of his cancer we are planning to repeat PET CT scan in approximately 12 to 16 weeks which would be of May 2021.  In the interim we will focus on improving his nutritional status and improving the throat pain that he has been having.  We will have him return to clinic in approximately 2 weeks time in order to further evaluate.  # Carcinoma of the Tonsillar Fossa (Stage II, T3N2M0 p16+) --completed Chemoradiation therapy with cisplatin 40mg /m2 on 07/25/2019. Received 6 doses of cisplatin during the course of radiation.  --Hgb and Cr are stable on therapy. Mild drop in WBC on last CBC --we will reassess with PET CT in 12-16 weeks to assure response to therapy (approximately May 2021)  --Plan for RTC on 08/10/2019 with labs   #Throat Pain, worsening --increase to oxycodone 10-15mg  q4 H PRN. Refill medication today. Provide prescription for PRN senna-docusate to avoid constipation. --can add 1000mg  tylenol of 800mg  of advil with the oxycodone for improved pain control  --provide with sucralfate prescription to help ease throat pain (recommend it be grounded up). --encourage continued PO intake if able, patient has been using the G tube for the last 24 hours due to worsening throat pain.   #Nutritional Status, stable --in the last 24 hours he has not been able to tolerate PO, has been using the G tube. --provided with tube feeding  --continues to flush PEG tube as recommended.  --has currently lost 14 lb since 06/29/2019 prior to treatment --minimal nausea reported, compazine PRN  No orders of the defined types were placed in this encounter.  All questions were answered. The  patient knows to call the clinic with any problems, questions or concerns.  A total of more than 30 minutes were spent on this encounter and over half of that time was spent on counseling and coordination of care as outlined above.   Ledell Peoples, MD Department of Hematology/Oncology [place] at [facility] Phone: [phone] Pager: [phone] Email: [email or web address]  07/27/2019 5:00 PM

## 2019-07-28 ENCOUNTER — Telehealth: Payer: Self-pay | Admitting: Hematology and Oncology

## 2019-07-28 ENCOUNTER — Telehealth (HOSPITAL_COMMUNITY): Payer: Self-pay

## 2019-07-28 NOTE — Telephone Encounter (Signed)
Nutrition  Called patient for nutrition follow-up.  No answer.  Left message on voicemail with call back number encouraging patient to return call.   Shantika Bermea B. Zenia Resides, [place], [place] Registered Dietitian [phone] (pager)

## 2019-07-28 NOTE — Telephone Encounter (Signed)
Scheduled per los. Called and left msg. Mailed printout  °

## 2019-08-01 ENCOUNTER — Inpatient Hospital Stay: Payer: BC Managed Care – PPO | Attending: Hematology and Oncology

## 2019-08-01 DIAGNOSIS — K219 Gastro-esophageal reflux disease without esophagitis: Secondary | ICD-10-CM | POA: Insufficient documentation

## 2019-08-01 DIAGNOSIS — I10 Essential (primary) hypertension: Secondary | ICD-10-CM | POA: Insufficient documentation

## 2019-08-01 DIAGNOSIS — E559 Vitamin D deficiency, unspecified: Secondary | ICD-10-CM | POA: Insufficient documentation

## 2019-08-01 DIAGNOSIS — R070 Pain in throat: Secondary | ICD-10-CM | POA: Insufficient documentation

## 2019-08-01 DIAGNOSIS — C090 Malignant neoplasm of tonsillar fossa: Secondary | ICD-10-CM | POA: Insufficient documentation

## 2019-08-01 DIAGNOSIS — Z23 Encounter for immunization: Secondary | ICD-10-CM | POA: Insufficient documentation

## 2019-08-01 DIAGNOSIS — F329 Major depressive disorder, single episode, unspecified: Secondary | ICD-10-CM | POA: Insufficient documentation

## 2019-08-01 DIAGNOSIS — Z9221 Personal history of antineoplastic chemotherapy: Secondary | ICD-10-CM | POA: Insufficient documentation

## 2019-08-01 DIAGNOSIS — R110 Nausea: Secondary | ICD-10-CM | POA: Insufficient documentation

## 2019-08-01 DIAGNOSIS — Z79899 Other long term (current) drug therapy: Secondary | ICD-10-CM | POA: Insufficient documentation

## 2019-08-01 DIAGNOSIS — Z87442 Personal history of urinary calculi: Secondary | ICD-10-CM | POA: Insufficient documentation

## 2019-08-01 DIAGNOSIS — Z923 Personal history of irradiation: Secondary | ICD-10-CM | POA: Insufficient documentation

## 2019-08-01 NOTE — Progress Notes (Signed)
Nutrition Follow-up:  Patient with carcinoma of tonsillar fossa.  Patient has completed chemotherapy (2/19) and radiation ( 2/23).    Spoke with patient this am via phone.  Reports that he is using the feeding tube and giving 3-4 cartons of [employer] 1.5 formula.  Reports heartburn not relieved with nexium.  Patient is flushing tube with water before and after feeding and giving medications via feeding tube with water.  Reports that he is having bowel movement "loose" not diarrhea daily or every other day.    Reports that only able to drink water and chocolate milk orally (8oz).      Medications: nexium  Labs: reviewed  Anthropometrics:   Weight 276 lb on 2/25 from 278 lb  4% weight loss in 2 weeks   Estimated Energy Needs  Kcals: 2900-3300 Protein: 121-162 g Fluid: > 2.9 L  NUTRITION DIAGNOSIS: Predicted suboptimal energy intake continues   INTERVENTION:  Sent message to Dr. Libby Maw RN regarding patient compliant regarding heartburn unrelieved with nexium.  Reviewed with patient proper techniques for tube feeding to decrease risk of heartburn.  Encouraged patient to consistently give 4 cartons of [employer] daily and add 1/2 carton until 6 cartons reached to better meet nutritional needs as patient not able to take in much orally.  Patient hoping by the end of the week will be able to take in more orally.   Encouraged patient to take in (via tube or orally) 2-3, 16.9 oz bottles of water daily via tube or orally or both to stay better hydrated.      MONITORING, EVALUATION, GOAL: Patient will consume adequate calories and protein to maintain muscle mass during treatment   NEXT VISIT: March, 10th phone f/u  Malakie Balis B. Zenia Resides, [place], [place] Registered Dietitian [phone] (pager)

## 2019-08-02 ENCOUNTER — Other Ambulatory Visit: Payer: Self-pay | Admitting: Hematology and Oncology

## 2019-08-02 ENCOUNTER — Telehealth: Payer: Self-pay | Admitting: *Deleted

## 2019-08-02 ENCOUNTER — Encounter: Payer: Self-pay | Admitting: Hematology and Oncology

## 2019-08-02 MED ORDER — FENTANYL 25 MCG/HR TD PT72: 1.0000 | MEDICATED_PATCH | TRANSDERMAL | 0 refills | Status: DC

## 2019-08-02 MED ORDER — OXYCODONE HCL 5 MG PO TABS: 5.0000 mg | ORAL_TABLET | ORAL | 0 refills | Status: DC | PRN

## 2019-08-02 NOTE — Telephone Encounter (Signed)
Received call from patient requesting a refill of his Oxycodone. Last filled on 07/27/19 for # 60. He is taking 1-2 tabs every 4 hours.  Thank you.

## 2019-08-03 ENCOUNTER — Other Ambulatory Visit: Payer: Self-pay | Admitting: Hematology and Oncology

## 2019-08-03 MED ORDER — FENTANYL 12 MCG/HR TD PT72: 1.0000 | MEDICATED_PATCH | TRANSDERMAL | 0 refills | Status: DC

## 2019-08-09 ENCOUNTER — Inpatient Hospital Stay: Payer: BC Managed Care – PPO

## 2019-08-09 NOTE — Progress Notes (Signed)
Nutrition  Called patient for nutrition follow-up 2 different times this pm.  No answer.  Left message with call back number.    Keymon Mcelroy B. Zenia Resides, [place], [place] Registered Dietitian [phone] (pager)

## 2019-08-10 ENCOUNTER — Telehealth: Payer: Self-pay | Admitting: *Deleted

## 2019-08-10 ENCOUNTER — Inpatient Hospital Stay (HOSPITAL_BASED_OUTPATIENT_CLINIC_OR_DEPARTMENT_OTHER): Payer: BC Managed Care – PPO | Admitting: Hematology and Oncology

## 2019-08-10 ENCOUNTER — Inpatient Hospital Stay: Payer: BC Managed Care – PPO

## 2019-08-10 ENCOUNTER — Other Ambulatory Visit: Payer: Self-pay

## 2019-08-10 ENCOUNTER — Encounter: Payer: Self-pay | Admitting: Hematology and Oncology

## 2019-08-10 ENCOUNTER — Ambulatory Visit: Payer: BC Managed Care – PPO | Admitting: Nutrition

## 2019-08-10 VITALS — BP 110/73 | HR 87 | Temp 97.8°F | Resp 20 | Ht 72.0 in | Wt 266.1 lb

## 2019-08-10 DIAGNOSIS — Z923 Personal history of irradiation: Secondary | ICD-10-CM | POA: Diagnosis not present

## 2019-08-10 DIAGNOSIS — F329 Major depressive disorder, single episode, unspecified: Secondary | ICD-10-CM | POA: Diagnosis not present

## 2019-08-10 DIAGNOSIS — R110 Nausea: Secondary | ICD-10-CM | POA: Diagnosis not present

## 2019-08-10 DIAGNOSIS — Z87442 Personal history of urinary calculi: Secondary | ICD-10-CM | POA: Diagnosis not present

## 2019-08-10 DIAGNOSIS — R070 Pain in throat: Secondary | ICD-10-CM | POA: Diagnosis not present

## 2019-08-10 DIAGNOSIS — Z23 Encounter for immunization: Secondary | ICD-10-CM | POA: Diagnosis not present

## 2019-08-10 DIAGNOSIS — C090 Malignant neoplasm of tonsillar fossa: Secondary | ICD-10-CM

## 2019-08-10 DIAGNOSIS — Z9221 Personal history of antineoplastic chemotherapy: Secondary | ICD-10-CM | POA: Diagnosis not present

## 2019-08-10 DIAGNOSIS — K219 Gastro-esophageal reflux disease without esophagitis: Secondary | ICD-10-CM | POA: Diagnosis not present

## 2019-08-10 DIAGNOSIS — Z79899 Other long term (current) drug therapy: Secondary | ICD-10-CM | POA: Diagnosis not present

## 2019-08-10 DIAGNOSIS — E559 Vitamin D deficiency, unspecified: Secondary | ICD-10-CM | POA: Diagnosis not present

## 2019-08-10 DIAGNOSIS — I10 Essential (primary) hypertension: Secondary | ICD-10-CM | POA: Diagnosis not present

## 2019-08-10 LAB — CBC WITH DIFFERENTIAL (CANCER CENTER ONLY)
Abs Immature Granulocytes: 0.05 10*3/uL (ref 0.00–0.07)
Basophils Absolute: 0 10*3/uL (ref 0.0–0.1)
Basophils Relative: 1 %
Eosinophils Absolute: 0 10*3/uL (ref 0.0–0.5)
Eosinophils Relative: 0 %
HCT: 37.2 % — ABNORMAL LOW (ref 39.0–52.0)
Hemoglobin: 12.4 g/dL — ABNORMAL LOW (ref 13.0–17.0)
Immature Granulocytes: 1 %
Lymphocytes Relative: 12 %
Lymphs Abs: 0.5 10*3/uL — ABNORMAL LOW (ref 0.7–4.0)
MCH: 28.6 pg (ref 26.0–34.0)
MCHC: 33.3 g/dL (ref 30.0–36.0)
MCV: 85.9 fL (ref 80.0–100.0)
Monocytes Absolute: 0.5 10*3/uL (ref 0.1–1.0)
Monocytes Relative: 10 %
Neutro Abs: 3.4 10*3/uL (ref 1.7–7.7)
Neutrophils Relative %: 76 %
Platelet Count: 251 10*3/uL (ref 150–400)
RBC: 4.33 MIL/uL (ref 4.22–5.81)
RDW: 19.9 % — ABNORMAL HIGH (ref 11.5–15.5)
WBC Count: 4.5 10*3/uL (ref 4.0–10.5)
nRBC: 0 % (ref 0.0–0.2)

## 2019-08-10 LAB — CMP (CANCER CENTER ONLY)
ALT: 43 U/L (ref 0–44)
AST: 30 U/L (ref 15–41)
Albumin: 3.9 g/dL (ref 3.5–5.0)
Alkaline Phosphatase: 81 U/L (ref 38–126)
Anion gap: 11 (ref 5–15)
BUN: 19 mg/dL (ref 6–20)
CO2: 27 mmol/L (ref 22–32)
Calcium: 9.7 mg/dL (ref 8.9–10.3)
Chloride: 104 mmol/L (ref 98–111)
Creatinine: 1.09 mg/dL (ref 0.61–1.24)
GFR, Est AFR Am: >60 mL/min (ref >60)
GFR, Estimated: >60 mL/min (ref >60)
Glucose, Bld: 99 mg/dL (ref 70–99)
Potassium: 4.8 mmol/L (ref 3.5–5.1)
Sodium: 142 mmol/L (ref 135–145)
Total Bilirubin: 0.4 mg/dL (ref 0.3–1.2)
Total Protein: 8.1 g/dL (ref 6.5–8.1)

## 2019-08-10 LAB — MAGNESIUM: Magnesium: 1.9 mg/dL (ref 1.7–2.4)

## 2019-08-10 MED ORDER — INFLUENZA VAC SPLIT QUAD 0.5 ML IM SUSY
0.5000 mL | PREFILLED_SYRINGE | Freq: Once | INTRAMUSCULAR | Status: AC
  Administered 2019-08-10: 0.5 mL via INTRAMUSCULAR

## 2019-08-10 MED ORDER — INFLUENZA VAC SPLIT QUAD 0.5 ML IM SUSY
PREFILLED_SYRINGE | INTRAMUSCULAR | Status: AC
  Filled 2019-08-10: qty 0.5

## 2019-08-10 NOTE — Progress Notes (Signed)
[place] [phone]   [phone]  PROGRESS NOTE  Patient Care Team: [name], DO as PCP - General (Family Medicine) Izora Gala, MD as Consulting Physician (Otolaryngology) Eppie Gibson, MD as Attending Physician (Radiation Oncology) Leota Sauers, RN as Registered Nurse Orson Slick, MD as Consulting Physician (Hematology and Oncology) Sharen Counter, CCC-SLP as Speech Language Pathologist (Speech Pathology) Karie Mainland, RD as Dietitian (Nutrition) Wynelle Beckmann, Melodie Bouillon, PT as Physical Therapist (Physical Therapy) Kennith Center, LCSW as Social Worker  Hematological/Oncological History # 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 CT scan performed showing 4.1 x 4.0 x 5.3 cm mass centered at the right palatine tonsil, highly concerning for primary head and neck malignancy, likely squamous cell carcinoma, additionally there were partially necrotic right level II and III adenopathy. 2) 04/26/2019: referred to ENT, underwent right tonsillar biopsy confirming invasive squamous cell cancer 3) 05/17/2019: PET CT scan showed hypermetabolic mass at right palantine tonsil and hypermetabolic right level 2 and 3 lymph nodes. There was also mildly hypermetabolic lymph node in the left level 2 neck.  4) 05/24/2019: establish care with Dr. Lorenso Weaver  5) 06/07/2019: started radiation therapy with Dr. Isidore Moos 6) 06/16/2019: Started Cisplatin 40mg /m2 with concurrent radiation 7) 06/23/2019: Week 2 cisplatin 40mg /m2 with concurrent radiation 9) 06/30/2019: Week 3 cisplatin 40mg /m2 with concurrent radiation 10) 07/07/2019: Week 4 cisplatin 40mg /m2 with concurrent radiation 11) 07/14/2019: Week 5 cisplatin 40mg /m2 with concurrent radiation 12) 07/21/2019: Week 6 cisplatin 40mg /m2 with concurrent radiation 13) 07/25/2019: Completion of Chemoradiation.   Interval  History:  Eric Weaver 49 y.o. male with medical history significant for carcinoma of the tonsillar fossa  presents for a follow up visit. The patient's last visit was on 07/27/2019, after the completion of chemoradiation.  On exam today Eric Weaver notes he is improved from last week.  He reports that after our visit on 07/27/2019 he had worsening pain the following week.  This week he notes on Monday he had entirely tube feeds, but on Tuesday he was able to tolerate an omelette.  He notes that the pain is 6 or 7 out of 10 in severity when he tries to swallow without water.  With water it is about a 3 out of 10 in severity.  He is still producing mucus and occasionally has to cough it up, but it is markedly reduced from prior.  He does still have some coughing spells at night.  In terms of his pain medication he is currently using the fentanyl patch and very rarely using the oxycodone medication.  He notes that his bowels are still moving regularly and last moved yesterday.  He is however taking ibuprofen 600 mg every 4-6 hours in order to help with the pain.  He otherwise notes improvement in his energy and overall improvement in his spirits.  He does note that he wishes to return back to work once he is no longer requiring tube feedings.  He notes that this is because there is no good place for him to use the tube at work.  Otherwise he denies having any fevers, sweats, nausea, vomiting, or diarrhea.  A full 10 point ROS is listed below.  MEDICAL HISTORY:  Past Medical History:  Diagnosis Date   Depression    Essential hypertension 08/02/2018   GERD (gastroesophageal reflux disease)    History of nephrolithiasis  Vitamin D deficiency     SURGICAL HISTORY: Past Surgical History:  Procedure Laterality Date   Biopsy of right tonsil Right 04/24/2019   Positive for squamous cell carcinoma   IR GASTROSTOMY TUBE MOD SED  06/06/2019   IR IMAGING GUIDED PORT INSERTION  06/06/2019    ALLERGIES:   has No Known Allergies.  MEDICATIONS:  Current Outpatient Medications  Medication Sig Dispense Refill   escitalopram (LEXAPRO) 20 MG tablet Take 1 tablet (20 mg total) by mouth daily. (Needs to be seen before next refill) 30 tablet 0   esomeprazole (NEXIUM) 20 MG capsule Take 1 capsule (20 mg total) by mouth daily at 12 noon. 90 capsule 3   fentaNYL (DURAGESIC) 12 MCG/HR Place 1 patch onto the skin every 3 (three) days. 10 patch 0   guaiFENesin (ROBITUSSIN) 100 MG/5ML liquid Take 200 mg by mouth 3 (three) times daily as needed for cough.     ibuprofen (ADVIL) 200 MG tablet Take 200 mg by mouth every 6 (six) hours as needed. Takes 3-4     ipratropium (ATROVENT) 0.06 % nasal spray Place 2 sprays into both nostrils 4 (four) times daily. (Patient not taking: Reported on 05/17/2019) 15 mL 0   lidocaine (XYLOCAINE) 2 % solution Patient: Mix 1part 2% viscous lidocaine, 1part H20. Swish & swallow 27mL of diluted mixture, 22min before meals and at bedtime, up to QID 200 mL 4   lidocaine-prilocaine (EMLA) cream Apply 1 application topically as needed. 30 g 2   ondansetron (ZOFRAN) 8 MG tablet Take 1 tablet (8 mg total) by mouth every 8 (eight) hours as needed for nausea or vomiting. 30 tablet 1   oxyCODONE (OXY IR/ROXICODONE) 5 MG immediate release tablet Take 1 tablet (5 mg total) by mouth every 4 (four) hours as needed for severe pain. 60 tablet 0   prochlorperazine (COMPAZINE) 10 MG tablet Take 1 tablet (10 mg total) by mouth every 6 (six) hours as needed for nausea or vomiting. 30 tablet 1   senna-docusate (SENOKOT-S) 8.6-50 MG tablet Take 1 tablet by mouth 2 (two) times daily as needed for mild constipation. 30 tablet 3   sodium fluoride (PREVIDENT 5000 PLUS) 1.1 % CREA dental cream Apply to tooth brush. Brush teeth for 2 minutes. Spit out excess. So NOT swallow. Repeat nightly. 1 Tube prn   sucralfate (CARAFATE) 1 g tablet Take 1 tablet (1 g total) by mouth 4 (four) times daily -  with  meals and at bedtime. 120 tablet 1   tadalafil (CIALIS) 5 MG tablet Take 5 mg by mouth daily as needed.     Vitamin D, Ergocalciferol, (DRISDOL) 1.25 MG (50000 UT) CAPS capsule TAKE 1 CAPSULE BY MOUTH WEEKLY 12 capsule 1   No current facility-administered medications for this visit.    REVIEW OF SYSTEMS:   Constitutional: ( - ) fevers, ( - )  chills , ( - ) night sweats Eyes: ( - ) blurriness of vision, ( - ) double vision, ( - ) watery eyes Ears, nose, mouth, throat, and face: ( + ) mucositis, ( + ) sore throat Respiratory: ( + ) cough, ( - ) dyspnea, ( - ) wheezes Cardiovascular: ( - ) palpitation, ( - ) chest discomfort, ( - ) lower extremity swelling Gastrointestinal:  ( - ) nausea, ( - ) heartburn, ( - ) change in bowel habits Skin: ( - ) abnormal skin rashes Lymphatics: ( - ) new lymphadenopathy, ( - ) easy bruising Neurological: ( - ) numbness, ( - )  tingling, ( - ) new weaknesses Behavioral/Psych: ( - ) mood change, ( - ) new changes  All other systems were reviewed with the patient and are negative.  PHYSICAL EXAMINATION: ECOG PERFORMANCE STATUS: 1 - Symptomatic but completely ambulatory  Vitals:   08/10/19 0946  BP: 110/73  Pulse: 87  Resp: 20  Temp: 97.8 F (36.6 C)  SpO2: 98%   Filed Weights   08/10/19 0946  Weight: 266 lb 1.6 oz (120.7 kg)    GENERAL: well appearing obese Caucasian male in NAD  SKIN: skin color, texture, turgor are normal, no rashes or significant lesions EYES: conjunctiva are pink and non-injected, sclera clear LUNGS: clear to auscultation and percussion with normal breathing effort HEART: regular rate & rhythm and no murmurs and no lower extremity edema ABDOMEN: soft, non-tender, non-distended, normal bowel sounds. G tube in place with some surrounding erythema. No frank discharge Musculoskeletal: no cyanosis of digits and no clubbing  PSYCH: alert & oriented x 3, fluent speech NEURO: no focal motor/sensory deficits  LABORATORY DATA:    I have reviewed the data as listed CBC Latest Ref Rng & Units 08/10/2019 07/27/2019 07/20/2019  WBC 4.0 - 10.5 K/uL 4.5 2.3(L) 2.6(L)  Hemoglobin 13.0 - 17.0 g/dL 12.4(L) 12.1(L) 12.6(L)  Hematocrit 39.0 - 52.0 % 37.2(L) 35.9(L) 38.2(L)  Platelets 150 - 400 K/uL 251 243 180    CMP Latest Ref Rng & Units 08/10/2019 07/27/2019 07/20/2019  Glucose 70 - 99 mg/dL 99 112(H) 108(H)  BUN 6 - 20 mg/dL 19 15 15   Creatinine 0.61 - 1.24 mg/dL 1.09 0.93 0.97  Sodium 135 - 145 mmol/L 142 135 136  Potassium 3.5 - 5.1 mmol/L 4.8 4.8 4.7  Chloride 98 - 111 mmol/L 104 97(L) 102  CO2 22 - 32 mmol/L 27 28 26   Calcium 8.9 - 10.3 mg/dL 9.7 9.6 9.4  Total Protein 6.5 - 8.1 g/dL 8.1 8.1 8.0  Total Bilirubin 0.3 - 1.2 mg/dL 0.4 0.6 0.4  Alkaline Phos 38 - 126 U/L 81 78 72  AST 15 - 41 U/L 30 20 27   ALT 0 - 44 U/L 43 48(H) 43    RADIOGRAPHIC STUDIES: No results found.  ASSESSMENT & PLAN Eric Weaver 49 y.o. male with medical history significant for carcinoma of the tonsillar fossa  presents for a follow up visit.  He has successfully completed his chemoradiation therapy with the last dose of radiation administered on 07/25/2019.    On exam today Eric Weaver is improved from prior.  Pain is under better control and he is able to take in more p.o.  He continues to have throat pain, but overall this is improving.  In terms of his cancer we are planning to repeat PET CT scan in approximately 12 to 16 weeks which would be of May 2021.  In the interim we will focus on improving his nutritional status and improving the throat pain that he has been having.  We will have him return to clinic in approximately 4 weeks time in order to further evaluate.  # Carcinoma of the Tonsillar Fossa (Stage II, T3N2M0 p16+) --completed Chemoradiation therapy with cisplatin 40mg /m2 on 07/25/2019. Received 6 doses of cisplatin during the course of radiation.  --Hgb and Cr are stable on therapy. Stable WBC/Hgb on last CBC --we will  reassess with PET CT in 12-16 weeks to assure response to therapy (approximately May 2021)  --Plan for RTC in 4 weeks (April 2021) with labs   #Throat Pain, improving --continue oxycodone  10-15mg  q4 H PRN.  Provided prescription for PRN senna-docusate to avoid constipation. --continue fentanyl patches 12.5mg  q72H  --continue 600mg  of advil q6H with the oxycodone for improved pain control  --provided with sucralfate prescription to help ease throat pain (recommend it be grounded up). --encourage continued PO intake when able  #Nutritional Status, stable --over the last 2 weeks he has been using the G tube, some PO intake --provided with tube feeding supplies today  --continues to flush PEG tube as recommended.  --has currently lost 24 lb since 06/29/2019 (290 lbs) prior to treatment --minimal nausea reported, compazine PRN  Orders Placed This Encounter  Procedures   NM PET Image Restag (PS) Skull Base To Thigh    Standing Status:   Future    Standing Expiration Date:   08/09/2020    Order Specific Question:   If indicated for the ordered procedure, I authorize the administration of a radiopharmaceutical per Radiology protocol    Answer:   Yes    Order Specific Question:   Preferred imaging location?    Answer:   [facility]    Order Specific Question:   Radiology Contrast Protocol - do NOT remove file path    Answer:   \[email or web address]   All questions were answered. The patient knows to call the clinic with any problems, questions or concerns.  A total of more than 30 minutes were spent on this encounter and over half of that time was spent on counseling and coordination of care as outlined above.   Ledell Peoples, MD Department of Hematology/Oncology [place] at [facility] Phone: [phone] Pager: [phone] Email: [email or web address]  08/10/2019 2:39 PM

## 2019-08-10 NOTE — Progress Notes (Signed)
I called the patient today about their upcoming follow-up appointment in radiation oncology.   Given the state of the  COVID-19 pandemic, concerning case numbers in our community, and guidance from [facility], I offered a phone assessment with the patient to determine if coming to the clinic was necessary. The patient accepted.  I let the patient know that I had spoken with Dr. Isidore Moos, and she wanted them to know the importance of washing their hands for at least 20 seconds at a time, especially after going out in public, and before they eat. Limit going out in public whenever possible. Do not touch your face, unless your hands are clean, such as when bathing. Get plenty of rest, eat well, and stay hydrated.   Symptomatically, the patient is doing relatively well. They report improvement in eating/drinking in the last few days. He feels like he is "turning the corner"  All questions were answered to the patient's satisfaction.  I encouraged the patient to call with any further questions. Otherwise, the plan is to see Dr. Isidore Moos in 2 1/2 months for results of imaging.    Patient is pleased with this plan, and we will cancel their upcoming follow-up to reduce the risk of COVID-19 transmission.

## 2019-08-10 NOTE — Telephone Encounter (Signed)
CALLED PATIENT TO INFORM OF NEW FU APPT. ON 10-27-19 @ 2:20 PM, LVM FOR A RETURN CALL

## 2019-08-10 NOTE — Progress Notes (Signed)
I was contacted by nursing that patient is requesting additional [employer] 1.5 tube feeding formula. I followed up with patient in the exam room. Patient reports that he has completed all his treatment. He continues to have heartburn especially after tube feeding. He denies nausea and vomiting. Reports he is tolerating some eggs, soft chicken, and milkshakes. Weight is decreased and was documented as 266.1 pounds today which is a decrease of 10 pounds over 1 week. Patient acknowledges weight loss is not ideal.    Estimated nutrition needs: 2900-3300 cal, 121-162 g protein, 2.9 L fluid.  Nutrition diagnosis: Predicted suboptimal energy intake has evolved into inadequate oral intake.  Intervention: Educated patient again on the importance of administering tube feeding slowly, at room temperature, and staying upright after feeding.  Also encourage patient to stand and slowly walk around after giving tube feeding. Discouraged additional air through feeding tube. Continue 4 cartons [employer] via G-tube daily as long as oral intake is improving. Continue 2-3, 16.9 ounces bottle of water daily via tube or by mouth. Provided additional samples of [employer] 1.5 formula.  Monitoring, evaluation, goals: Patient will tolerate increased calories and protein to minimize further weight loss.  Next visit: Wednesday, March 17 by phone.  **Disclaimer: This note was dictated with voice recognition software. Similar sounding words can inadvertently be transcribed and this note may contain transcription errors which may not have been corrected upon publication of note.**

## 2019-08-11 ENCOUNTER — Telehealth: Payer: Self-pay | Admitting: Hematology and Oncology

## 2019-08-11 ENCOUNTER — Other Ambulatory Visit: Payer: Self-pay | Admitting: Radiation Oncology

## 2019-08-11 ENCOUNTER — Ambulatory Visit
Admission: RE | Admit: 2019-08-11 | Discharge: 2019-08-11 | Disposition: A | Discharge status: Home / Self Care | Payer: BC Managed Care – PPO | Source: Ambulatory Visit | Attending: Radiation Oncology | Admitting: Radiation Oncology

## 2019-08-11 NOTE — Telephone Encounter (Signed)
Scheduled per 3/11 los. Called and spoke with patient Confirmed appt

## 2019-08-15 NOTE — Progress Notes (Signed)
  Patient Name: Eric Weaver MRN: [id number] DOB: 1970-06-17 Referring Physician: Izora Gala (Profile Not Attached) Date of Service: 07/25/2019 [facility], [place]                                                        End Of Treatment Note  Diagnoses: C09.0-Malignant neoplasm of tonsillar fossa  Cancer Staging: Cancer Staging Carcinoma of tonsillar fossa ([place]) Staging form: Pharynx - HPV-Mediated Oropharynx, AJCC 8th Edition - Clinical stage from 05/12/2019: Stage II (cT3, cN2, cM0, p16+) - Signed by Eppie Gibson, MD on 05/19/2019  Intent: Curative  Radiation Treatment Dates: 06/07/2019 through 07/25/2019  Site Technique Total Dose (Gy) Dose per Fx (Gy) Completed Fx Beam Energies  Tonsil, Right: HN_Rt_tonsil IMRT 70/70 2 35/35 6X   Narrative: The patient tolerated radiation therapy relatively well.   Plan: The patient will follow-up with radiation oncology in 2 weeks. -----------------------------------  Eppie Gibson, MD

## 2019-08-16 ENCOUNTER — Inpatient Hospital Stay: Payer: BC Managed Care – PPO

## 2019-08-16 NOTE — Progress Notes (Signed)
Nutrition Follow-up:  Patient with carcinoma of tonsillar fossa.  Patient has completed chemotherapy (2/19) and radiation (2/23).  Spoke with patient via phone for nutrition follow-up.  Patient reports that he is no longer using tube for nutrition (stopped on 3/12 or 3/13).  Reports yesterday was able to eat breakfast burrito, bologna sandwich, then [place] and bologna sandwich, grilled chicken salad and drank protein shake.  Reports heartburn is better after stopping tube feeding.  Reports pain on swallowing but bareable. Reports taste are coming back.  Hoping to be able to go back to work on Monday 3/22.    Medications: reviewed  Labs: reviewed  Anthropometrics:   No new weight   NUTRITION DIAGNOSIS:  Predicted sub optimal energy intake improving  INTERVENTION:  Encouraged patient to continue to focus on good nutrition orally for faster recovery and get him ready to start back work on 3/22.  Recommend patient to maintain nutrition orally for at least 4 weeks without using tube before tube removal.   Patient has contact information    MONITORING, EVALUATION, GOAL: Patient will tolerate oral nutrition and maintain weight   NEXT VISIT: phone Wednesday, March 31  Gerhard Rappaport B. Zenia Resides, [place], [place] Registered Dietitian [phone] (pager)

## 2019-08-18 ENCOUNTER — Telehealth (HOSPITAL_COMMUNITY): Payer: Self-pay

## 2019-08-18 NOTE — Telephone Encounter (Signed)
I called to schedule patient for S/P XRT appt. Left message for patient to return call to Dental Medicine to schedule appt.

## 2019-08-21 ENCOUNTER — Encounter: Payer: Self-pay | Admitting: *Deleted

## 2019-08-21 ENCOUNTER — Telehealth: Payer: Self-pay | Admitting: *Deleted

## 2019-08-21 NOTE — Telephone Encounter (Signed)
Received call from patient . He is requesting a return to work letter as he is starting back to work today.  Letter typed and then signed per Dr. Lorenso Courier. Letter fax'd to [email or web address]

## 2019-08-30 ENCOUNTER — Inpatient Hospital Stay: Payer: BC Managed Care – PPO

## 2019-08-30 NOTE — Progress Notes (Signed)
Nutrition Follow-up:  Patient with carcinoma of tonsillar fossa.  Patient has completed chemotherapy (2/19) and radiation (2/23).    Spoke with patient via phone for nutrition follow-up.  Patient has not used PEG tube for nutrition in the last 2-3 weeks.  Reports that yesterday he was able to eat a breakfast bowl for breakfast, grilled chicken salad for lunch and grilled chicken with something else he can't remember for dinner last night.  Had some peanut during the day yesterday.  Has not drank any protein shakes in the last week.  Reports that throat is still sore and mouth is painful but improving.   Reports that heartburn is better. Bowels are moving.    Reports that he started back work on 3/22 and walking 3-4 miles per day.     Medications: reviewed  Labs: no new  Anthropometrics:   Patient thinks he may have lost another 1 -2 lbs  NUTRITION DIAGNOSIS: Predicted suboptimal energy intake improving   INTERVENTION:  Recommend patient add back protein shake. Patient to continue to focus on high calorie, high protein foods.   Patient will contact RD if needed in the future and has contact information   NEXT VISIT: no follow-up Patient to contact RD if needed in the future  Mellany Dinsmore B. Zenia Resides, [place], [place] Registered Dietitian [phone] (pager)

## 2019-09-14 ENCOUNTER — Inpatient Hospital Stay: Payer: BC Managed Care – PPO

## 2019-09-14 ENCOUNTER — Encounter: Payer: Self-pay | Admitting: Hematology and Oncology

## 2019-09-14 ENCOUNTER — Other Ambulatory Visit: Payer: Self-pay

## 2019-09-14 ENCOUNTER — Inpatient Hospital Stay: Payer: BC Managed Care – PPO | Attending: Hematology and Oncology | Admitting: Hematology and Oncology

## 2019-09-14 ENCOUNTER — Other Ambulatory Visit: Payer: Self-pay | Admitting: Hematology and Oncology

## 2019-09-14 VITALS — BP 115/76 | HR 72 | Temp 99.1°F | Resp 18 | Ht 72.0 in | Wt 268.2 lb

## 2019-09-14 DIAGNOSIS — E559 Vitamin D deficiency, unspecified: Secondary | ICD-10-CM | POA: Insufficient documentation

## 2019-09-14 DIAGNOSIS — Z79899 Other long term (current) drug therapy: Secondary | ICD-10-CM | POA: Insufficient documentation

## 2019-09-14 DIAGNOSIS — Z87442 Personal history of urinary calculi: Secondary | ICD-10-CM | POA: Insufficient documentation

## 2019-09-14 DIAGNOSIS — Z9221 Personal history of antineoplastic chemotherapy: Secondary | ICD-10-CM | POA: Diagnosis not present

## 2019-09-14 DIAGNOSIS — I10 Essential (primary) hypertension: Secondary | ICD-10-CM | POA: Diagnosis not present

## 2019-09-14 DIAGNOSIS — C090 Malignant neoplasm of tonsillar fossa: Secondary | ICD-10-CM

## 2019-09-14 DIAGNOSIS — F329 Major depressive disorder, single episode, unspecified: Secondary | ICD-10-CM | POA: Diagnosis not present

## 2019-09-14 DIAGNOSIS — K219 Gastro-esophageal reflux disease without esophagitis: Secondary | ICD-10-CM | POA: Diagnosis not present

## 2019-09-14 DIAGNOSIS — R070 Pain in throat: Secondary | ICD-10-CM | POA: Diagnosis not present

## 2019-09-14 DIAGNOSIS — Z923 Personal history of irradiation: Secondary | ICD-10-CM | POA: Diagnosis not present

## 2019-09-14 DIAGNOSIS — Z95828 Presence of other vascular implants and grafts: Secondary | ICD-10-CM

## 2019-09-14 LAB — CBC WITH DIFFERENTIAL (CANCER CENTER ONLY)
Abs Immature Granulocytes: 0.02 10*3/uL (ref 0.00–0.07)
Basophils Absolute: 0 10*3/uL (ref 0.0–0.1)
Basophils Relative: 1 %
Eosinophils Absolute: 0.1 10*3/uL (ref 0.0–0.5)
Eosinophils Relative: 2 %
HCT: 35.4 % — ABNORMAL LOW (ref 39.0–52.0)
Hemoglobin: 11.8 g/dL — ABNORMAL LOW (ref 13.0–17.0)
Immature Granulocytes: 0 %
Lymphocytes Relative: 12 %
Lymphs Abs: 0.7 10*3/uL (ref 0.7–4.0)
MCH: 29.6 pg (ref 26.0–34.0)
MCHC: 33.3 g/dL (ref 30.0–36.0)
MCV: 88.7 fL (ref 80.0–100.0)
Monocytes Absolute: 0.3 10*3/uL (ref 0.1–1.0)
Monocytes Relative: 6 %
Neutro Abs: 4.3 10*3/uL (ref 1.7–7.7)
Neutrophils Relative %: 79 %
Platelet Count: 200 10*3/uL (ref 150–400)
RBC: 3.99 MIL/uL — ABNORMAL LOW (ref 4.22–5.81)
RDW: 18.1 % — ABNORMAL HIGH (ref 11.5–15.5)
WBC Count: 5.5 10*3/uL (ref 4.0–10.5)
nRBC: 0 % (ref 0.0–0.2)

## 2019-09-14 LAB — CMP (CANCER CENTER ONLY)
ALT: 36 U/L (ref 0–44)
AST: 25 U/L (ref 15–41)
Albumin: 4.2 g/dL (ref 3.5–5.0)
Alkaline Phosphatase: 66 U/L (ref 38–126)
Anion gap: 7 (ref 5–15)
BUN: 13 mg/dL (ref 6–20)
CO2: 25 mmol/L (ref 22–32)
Calcium: 9.3 mg/dL (ref 8.9–10.3)
Chloride: 107 mmol/L (ref 98–111)
Creatinine: 0.86 mg/dL (ref 0.61–1.24)
GFR, Est AFR Am: >60 mL/min (ref >60)
GFR, Estimated: >60 mL/min (ref >60)
Glucose, Bld: 99 mg/dL (ref 70–99)
Potassium: 3.9 mmol/L (ref 3.5–5.1)
Sodium: 139 mmol/L (ref 135–145)
Total Bilirubin: 0.6 mg/dL (ref 0.3–1.2)
Total Protein: 7.5 g/dL (ref 6.5–8.1)

## 2019-09-14 LAB — MAGNESIUM: Magnesium: 1.8 mg/dL (ref 1.7–2.4)

## 2019-09-14 MED ORDER — HEPARIN SOD (PORK) LOCK FLUSH 100 UNIT/ML IV SOLN
500.0000 [IU] | Freq: Once | INTRAVENOUS | Status: AC
  Administered 2019-09-14: 500 [IU] via INTRAVENOUS
  Filled 2019-09-14: qty 5

## 2019-09-14 MED ORDER — SODIUM CHLORIDE 0.9% FLUSH
10.0000 mL | INTRAVENOUS | Status: DC | PRN
  Administered 2019-09-14: 10 mL via INTRAVENOUS
  Filled 2019-09-14: qty 10

## 2019-09-14 NOTE — Progress Notes (Signed)
[place] [phone]   [phone]  PROGRESS NOTE  Patient Care Team: [name], DO as PCP - General (Family Medicine) Izora Gala, MD as Consulting Physician (Otolaryngology) Eppie Gibson, MD as Attending Physician (Radiation Oncology) Leota Sauers, RN (Inactive) as Registered Nurse Orson Slick, MD as Consulting Physician (Hematology and Oncology) Sharen Counter, CCC-SLP as Speech Language Pathologist (Speech Pathology) Karie Mainland, RD as Dietitian (Nutrition) Wynelle Beckmann, Melodie Bouillon, PT as Physical Therapist (Physical Therapy) Kennith Center, LCSW as Social Worker  Hematological/Oncological History # 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 CT scan performed showing 4.1 x 4.0 x 5.3 cm mass centered at the right palatine tonsil, highly concerning for primary head and neck malignancy, likely squamous cell carcinoma, additionally there were partially necrotic right level II and III adenopathy. 2) 04/26/2019: referred to ENT, underwent right tonsillar biopsy confirming invasive squamous cell cancer 3) 05/17/2019: PET CT scan showed hypermetabolic mass at right palantine tonsil and hypermetabolic right level 2 and 3 lymph nodes. There was also mildly hypermetabolic lymph node in the left level 2 neck.  4) 05/24/2019: establish care with Dr. Lorenso Courier  5) 06/07/2019: started radiation therapy with Dr. Isidore Moos 6) 06/16/2019: Started Cisplatin 40mg /m2 with concurrent radiation 7) 06/23/2019: Week 2 cisplatin 40mg /m2 with concurrent radiation 9) 06/30/2019: Week 3 cisplatin 40mg /m2 with concurrent radiation 10) 07/07/2019: Week 4 cisplatin 40mg /m2 with concurrent radiation 11) 07/14/2019: Week 5 cisplatin 40mg /m2 with concurrent radiation 12) 07/21/2019: Week 6 cisplatin 40mg /m2 with concurrent radiation 13) 07/25/2019: Completion of Chemoradiation.    Interval History:  [name] 49 y.o. male with medical history significant for carcinoma of the tonsillar fossa  presents for a follow up visit. The patient's last visit was on 08/10/2019.  On exam today Mr. Grgurich notes he is markedly improved from his prior visit on 08/10/2019.  He reports that he has not used his G-tube in over 4 weeks since March 10.  He notes that his pain has been more tolerable and hangs around a baseline of 3-10 in severity mostly when swallowing.  He notes that he is completely off the oxycodone and is entirely stopped taking the fentanyl.  He reports that he does occasionally take ibuprofen for the pain in his mouth and takes about 3 x 200 mg ibuprofens a day when it is causing him pain.  His p.o. intake has been excellent and he is actually gained a few pounds since his last visit with [place].  He notes that he is eager to have his G-tube removed and has returned to work at his baseline level of activity.  He is not having any issues with fatigue.  He denies any fevers, chills, sweats, nausea, vomiting or diarrhea.  A full 10 point ROS is listed below.  MEDICAL HISTORY:  Past Medical History:  Diagnosis Date  . Depression   . Essential hypertension 08/02/2018  . GERD (gastroesophageal reflux disease)   . History of nephrolithiasis   . Vitamin D deficiency     SURGICAL HISTORY: Past Surgical History:  Procedure Laterality Date  . Biopsy of right tonsil Right 04/24/2019   Positive for squamous cell carcinoma  . IR GASTROSTOMY TUBE MOD SED  06/06/2019  . IR IMAGING GUIDED PORT INSERTION  06/06/2019    ALLERGIES:  has No Known Allergies.  MEDICATIONS:  Current Outpatient Medications  Medication Sig Dispense Refill  .  escitalopram (LEXAPRO) 20 MG tablet Take 1 tablet (20 mg total) by mouth daily. (Needs to be seen before next refill) 30 tablet 0  . esomeprazole (NEXIUM) 20 MG capsule Take 1 capsule (20 mg total) by mouth daily at 12 noon. 90 capsule 3  . guaiFENesin  (ROBITUSSIN) 100 MG/5ML liquid Take 200 mg by mouth 3 (three) times daily as needed for cough.    Marland Kitchen ibuprofen (ADVIL) 200 MG tablet Take 200 mg by mouth every 6 (six) hours as needed. Takes 3-4    . lidocaine (XYLOCAINE) 2 % solution Patient: Mix 1part 2% viscous lidocaine, 1part H20. Swish & swallow 13mL of diluted mixture, 43min before meals and at bedtime, up to QID (Patient not taking: Reported on 09/14/2019) 200 mL 4  . lidocaine-prilocaine (EMLA) cream Apply 1 application topically as needed. 30 g 2  . ondansetron (ZOFRAN) 8 MG tablet Take 1 tablet (8 mg total) by mouth every 8 (eight) hours as needed for nausea or vomiting. (Patient not taking: Reported on 09/14/2019) 30 tablet 1  . oxyCODONE (OXY IR/ROXICODONE) 5 MG immediate release tablet Take 1 tablet (5 mg total) by mouth every 4 (four) hours as needed for severe pain. 60 tablet 0  . prochlorperazine (COMPAZINE) 10 MG tablet Take 1 tablet (10 mg total) by mouth every 6 (six) hours as needed for nausea or vomiting. (Patient not taking: Reported on 09/14/2019) 30 tablet 1  . senna-docusate (SENOKOT-S) 8.6-50 MG tablet Take 1 tablet by mouth 2 (two) times daily as needed for mild constipation. (Patient not taking: Reported on 09/14/2019) 30 tablet 3  . sodium fluoride (PREVIDENT 5000 PLUS) 1.1 % CREA dental cream Apply to tooth brush. Brush teeth for 2 minutes. Spit out excess. So NOT swallow. Repeat nightly. (Patient not taking: Reported on 09/14/2019) 1 Tube prn  . sucralfate (CARAFATE) 1 g tablet Take 1 tablet (1 g total) by mouth 4 (four) times daily -  with meals and at bedtime. (Patient not taking: Reported on 09/14/2019) 120 tablet 1  . tadalafil (CIALIS) 5 MG tablet Take 5 mg by mouth daily as needed.    . Vitamin D, Ergocalciferol, (DRISDOL) 1.25 MG (50000 UT) CAPS capsule TAKE 1 CAPSULE BY MOUTH WEEKLY 12 capsule 1   No current facility-administered medications for this visit.    REVIEW OF SYSTEMS:   Constitutional: ( - ) fevers, ( - )   chills , ( - ) night sweats Eyes: ( - ) blurriness of vision, ( - ) double vision, ( - ) watery eyes Ears, nose, mouth, throat, and face: ( + ) mucositis, ( + ) sore throat Respiratory: ( + ) cough, ( - ) dyspnea, ( - ) wheezes Cardiovascular: ( - ) palpitation, ( - ) chest discomfort, ( - ) lower extremity swelling Gastrointestinal:  ( - ) nausea, ( - ) heartburn, ( - ) change in bowel habits Skin: ( - ) abnormal skin rashes Lymphatics: ( - ) new lymphadenopathy, ( - ) easy bruising Neurological: ( - ) numbness, ( - ) tingling, ( - ) new weaknesses Behavioral/Psych: ( - ) mood change, ( - ) new changes  All other systems were reviewed with the patient and are negative.  PHYSICAL EXAMINATION: ECOG PERFORMANCE STATUS: 1 - Symptomatic but completely ambulatory  Vitals:   09/14/19 1458  BP: 115/76  Pulse: 72  Resp: 18  Temp: 99.1 F (37.3 C)  SpO2: 100%   Filed Weights   09/14/19 1458  Weight: 268 lb 3.2  oz (121.7 kg)    GENERAL: well appearing obese Caucasian male in NAD  SKIN: skin color, texture, turgor are normal, no rashes or significant lesions EYES: conjunctiva are pink and non-injected, sclera clear LUNGS: clear to auscultation and percussion with normal breathing effort HEART: regular rate & rhythm and no murmurs and no lower extremity edema ABDOMEN: soft, non-tender, non-distended, normal bowel sounds. G tube in place with some surrounding erythema. No frank discharge Musculoskeletal: no cyanosis of digits and no clubbing  PSYCH: alert & oriented x 3, fluent speech NEURO: no focal motor/sensory deficits  LABORATORY DATA:  I have reviewed the data as listed CBC Latest Ref Rng & Units 09/14/2019 08/10/2019 07/27/2019  WBC 4.0 - 10.5 K/uL 5.5 4.5 2.3(L)  Hemoglobin 13.0 - 17.0 g/dL 11.8(L) 12.4(L) 12.1(L)  Hematocrit 39.0 - 52.0 % 35.4(L) 37.2(L) 35.9(L)  Platelets 150 - 400 K/uL 200 251 243    CMP Latest Ref Rng & Units 09/14/2019 08/10/2019 07/27/2019  Glucose 70 - 99  mg/dL 99 99 112(H)  BUN 6 - 20 mg/dL 13 19 15   Creatinine 0.61 - 1.24 mg/dL 0.86 1.09 0.93  Sodium 135 - 145 mmol/L 139 142 135  Potassium 3.5 - 5.1 mmol/L 3.9 4.8 4.8  Chloride 98 - 111 mmol/L 107 104 97(L)  CO2 22 - 32 mmol/L 25 27 28   Calcium 8.9 - 10.3 mg/dL 9.3 9.7 9.6  Total Protein 6.5 - 8.1 g/dL 7.5 8.1 8.1  Total Bilirubin 0.3 - 1.2 mg/dL 0.6 0.4 0.6  Alkaline Phos 38 - 126 U/L 66 81 78  AST 15 - 41 U/L 25 30 20   ALT 0 - 44 U/L 36 43 48(H)    RADIOGRAPHIC STUDIES: No results found.  ASSESSMENT & PLAN Eric Weaver 49 y.o. male with medical history significant for carcinoma of the tonsillar fossa  presents for a follow up visit.  He has successfully completed his chemoradiation therapy with the last dose of radiation administered on 07/25/2019.    On exam today Mr. Spare has nearly returned to baseline.  Pain is under control without opioid therapy.  He continues to have throat pain, but overall this is improving and controlled with PO ibuprofen.   In terms of his cancer we are planning to repeat PET CT scan in approximately 12 to 16 weeks which would be of May 2021.  He has an upcoming radiation oncology visit with Dr. Isidore Moos on 10/27/2019.  We will plan to have the PET CT scan ordered prior to that visit therefore can be reviewed with Dr. Isidore Moos.  If the PET scan shows no evidence of residual disease we can therefore enter surveillance with ENT performing routine fiberoptic examinations.  Oncology would take more of a peripheral role if he is found to be in a remission.  # Carcinoma of the Tonsillar Fossa (Stage II, T3N2M0 p16+) --completed Chemoradiation therapy with cisplatin 40mg /m2 on 07/25/2019. Received 6 doses of cisplatin during the course of radiation.  --Hgb and Cr are stable on therapy. Stable WBC/Hgb on last CBC --we will reassess with PET CT in 12-16 weeks to assure response to therapy (approximately May 2021)  --return visit with Dr. Isidore Moos on 10/27/2019.  --Plan  for RTC in 8 weeks (June 2021) with labs   #Throat Pain, improving --no longer requiring oxycodone or fentanyl.  --continue 600mg  of advil daily for pain control  --provided with sucralfate prescription to help ease throat pain (recommend it be grounded up). --continue to monitor   #Nutritional Status, improved --  over the last 4weeks he has not been using the G tube, all PO intake --will place referral to IR for PEG tube removal.  --continues to flush PEG tube as recommended.  --in total lost 24 lb since 06/29/2019 (290 lbs) prior to treatment, today had an increase in 2 lbs since his last visit in March   Orders Placed This Encounter  Procedures  . Ambulatory referral to Interventional Radiology    Referral Priority:   Routine    Referral Type:   Consultation    Referral Reason:   Specialty Services Required    Requested Specialty:   Interventional Radiology    Number of Visits Requested:   1   All questions were answered. The patient knows to call the clinic with any problems, questions or concerns.  A total of more than 20 minutes were spent on this encounter and over half of that time was spent on counseling and coordination of care as outlined above.   Ledell Peoples, MD Department of Hematology/Oncology [place] [facility] Phone: [phone] Pager: [phone] Email: [email or web address]  09/14/2019 4:30 PM

## 2019-09-28 ENCOUNTER — Other Ambulatory Visit: Payer: Self-pay | Admitting: Hematology and Oncology

## 2019-09-28 ENCOUNTER — Other Ambulatory Visit: Payer: Self-pay | Admitting: Emergency Medicine

## 2019-09-28 ENCOUNTER — Telehealth: Payer: Self-pay | Admitting: *Deleted

## 2019-09-28 DIAGNOSIS — C090 Malignant neoplasm of tonsillar fossa: Secondary | ICD-10-CM

## 2019-09-28 NOTE — Progress Notes (Signed)
Oncology Nurse Navigator Documentation  I received a phone call from [employer], [place] wife. She voiced that Mitsugi was feeling dizzy and at times felt like he would pass out. She also reported that he feels cold most of the time. She is worried about his blood counts. She also asked about having his PEG removed as was discussed in his last meeting with Dr. Lorenso Courier. I spoke with Eustaquio Maize, Dr. Libby Maw nurse and she plans to call the patient and arrange an appointment with symptom management for Legrand Como and discuss PEG removal with Dr. Lorenso Courier.   Harlow Asa RN, BSN, OCN Head & Neck Oncology Nurse [place] at [facility] Phone # [phone]  Fax # [phone]

## 2019-09-28 NOTE — Telephone Encounter (Signed)
Received call from Hubbard Robinson, RN navigator. Stating that pt's wife called about her husband. She states that he was feeling extra tired and occasional chills and wondered if he could be seen today.  Advised that we could see him in [facility] today.  TCT patient and spoke with him.  He states he had a few days of feeling extra tired by the end of the day. He is working. He states he had chills one day that lasted a couple of hours and resolved after eating warm food/fluids.   He states he feels ok right now. He also says he cannot come into as he does not have a ride. He will let me know if he thinks he needs to be seen tomorrow.  Spoke with Dr. Lorenso Courier and advised of the above. Also order placed for PEG tube removal at pt request.

## 2019-10-05 DIAGNOSIS — Z23 Encounter for immunization: Secondary | ICD-10-CM | POA: Diagnosis not present

## 2019-10-06 ENCOUNTER — Ambulatory Visit (HOSPITAL_COMMUNITY)
Admission: RE | Admit: 2019-10-06 | Discharge: 2019-10-06 | Disposition: A | Discharge status: Home / Self Care | Payer: BC Managed Care – PPO | Source: Ambulatory Visit | Attending: Hematology and Oncology | Admitting: Hematology and Oncology

## 2019-10-06 ENCOUNTER — Other Ambulatory Visit: Payer: Self-pay

## 2019-10-06 DIAGNOSIS — C090 Malignant neoplasm of tonsillar fossa: Secondary | ICD-10-CM | POA: Insufficient documentation

## 2019-10-06 DIAGNOSIS — Z431 Encounter for attention to gastrostomy: Secondary | ICD-10-CM | POA: Diagnosis not present

## 2019-10-06 HISTORY — PX: IR GASTROSTOMY TUBE REMOVAL: IMG5492

## 2019-10-06 MED ORDER — SILVER NITRATE-POT NITRATE 75-25 % EX MISC
CUTANEOUS | Status: AC
  Filled 2019-10-06: qty 10

## 2019-10-06 MED ORDER — LIDOCAINE VISCOUS HCL 2 % MT SOLN
OROMUCOSAL | Status: AC
  Administered 2019-10-06: 16:00:00 10 mL via OROMUCOSAL
  Filled 2019-10-06: qty 15

## 2019-10-06 MED ORDER — SILVER NITRATE-POT NITRATE 75-25 % EX MISC
2.0000 | Freq: Once | CUTANEOUS | Status: AC
  Administered 2019-10-06: 2 via TOPICAL

## 2019-10-06 MED ORDER — LIDOCAINE VISCOUS HCL 2 % MT SOLN: 15.0000 mL | Freq: Once | OROMUCOSAL | Status: AC

## 2019-10-06 NOTE — Procedures (Signed)
Per order of Dr. Lorenso Courier, patient's 5 french pull-through gastrostomy tube was removed in its entirety without immediate complications.  A small amount of silver nitrate was placed on insertion site granulation tissue.  Gauze dressing applied over site afterwards.  No immediate complications.  Medication used- viscous lidocaine into gastrostomy tube insertion site tract. EBL< 2 cc.

## 2019-10-16 ENCOUNTER — Encounter: Payer: Self-pay | Admitting: Hematology and Oncology

## 2019-10-16 ENCOUNTER — Other Ambulatory Visit: Payer: Self-pay

## 2019-10-16 ENCOUNTER — Ambulatory Visit (HOSPITAL_COMMUNITY)
Admission: RE | Admit: 2019-10-16 | Discharge: 2019-10-16 | Disposition: A | Discharge status: Home / Self Care | Payer: BC Managed Care – PPO | Source: Ambulatory Visit | Attending: Hematology and Oncology | Admitting: Hematology and Oncology

## 2019-10-16 DIAGNOSIS — C090 Malignant neoplasm of tonsillar fossa: Secondary | ICD-10-CM

## 2019-10-16 LAB — GLUCOSE, CAPILLARY: Glucose-Capillary: 104 mg/dL — ABNORMAL HIGH (ref 70–99)

## 2019-10-16 MED ORDER — FLUDEOXYGLUCOSE F - 18 (FDG) INJECTION
13.4300 | Freq: Once | INTRAVENOUS | Status: AC | PRN
  Administered 2019-10-16: 13.43 via INTRAVENOUS

## 2019-10-17 ENCOUNTER — Telehealth: Payer: Self-pay

## 2019-10-17 NOTE — Telephone Encounter (Signed)
TC to patient to let him know that Dr Lorenso Courier was out of the office today but I sent him an in basket message letting him know that patient would like to know his PET scan results. Let patient know that Dr Libby Maw nurse will be getting back to him tomorrow about scan results. Patient verbalized understanding.

## 2019-10-18 ENCOUNTER — Telehealth: Payer: Self-pay | Admitting: Hematology and Oncology

## 2019-10-18 ENCOUNTER — Telehealth: Payer: Self-pay | Admitting: *Deleted

## 2019-10-18 NOTE — Telephone Encounter (Signed)
TCT patient regarding results of recent PET scan. No answer but was able to leave vm message on identified vm, asking pt to call back @ [phone]

## 2019-10-18 NOTE — Telephone Encounter (Signed)
Received call back from patient.  Reviewed results of recent PET scan with him. Advised that the scan shows complete response to treatment.  He voiced understanding and relieved with these results. He is aware of his upcoming appts for lab and Dr. Lorenso Courier on 11/09/19 and Dr. Isidore Moos on 10/27/19

## 2019-10-18 NOTE — Telephone Encounter (Signed)
Scheduled per 5/19 sch message. Pt requested afternoon appts. Pt aware of appts on 6/10.

## 2019-10-24 ENCOUNTER — Encounter (HOSPITAL_COMMUNITY): Payer: Self-pay

## 2019-10-26 NOTE — Progress Notes (Signed)
Eric Weaver presents today for follow up after completing radiation to the right tonsil on 07/25/2019  Pain issues, if any: Reports an occasional pain in his right upper chest where his power port is located. Reports dependent upon what his eats his throat can hurt on occasion. Denies any pain today. Using a feeding tube?: Denies. Had feeding tube removed two weeks ago.  Weight changes, if any: Reports he has gained 6-8 lb recently Swallowing issues, if any: Denies. Smoking or chewing tobacco? No. Using fluoride trays daily? Denies using fluoride trays daily but confirms he still has them. Last ENT visit was on: December 2020. No future appt scheduled at this time with ENT. Other notable issues, if any:  PET scan on 10/16/2019 showed "1. Complete metabolic response to treatment. No residual mass, adenopathy or hypermetabolism is identified to suggest residual tumor. 2. No findings for metastatic disease." Follow-up with Dr. Narda Rutherford scheduled for June.  BP 121/79   Pulse 69   Temp (!) 97 F (36.1 C)   Resp 20   Ht 6' (1.829 m)   Wt 270 lb 12.8 oz (122.8 kg)   SpO2 99%   BMI 36.73 kg/m  Wt Readings from Last 3 Encounters:  10/27/19 270 lb 12.8 oz (122.8 kg)  09/14/19 268 lb 3.2 oz (121.7 kg)  08/10/19 266 lb 1.6 oz (120.7 kg)

## 2019-10-27 ENCOUNTER — Ambulatory Visit
Admission: RE | Admit: 2019-10-27 | Discharge: 2019-10-27 | Disposition: A | Discharge status: Home / Self Care | Payer: BC Managed Care – PPO | Source: Ambulatory Visit | Attending: Radiation Oncology | Admitting: Radiation Oncology

## 2019-10-27 ENCOUNTER — Encounter: Payer: Self-pay | Admitting: Radiation Oncology

## 2019-10-27 ENCOUNTER — Other Ambulatory Visit: Payer: Self-pay

## 2019-10-27 VITALS — BP 121/79 | HR 69 | Temp 97.0°F | Resp 20 | Ht 72.0 in | Wt 270.8 lb

## 2019-10-27 DIAGNOSIS — C090 Malignant neoplasm of tonsillar fossa: Secondary | ICD-10-CM

## 2019-10-27 DIAGNOSIS — Z923 Personal history of irradiation: Secondary | ICD-10-CM | POA: Diagnosis not present

## 2019-10-27 DIAGNOSIS — Z79899 Other long term (current) drug therapy: Secondary | ICD-10-CM | POA: Insufficient documentation

## 2019-10-27 DIAGNOSIS — Z08 Encounter for follow-up examination after completed treatment for malignant neoplasm: Secondary | ICD-10-CM | POA: Diagnosis not present

## 2019-10-27 DIAGNOSIS — Z85819 Personal history of malignant neoplasm of unspecified site of lip, oral cavity, and pharynx: Secondary | ICD-10-CM | POA: Diagnosis not present

## 2019-10-31 ENCOUNTER — Other Ambulatory Visit: Payer: Self-pay | Admitting: Radiation Oncology

## 2019-10-31 ENCOUNTER — Encounter: Payer: Self-pay | Admitting: Radiation Oncology

## 2019-10-31 DIAGNOSIS — C090 Malignant neoplasm of tonsillar fossa: Secondary | ICD-10-CM

## 2019-10-31 DIAGNOSIS — Z1329 Encounter for screening for other suspected endocrine disorder: Secondary | ICD-10-CM

## 2019-10-31 DIAGNOSIS — R5383 Other fatigue: Secondary | ICD-10-CM

## 2019-10-31 NOTE — Progress Notes (Signed)
Oncology Nurse Navigator Documentation  Per patient's 10/27/19 post-treatment follow-up with Dr. Isidore Moos, sent fax to [facility] ENT Scheduling with request Mr. Lampshire be contacted and scheduled for routine post-RT follow-up with Dr. Constance Holster.  Notification of successful fax transmission received.   Harlow Asa RN, BSN, OCN Head & Neck Oncology Nurse [place] at [facility] Phone # [phone]  Fax # [phone]

## 2019-10-31 NOTE — Progress Notes (Signed)
Radiation Oncology         [phone]) [phone] ________________________________  Name: Jean-Paul Kosh MRN: [id number]  Date: 10/27/2019  DOB: 17-Jul-1970  Follow-Up Visit Note outpatient, in person  CC: [name], DO  Izora Gala, MD  Diagnosis and Prior Radiotherapy:       ICD-10-CM   1. Carcinoma of tonsillar fossa ([place])  C09.0     CHIEF COMPLAINT:  Here for follow-up and surveillance of tonsil cancer  Narrative:     Mr. Muccio presents today for follow up after completing radiation to the right tonsil on 07/25/2019  Pain issues, if any: Reports an occasional pain in his right upper chest where his power port is located. Reports dependent upon what his eats his throat can hurt on occasion. Denies any pain today. Using a feeding tube?: Denies. Had feeding tube removed two weeks ago.  Weight changes, if any: Reports he has gained 6-8 lb recently Swallowing issues, if any: Denies. Smoking or chewing tobacco? No. Using fluoride trays daily? Denies using fluoride trays daily but confirms he still has them. Last ENT visit was on: December 2020. No future appt scheduled at this time with ENT. Other notable issues, if any:  PET scan on 10/16/2019 showed Complete metabolic response to treatment.       ALLERGIES:  has No Known Allergies.  Meds: Current Outpatient Medications  Medication Sig Dispense Refill  . escitalopram (LEXAPRO) 20 MG tablet Take 1 tablet (20 mg total) by mouth daily. (Needs to be seen before next refill) 30 tablet 0  . esomeprazole (NEXIUM) 20 MG capsule Take 1 capsule (20 mg total) by mouth daily at 12 noon. 90 capsule 3  . tadalafil (CIALIS) 5 MG tablet Take 5 mg by mouth daily as needed.    . benzonatate (TESSALON) 100 MG capsule benzonatate 100 mg capsule  TAKE 1 2 CAPSULES (100 200 MG TOTAL) BY MOUTH 3 (THREE) TIMES DAILY.    Marland Kitchen guaiFENesin (ROBITUSSIN) 100 MG/5ML liquid Take 200 mg by mouth 3 (three) times daily as needed for cough.    Marland Kitchen  HYDROcodone-acetaminophen (NORCO/VICODIN) 5-325 MG tablet hydrocodone 5 mg-acetaminophen 325 mg tablet  TAKE 1 TO 2 TABLETS BY MOUTH EVERY 6 HOURS AS NEEDED FOR UP TO 5 DAYS FOR MODERATE PAIN    . ibuprofen (ADVIL) 200 MG tablet Take 200 mg by mouth every 6 (six) hours as needed. Takes 3-4    . lidocaine (XYLOCAINE) 2 % solution Patient: Mix 1part 2% viscous lidocaine, 1part H20. Swish & swallow 40mL of diluted mixture, 39min before meals and at bedtime, up to QID (Patient not taking: Reported on 09/14/2019) 200 mL 4  . lidocaine-prilocaine (EMLA) cream Apply 1 application topically as needed. (Patient not taking: Reported on 10/27/2019) 30 g 2  . ondansetron (ZOFRAN) 8 MG tablet Take 1 tablet (8 mg total) by mouth every 8 (eight) hours as needed for nausea or vomiting. (Patient not taking: Reported on 09/14/2019) 30 tablet 1  . oxyCODONE (OXY IR/ROXICODONE) 5 MG immediate release tablet Take 1 tablet (5 mg total) by mouth every 4 (four) hours as needed for severe pain. (Patient not taking: Reported on 10/27/2019) 60 tablet 0  . prochlorperazine (COMPAZINE) 10 MG tablet Take 1 tablet (10 mg total) by mouth every 6 (six) hours as needed for nausea or vomiting. (Patient not taking: Reported on 09/14/2019) 30 tablet 1  . senna-docusate (SENOKOT-S) 8.6-50 MG tablet Take 1 tablet by mouth 2 (two) times daily as needed for mild constipation. (Patient  not taking: Reported on 09/14/2019) 30 tablet 3  . sodium fluoride (PREVIDENT 5000 PLUS) 1.1 % CREA dental cream Apply to tooth brush. Brush teeth for 2 minutes. Spit out excess. So NOT swallow. Repeat nightly. (Patient not taking: Reported on 09/14/2019) 1 Tube prn  . sucralfate (CARAFATE) 1 g tablet Take 1 tablet (1 g total) by mouth 4 (four) times daily -  with meals and at bedtime. (Patient not taking: Reported on 09/14/2019) 120 tablet 1  . Vitamin D, Ergocalciferol, (DRISDOL) 1.25 MG (50000 UT) CAPS capsule TAKE 1 CAPSULE BY MOUTH WEEKLY (Patient not taking: Reported  on 10/27/2019) 12 capsule 1   No current facility-administered medications for this encounter.    Physical Findings: The patient is in no acute distress. Patient is alert and oriented. Wt Readings from Last 3 Encounters:  10/27/19 270 lb 12.8 oz (122.8 kg)  09/14/19 268 lb 3.2 oz (121.7 kg)  08/10/19 266 lb 1.6 oz (120.7 kg)    height is 6' (1.829 m) and weight is 270 lb 12.8 oz (122.8 kg). His temperature is 97 F (36.1 C) (abnormal). His blood pressure is 121/79 and his pulse is 69. His respiration is 20 and oxygen saturation is 99%. .  General: Alert and oriented, in no acute distress HEENT: Head is normocephalic. Extraocular movements are intact. Oropharynx is notable for no visible tumor or thrush.  Oral cavity is clear Neck: Neck is notable for no palpable adenopathy in the cervical or supraclavicular regions Skin: Skin in treatment fields shows satisfactory healing, healthy and intact Psychiatric: Judgment and insight are intact. Affect is appropriate.   Lab Findings: Lab Results  Component Value Date   WBC 5.5 09/14/2019   HGB 11.8 (L) 09/14/2019   HCT 35.4 (L) 09/14/2019   MCV 88.7 09/14/2019   PLT 200 09/14/2019    Lab Results  Component Value Date   TSH 1.350 10/28/2017    Radiographic Findings: NM PET Image Restag (PS) Skull Base To Thigh  Result Date: 10/17/2019 CLINICAL DATA:  Subsequent treatment strategy for carcinoma of the tonsillar fossa. EXAM: NUCLEAR MEDICINE PET SKULL BASE TO THIGH TECHNIQUE: 13.43 mCi F-18 FDG was injected intravenously. Full-ring PET imaging was performed from the skull base to thigh after the radiotracer. CT data was obtained and used for attenuation correction and anatomic localization. Fasting blood glucose: 104 mg/dl COMPARISON:  PET-CT 05/17/2019 FINDINGS: Mediastinal blood pool activity: SUV max 2.90 Liver activity: SUV max NA NECK: No residual measurable mass in the right tonsillar region. No areas of hypermetabolism are  identified. No residual measurable adenopathy or hypermetabolism. Incidental CT findings: none CHEST: No hypermetabolic mediastinal or hilar nodes. No suspicious pulmonary nodules on the CT scan. Incidental CT findings: Right-sided Port-A-Cath in good position without complicating features. ABDOMEN/PELVIS: No abnormal hypermetabolic activity within the liver, pancreas, adrenal glands, or spleen. No hypermetabolic lymph nodes in the abdomen or pelvis. Incidental CT findings: none SKELETON: No focal hypermetabolic activity to suggest skeletal metastasis. Incidental CT findings: none IMPRESSION: 1. Complete metabolic response to treatment. No residual mass, adenopathy or hypermetabolism is identified to suggest residual tumor. 2. No findings for metastatic disease. Electronically Signed   By: Marijo Sanes M.D.   On: 10/17/2019 06:44   IR GASTROSTOMY TUBE REMOVAL  Result Date: 10/06/2019 INDICATION: Patient with history of tonsillar carcinoma and gastrostomy tube placement on 06/06/2019; he has received treatment and is currently eating ; request now received for gastrostomy tube removal. EXAM: GASTROSTOMY TUBE REMOVAL MEDICATIONS: Viscous lidocaine into gastrostomy tube  insertion site tract ANESTHESIA/SEDATION: None CONTRAST:  None FLUOROSCOPY TIME:  None COMPLICATIONS: None immediate. PROCEDURE: Informed consent was obtained from the patient after a thorough discussion of the procedural risks, benefits and alternatives. All questions were addressed. A timeout was performed prior to the initiation of the procedure. Using manual traction the 20 French pull-through gastrostomy tube was removed in its entirety without immediate complications. Silver nitrate was applied onto some surrounding granulation tissue. Gauze dressing applied over site afterwards. Site care instructions reviewed with patient. IMPRESSION: Gastrostomy tube removal as discussed above. Read by: Rowe Robert, PA-C Electronically Signed   By: Sandi Mariscal M.D.   On: 10/06/2019 16:09    Impression/Plan:    1) Head and Neck Cancer Status: complete response to ChRT per PET - I personally reviewed the images with him today from his pre and post treatment scans  2) Nutritional Status: Stable PEG tube: Removed  3) Swallowing: Functional, continue speech-language pathology exercises  4) Dental: Encouraged to continue regular followup with dentistry, and dental hygiene including fluoride rinses. (He is not using the fluoride trays and therefore I sent a message to Dr. Enrique Sack asking for for him to provide a prescription for PreviDent 5000).  6) Thyroid function: screen annually - I added a TSH to his lab orders for June w/ med/onc appt. Lab Results  Component Value Date   TSH 1.350 10/28/2017    7) Other: follow-up in 12 months. The patient was encouraged to call with any issues or questions before then.  F/u in ENT in 15mo, and continue f/u with med/onc. Tumor board will review PET scan.  On date of service, in total, I spent 30 minutes on this encounter.  Patient was seen in person. _____________________________________   Eppie Gibson, MD

## 2019-11-02 NOTE — Progress Notes (Signed)
Oncology Nurse Navigator Documentation  I called and left a voice mail with Eric Weaver informing him that Dr. Enrique Sack sent a prescription for Prevident toothpaste to his preferred pharmacy with refills for one year. I informed Eric Weaver that he would need to continue to get Prevident from his regular dentist after that one year. I left my number to call back if he had any further questions or concerns.   Harlow Asa RN, BSN, OCN Head & Neck Oncology Nurse [place] [facility] Phone # [phone]  Fax # [phone]

## 2019-11-09 ENCOUNTER — Inpatient Hospital Stay (HOSPITAL_BASED_OUTPATIENT_CLINIC_OR_DEPARTMENT_OTHER): Payer: BC Managed Care – PPO | Admitting: Hematology and Oncology

## 2019-11-09 ENCOUNTER — Other Ambulatory Visit: Payer: Self-pay | Admitting: Hematology and Oncology

## 2019-11-09 ENCOUNTER — Inpatient Hospital Stay: Payer: BC Managed Care – PPO | Attending: Hematology and Oncology

## 2019-11-09 ENCOUNTER — Ambulatory Visit: Payer: BC Managed Care – PPO | Admitting: Hematology and Oncology

## 2019-11-09 ENCOUNTER — Encounter: Payer: Self-pay | Admitting: Hematology and Oncology

## 2019-11-09 ENCOUNTER — Other Ambulatory Visit: Payer: BC Managed Care – PPO

## 2019-11-09 ENCOUNTER — Other Ambulatory Visit: Payer: Self-pay

## 2019-11-09 DIAGNOSIS — Z9221 Personal history of antineoplastic chemotherapy: Secondary | ICD-10-CM | POA: Insufficient documentation

## 2019-11-09 DIAGNOSIS — Z1329 Encounter for screening for other suspected endocrine disorder: Secondary | ICD-10-CM

## 2019-11-09 DIAGNOSIS — G893 Neoplasm related pain (acute) (chronic): Secondary | ICD-10-CM | POA: Insufficient documentation

## 2019-11-09 DIAGNOSIS — E559 Vitamin D deficiency, unspecified: Secondary | ICD-10-CM | POA: Insufficient documentation

## 2019-11-09 DIAGNOSIS — R634 Abnormal weight loss: Secondary | ICD-10-CM | POA: Diagnosis not present

## 2019-11-09 DIAGNOSIS — Z95828 Presence of other vascular implants and grafts: Secondary | ICD-10-CM

## 2019-11-09 DIAGNOSIS — I10 Essential (primary) hypertension: Secondary | ICD-10-CM | POA: Diagnosis not present

## 2019-11-09 DIAGNOSIS — K219 Gastro-esophageal reflux disease without esophagitis: Secondary | ICD-10-CM | POA: Insufficient documentation

## 2019-11-09 DIAGNOSIS — Z923 Personal history of irradiation: Secondary | ICD-10-CM | POA: Insufficient documentation

## 2019-11-09 DIAGNOSIS — Z79899 Other long term (current) drug therapy: Secondary | ICD-10-CM | POA: Insufficient documentation

## 2019-11-09 DIAGNOSIS — F329 Major depressive disorder, single episode, unspecified: Secondary | ICD-10-CM | POA: Diagnosis not present

## 2019-11-09 DIAGNOSIS — C090 Malignant neoplasm of tonsillar fossa: Secondary | ICD-10-CM | POA: Diagnosis not present

## 2019-11-09 DIAGNOSIS — Z87442 Personal history of urinary calculi: Secondary | ICD-10-CM | POA: Insufficient documentation

## 2019-11-09 DIAGNOSIS — R5383 Other fatigue: Secondary | ICD-10-CM

## 2019-11-09 LAB — CMP (CANCER CENTER ONLY)
ALT: 31 U/L (ref 0–44)
AST: 27 U/L (ref 15–41)
Albumin: 4.1 g/dL (ref 3.5–5.0)
Alkaline Phosphatase: 63 U/L (ref 38–126)
Anion gap: 7 (ref 5–15)
BUN: 14 mg/dL (ref 6–20)
CO2: 27 mmol/L (ref 22–32)
Calcium: 9.4 mg/dL (ref 8.9–10.3)
Chloride: 106 mmol/L (ref 98–111)
Creatinine: 1.07 mg/dL (ref 0.61–1.24)
GFR, Est AFR Am: >60 mL/min (ref >60)
GFR, Estimated: >60 mL/min (ref >60)
Glucose, Bld: 83 mg/dL (ref 70–99)
Potassium: 4.4 mmol/L (ref 3.5–5.1)
Sodium: 140 mmol/L (ref 135–145)
Total Bilirubin: 0.3 mg/dL (ref 0.3–1.2)
Total Protein: 7.6 g/dL (ref 6.5–8.1)

## 2019-11-09 LAB — CBC WITH DIFFERENTIAL (CANCER CENTER ONLY)
Abs Immature Granulocytes: 0.01 10*3/uL (ref 0.00–0.07)
Basophils Absolute: 0 10*3/uL (ref 0.0–0.1)
Basophils Relative: 1 %
Eosinophils Absolute: 0.1 10*3/uL (ref 0.0–0.5)
Eosinophils Relative: 1 %
HCT: 40.4 % (ref 39.0–52.0)
Hemoglobin: 13.9 g/dL (ref 13.0–17.0)
Immature Granulocytes: 0 %
Lymphocytes Relative: 19 %
Lymphs Abs: 1.1 10*3/uL (ref 0.7–4.0)
MCH: 31.2 pg (ref 26.0–34.0)
MCHC: 34.4 g/dL (ref 30.0–36.0)
MCV: 90.8 fL (ref 80.0–100.0)
Monocytes Absolute: 0.4 10*3/uL (ref 0.1–1.0)
Monocytes Relative: 7 %
Neutro Abs: 4.2 10*3/uL (ref 1.7–7.7)
Neutrophils Relative %: 72 %
Platelet Count: 198 10*3/uL (ref 150–400)
RBC: 4.45 MIL/uL (ref 4.22–5.81)
RDW: 12.7 % (ref 11.5–15.5)
WBC Count: 5.8 10*3/uL (ref 4.0–10.5)
nRBC: 0 % (ref 0.0–0.2)

## 2019-11-09 LAB — MAGNESIUM: Magnesium: 1.8 mg/dL (ref 1.7–2.4)

## 2019-11-09 NOTE — Progress Notes (Signed)
[place] [phone]   [phone]  PROGRESS NOTE  Patient Care Team: [name], DO as PCP - General (Family Medicine) Izora Gala, MD as Consulting Physician (Otolaryngology) Eppie Gibson, MD as Attending Physician (Radiation Oncology) Leota Sauers, RN (Inactive) as Registered Nurse Orson Slick, MD as Consulting Physician (Hematology and Oncology) Sharen Counter, CCC-SLP as Speech Language Pathologist (Speech Pathology) Karie Mainland, RD as Dietitian (Nutrition) Wynelle Beckmann, Melodie Bouillon, PT as Physical Therapist (Physical Therapy) Kennith Center, LCSW as Social Worker [place]elt, Stephani Police, RN as Oncology Nurse Navigator (Oncology)  Hematological/Oncological History # 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 CT scan performed showing 4.1 x 4.0 x 5.3 cm mass centered at the right palatine tonsil, highly concerning for primary head and neck malignancy, likely squamous cell carcinoma, additionally there were partially necrotic right level II and III adenopathy. 2) 04/26/2019: referred to ENT, underwent right tonsillar biopsy confirming invasive squamous cell cancer 3) 05/17/2019: PET CT scan showed hypermetabolic mass at right [place] tonsil and hypermetabolic right level 2 and 3 lymph nodes. There was also mildly hypermetabolic lymph node in the left level 2 neck.  4) 05/24/2019: establish care with Dr. Lorenso Weaver  5) 06/07/2019: started radiation therapy with Dr. Isidore Moos 6) 06/16/2019: Started Cisplatin 40mg /m2 with concurrent radiation 7) 06/23/2019: Week 2 cisplatin 40mg /m2 with concurrent radiation 9) 06/30/2019: Week 3 cisplatin 40mg /m2 with concurrent radiation 10) 07/07/2019: Week 4 cisplatin 40mg /m2 with concurrent radiation 11) 07/14/2019: Week 5 cisplatin 40mg /m2 with concurrent radiation 12) 07/21/2019: Week 6 cisplatin 40mg /m2 with  concurrent radiation 13) 07/25/2019: Completion of Chemoradiation.  14) 10/16/2019:  PET CT showed complete metabolic response to treatment with no residual tumor or adenopathy  Interval History:  Eric Weaver 49 y.o. male with medical history significant for carcinoma of the tonsillar fossa  presents for a follow up visit. The patient's last visit was on 09/14/2019. In the interim he had a PET CT on 10/16/2019 which showed complete metabolic response to treatment with no residual tumor or adenopathy.   On exam today Eric Weaver notes that he is doing quite well since his last visit approximately 8 weeks ago.  He says that he does have occasional trouble with certain foods particularly bread and bacon.  Of note he believes that steak and chicken are some of the easier things to eat, though if the chicken is dry he has some trouble swallowing.  His pain is minimal and he occasionally uses ibuprofen, but mostly for aches and pains in other parts of his body.  He reports that the main symptom he is has is some residual fatigue.  He notes that he is tired more often than not but is still able to complete a full day of work.  He has otherwise been well with no issues of fevers, chills, sweats, nausea, vomiting or diarrhea.  A full 10 point ROS is listed below.  The only other request the patient had was for his port to be removed today.  MEDICAL HISTORY:  Past Medical History:  Diagnosis Date  . Depression   . Essential hypertension 08/02/2018  . GERD (gastroesophageal reflux disease)   . History of nephrolithiasis   . Vitamin D deficiency     SURGICAL HISTORY: Past Surgical History:  Procedure Laterality Date  . Biopsy of right tonsil Right 04/24/2019   Positive for squamous cell carcinoma  .  IR GASTROSTOMY TUBE MOD SED  06/06/2019  . IR GASTROSTOMY TUBE REMOVAL  10/06/2019  . IR IMAGING GUIDED PORT INSERTION  06/06/2019    ALLERGIES:  has No Known Allergies.  MEDICATIONS:  Current Outpatient  Medications  Medication Sig Dispense Refill  . benzonatate (TESSALON) 100 MG capsule benzonatate 100 mg capsule  TAKE 1 2 CAPSULES (100 200 MG TOTAL) BY MOUTH 3 (THREE) TIMES DAILY.    Marland Kitchen escitalopram (LEXAPRO) 20 MG tablet Take 1 tablet (20 mg total) by mouth daily. (Needs to be seen before next refill) 30 tablet 0  . esomeprazole (NEXIUM) 20 MG capsule Take 1 capsule (20 mg total) by mouth daily at 12 noon. 90 capsule 3  . guaiFENesin (ROBITUSSIN) 100 MG/5ML liquid Take 200 mg by mouth 3 (three) times daily as needed for cough.    Marland Kitchen HYDROcodone-acetaminophen (NORCO/VICODIN) 5-325 MG tablet hydrocodone 5 mg-acetaminophen 325 mg tablet  TAKE 1 TO 2 TABLETS BY MOUTH EVERY 6 HOURS AS NEEDED FOR UP TO 5 DAYS FOR MODERATE PAIN    . ibuprofen (ADVIL) 200 MG tablet Take 200 mg by mouth every 6 (six) hours as needed. Takes 3-4    . lidocaine (XYLOCAINE) 2 % solution Patient: Mix 1part 2% viscous lidocaine, 1part H20. Swish & swallow 28mL of diluted mixture, 48min before meals and at bedtime, up to QID (Patient not taking: Reported on 09/14/2019) 200 mL 4  . lidocaine-prilocaine (EMLA) cream Apply 1 application topically as needed. (Patient not taking: Reported on 10/27/2019) 30 g 2  . ondansetron (ZOFRAN) 8 MG tablet Take 1 tablet (8 mg total) by mouth every 8 (eight) hours as needed for nausea or vomiting. (Patient not taking: Reported on 09/14/2019) 30 tablet 1  . oxyCODONE (OXY IR/ROXICODONE) 5 MG immediate release tablet Take 1 tablet (5 mg total) by mouth every 4 (four) hours as needed for severe pain. (Patient not taking: Reported on 10/27/2019) 60 tablet 0  . prochlorperazine (COMPAZINE) 10 MG tablet Take 1 tablet (10 mg total) by mouth every 6 (six) hours as needed for nausea or vomiting. (Patient not taking: Reported on 09/14/2019) 30 tablet 1  . senna-docusate (SENOKOT-S) 8.6-50 MG tablet Take 1 tablet by mouth 2 (two) times daily as needed for mild constipation. (Patient not taking: Reported on  09/14/2019) 30 tablet 3  . sodium fluoride (PREVIDENT 5000 PLUS) 1.1 % CREA dental cream Apply to tooth brush. Brush teeth for 2 minutes. Spit out excess. So NOT swallow. Repeat nightly. (Patient not taking: Reported on 09/14/2019) 1 Tube prn  . sucralfate (CARAFATE) 1 g tablet Take 1 tablet (1 g total) by mouth 4 (four) times daily -  with meals and at bedtime. (Patient not taking: Reported on 09/14/2019) 120 tablet 1  . tadalafil (CIALIS) 5 MG tablet Take 5 mg by mouth daily as needed.    . Vitamin D, Ergocalciferol, (DRISDOL) 1.25 MG (50000 UT) CAPS capsule TAKE 1 CAPSULE BY MOUTH WEEKLY (Patient not taking: Reported on 10/27/2019) 12 capsule 1   No current facility-administered medications for this visit.    REVIEW OF SYSTEMS:   Constitutional: ( - ) fevers, ( - )  chills , ( - ) night sweats Eyes: ( - ) blurriness of vision, ( - ) double vision, ( - ) watery eyes Ears, nose, mouth, throat, and face: ( + ) mucositis, ( + ) sore throat Respiratory: ( + ) cough, ( - ) dyspnea, ( - ) wheezes Cardiovascular: ( - ) palpitation, ( - ) chest  discomfort, ( - ) lower extremity swelling Gastrointestinal:  ( - ) nausea, ( - ) heartburn, ( - ) change in bowel habits Skin: ( - ) abnormal skin rashes Lymphatics: ( - ) new lymphadenopathy, ( - ) easy bruising Neurological: ( - ) numbness, ( - ) tingling, ( - ) new weaknesses Behavioral/Psych: ( - ) mood change, ( - ) new changes  All other systems were reviewed with the patient and are negative.  PHYSICAL EXAMINATION: ECOG PERFORMANCE STATUS: 1 - Symptomatic but completely ambulatory  There were no vitals filed for this visit. There were no vitals filed for this visit.  GENERAL: well appearing obese Caucasian male in NAD  SKIN: skin color, texture, turgor are normal, no rashes or significant lesions EYES: conjunctiva are pink and non-injected, sclera clear LUNGS: clear to auscultation and percussion with normal breathing effort HEART: regular rate &  rhythm and no murmurs and no lower extremity edema ABDOMEN: scar where G tube was in place  Musculoskeletal: no cyanosis of digits and no clubbing  PSYCH: alert & oriented x 3, fluent speech NEURO: no focal motor/sensory deficits  LABORATORY DATA:  I have reviewed the data as listed CBC Latest Ref Rng & Units 11/09/2019 09/14/2019 08/10/2019  WBC 4.0 - 10.5 K/uL 5.8 5.5 4.5  Hemoglobin 13.0 - 17.0 g/dL 13.9 11.8(L) 12.4(L)  Hematocrit 39 - 52 % 40.4 35.4(L) 37.2(L)  Platelets 150 - 400 K/uL 198 200 251    CMP Latest Ref Rng & Units 11/09/2019 09/14/2019 08/10/2019  Glucose 70 - 99 mg/dL 83 99 99  BUN 6 - 20 mg/dL 14 13 19   Creatinine 0.61 - 1.24 mg/dL 1.07 0.86 1.09  Sodium 135 - 145 mmol/L 140 139 142  Potassium 3.5 - 5.1 mmol/L 4.4 3.9 4.8  Chloride 98 - 111 mmol/L 106 107 104  CO2 22 - 32 mmol/L 27 25 27   Calcium 8.9 - 10.3 mg/dL 9.4 9.3 9.7  Total Protein 6.5 - 8.1 g/dL 7.6 7.5 8.1  Total Bilirubin 0.3 - 1.2 mg/dL 0.3 0.6 0.4  Alkaline Phos 38 - 126 U/L 63 66 81  AST 15 - 41 U/L 27 25 30   ALT 0 - 44 U/L 31 36 43    RADIOGRAPHIC STUDIES: NM PET Image Restag (PS) Skull Base To Thigh  Result Date: 10/17/2019 CLINICAL DATA:  Subsequent treatment strategy for carcinoma of the tonsillar fossa. EXAM: NUCLEAR MEDICINE PET SKULL BASE TO THIGH TECHNIQUE: 13.43 mCi F-18 FDG was injected intravenously. Full-ring PET imaging was performed from the skull base to thigh after the radiotracer. CT data was obtained and used for attenuation correction and anatomic localization. Fasting blood glucose: 104 mg/dl COMPARISON:  PET-CT 05/17/2019 FINDINGS: Mediastinal blood pool activity: SUV max 2.90 Liver activity: SUV max NA NECK: No residual measurable mass in the right tonsillar region. No areas of hypermetabolism are identified. No residual measurable adenopathy or hypermetabolism. Incidental CT findings: none CHEST: No hypermetabolic mediastinal or hilar nodes. No suspicious pulmonary nodules on the CT  scan. Incidental CT findings: Right-sided Port-A-Cath in good position without complicating features. ABDOMEN/PELVIS: No abnormal hypermetabolic activity within the liver, pancreas, adrenal glands, or spleen. No hypermetabolic lymph nodes in the abdomen or pelvis. Incidental CT findings: none SKELETON: No focal hypermetabolic activity to suggest skeletal metastasis. Incidental CT findings: none IMPRESSION: 1. Complete metabolic response to treatment. No residual mass, adenopathy or hypermetabolism is identified to suggest residual tumor. 2. No findings for metastatic disease. Electronically Signed   By: Marijo Sanes  M.D.   On: 10/17/2019 06:44    ASSESSMENT & PLAN Eric Weaver 49 y.o. male with medical history significant for carcinoma of the tonsillar fossa  presents for a follow up visit.  He has successfully completed his chemoradiation therapy with the last dose of radiation administered on 07/25/2019.  PET/CT on 10/17/2019 showed complete metabolic response to treatment with no residual mass or adenopathy.  On exam today Eric Weaver has continued to improve. He has no major symptoms or changes in health since his last visit. Today he only requests we have the port removed.   Patient is to begin surveillance with fibro-optic exams with ENT. These should occur at least every 3 months in the first year and every 6 months in the second year, per NCCN guidelines. Oncology will take a more peripheral role and follow Eric Weaver every 6 months or sooner if symptoms develop.  # Carcinoma of the Tonsillar Fossa (Stage II, T3N2M0 p16+) --completed Chemoradiation therapy with cisplatin 40mg /m2 on 07/25/2019. Received 6 doses of cisplatin during the course of radiation.  --Hgb and Cr are stable on therapy. Stable WBC/Hgb on last CBC -- PET CT on 10/16/2019 showed complete metabolic response to treatment with no residual mass or adenopathy. Per NCCN guidelines, there is no data to support routine imaging in an  asymptomatic patient after a 12 week post treatment negative PET. Can image if new or concerning symptoms arise.  --patient to be reconnected with ENT for routine surveillance, consisting of a mirror/ fibro-optic exam at least every 3 months for the 1st year, at least every 6 months for the subsequent 2 years.  --patient requested port to be pulled. Will place referral to IR today.  --last TSH WNL on 10/29/2019. Repeat at next visit in 6 months.  --Plan for RTC in 6 months with labs  #Throat Pain, improving --no longer requiring oxycodone or fentanyl.  --rare use of ibuprofen, often not for throat pain.  --continue to monitor   #Nutritional Status, improved --PEG tube removed, taking all nutrition by PO --8 lb weight loss since Feb 2021 --continue to monitor.   No orders of the defined types were placed in this encounter.  All questions were answered. The patient knows to call the clinic with any problems, questions or concerns.  A total of more than 20 minutes were spent on this encounter and over half of that time was spent on counseling and coordination of care as outlined above.   Ledell Peoples, MD Department of Hematology/Oncology [facility] Phone: [phone] Pager: [phone] Email: [email or web address]  11/09/2019 3:49 PM

## 2019-11-10 LAB — TSH: TSH: 1.283 u[IU]/mL (ref 0.320–4.118)

## 2019-11-14 ENCOUNTER — Telehealth: Payer: Self-pay | Admitting: Hematology and Oncology

## 2019-11-14 NOTE — Telephone Encounter (Signed)
Scheduled per los. Called and left message. Mailed printout

## 2019-11-23 ENCOUNTER — Other Ambulatory Visit: Payer: Self-pay | Admitting: Radiology

## 2019-11-24 ENCOUNTER — Other Ambulatory Visit: Payer: Self-pay

## 2019-11-24 ENCOUNTER — Ambulatory Visit (HOSPITAL_COMMUNITY)
Admission: RE | Admit: 2019-11-24 | Discharge: 2019-11-24 | Disposition: A | Discharge status: Home / Self Care | Payer: BC Managed Care – PPO | Source: Ambulatory Visit | Attending: Hematology and Oncology | Admitting: Hematology and Oncology

## 2019-11-24 ENCOUNTER — Encounter (HOSPITAL_COMMUNITY): Payer: Self-pay

## 2019-11-24 DIAGNOSIS — Z85818 Personal history of malignant neoplasm of other sites of lip, oral cavity, and pharynx: Secondary | ICD-10-CM | POA: Diagnosis not present

## 2019-11-24 DIAGNOSIS — I10 Essential (primary) hypertension: Secondary | ICD-10-CM | POA: Insufficient documentation

## 2019-11-24 DIAGNOSIS — Z452 Encounter for adjustment and management of vascular access device: Secondary | ICD-10-CM | POA: Insufficient documentation

## 2019-11-24 DIAGNOSIS — Z8249 Family history of ischemic heart disease and other diseases of the circulatory system: Secondary | ICD-10-CM | POA: Insufficient documentation

## 2019-11-24 DIAGNOSIS — Z79899 Other long term (current) drug therapy: Secondary | ICD-10-CM | POA: Diagnosis not present

## 2019-11-24 DIAGNOSIS — Z95828 Presence of other vascular implants and grafts: Secondary | ICD-10-CM

## 2019-11-24 DIAGNOSIS — F329 Major depressive disorder, single episode, unspecified: Secondary | ICD-10-CM | POA: Diagnosis not present

## 2019-11-24 DIAGNOSIS — K219 Gastro-esophageal reflux disease without esophagitis: Secondary | ICD-10-CM | POA: Insufficient documentation

## 2019-11-24 HISTORY — PX: IR REMOVAL TUN ACCESS W/ PORT W/O FL MOD SED: IMG2290

## 2019-11-24 MED ORDER — CEFAZOLIN SODIUM-DEXTROSE 2-4 GM/100ML-% IV SOLN
INTRAVENOUS | Status: AC
  Filled 2019-11-24: qty 100

## 2019-11-24 MED ORDER — MIDAZOLAM HCL 2 MG/2ML IJ SOLN
INTRAMUSCULAR | Status: AC
  Filled 2019-11-24: qty 2

## 2019-11-24 MED ORDER — FENTANYL CITRATE (PF) 100 MCG/2ML IJ SOLN
INTRAMUSCULAR | Status: AC | PRN
  Administered 2019-11-24 (×2): 50 ug via INTRAVENOUS

## 2019-11-24 MED ORDER — LIDOCAINE-EPINEPHRINE 1 %-1:100000 IJ SOLN
INTRAMUSCULAR | Status: AC
  Filled 2019-11-24: qty 1

## 2019-11-24 MED ORDER — CEFAZOLIN SODIUM-DEXTROSE 2-4 GM/100ML-% IV SOLN
2.0000 g | Freq: Once | INTRAVENOUS | Status: AC
  Administered 2019-11-24: 2 g via INTRAVENOUS

## 2019-11-24 MED ORDER — MIDAZOLAM HCL 2 MG/2ML IJ SOLN
INTRAMUSCULAR | Status: AC | PRN
  Administered 2019-11-24 (×2): 0.5 mg via INTRAVENOUS
  Administered 2019-11-24: 1 mg via INTRAVENOUS

## 2019-11-24 MED ORDER — FENTANYL CITRATE (PF) 100 MCG/2ML IJ SOLN
INTRAMUSCULAR | Status: AC
  Filled 2019-11-24: qty 2

## 2019-11-24 NOTE — Discharge Instructions (Signed)
Implanted Port Removal, Care After This sheet gives you information about how to care for yourself after your procedure. Your health care provider may also give you more specific instructions. If you have problems or questions, contact your health care provider. What can I expect after the procedure? After the procedure, it is common to have:  Soreness or pain near your incision.  Some swelling or bruising near your incision. Follow these instructions at home: Medicines  Take over-the-counter and prescription medicines only as told by your health care provider.  If you were prescribed an antibiotic medicine, take it as told by your health care provider. Do not stop taking the antibiotic even if you start to feel better. Bathing Do not take baths, swim, or use a hot tub until your health care provider approves. You may remove dressing tomorrow around 3:30 PM and shower.  Follow instructions from your health care provider about how to take care of your incision. Make sure you: ? Wash your hands with soap and water before you change your bandage (dressing). If soap and water are not available, use hand sanitizer. ? Change your dressing as told by your health care provider. ? Keep your dressing dry. ? Leave stitches (sutures), skin glue, or adhesive strips in place. These skin closures may need to stay in place for 2 weeks or longer. If adhesive strip edges start to loosen and curl up, you may trim the loose edges. Do not remove adhesive strips completely unless your health care provider tells you to do that.  Check your incision area every day for signs of infection. Check for: ? More redness, swelling, or pain. ? More fluid or blood. ? Warmth. ? Pus or a bad smell. Driving   Do not drive for 24 hours if you were given a medicine to help you relax (sedative) during your procedure.  If you did not receive a sedative, ask your health care provider when it is safe to  drive. Activity  Return to your normal activities as told by your health care provider. Ask your health care provider what activities are safe for you.  Do not lift anything that is heavier than 10 lb (4.5 kg), or the limit that you are told, until your health care provider says that it is safe.  Do not do activities that involve lifting your arms over your head. General instructions  Do not use any products that contain nicotine or tobacco, such as cigarettes and e-cigarettes. These can delay healing. If you need help quitting, ask your health care provider.  Keep all follow-up visits as told by your health care provider. This is important. Contact a health care provider if:  You have more redness, swelling, or pain around your incision.  You have more fluid or blood coming from your incision.  Your incision feels warm to the touch.  You have pus or a bad smell coming from your incision.  You have pain that is not relieved by your pain medicine. Get help right away if you have:  A fever or chills.  Chest pain.  Difficulty breathing. Summary  After the procedure, it is common to have pain, soreness, swelling, or bruising near your incision.  If you were prescribed an antibiotic medicine, take it as told by your health care provider. Do not stop taking the antibiotic even if you start to feel better.  Do not drive for 24 hours if you were given a sedative during your procedure.  Return to your  normal activities as told by your health care provider. Ask your health care provider what activities are safe for you. This information is not intended to replace advice given to you by your health care provider. Make sure you discuss any questions you have with your health care provider. Document Revised: 07/01/2017 Document Reviewed: 07/01/2017 Elsevier Patient Education  [employer].      Moderate Conscious Sedation, Adult, Care After These instructions provide you  with information about caring for yourself after your procedure. Your health care provider may also give you more specific instructions. Your treatment has been planned according to current medical practices, but problems sometimes occur. Call your health care provider if you have any problems or questions after your procedure. What can I expect after the procedure? After your procedure, it is common:  To feel sleepy for several hours.  To feel clumsy and have poor balance for several hours.  To have poor judgment for several hours.  To vomit if you eat too soon. Follow these instructions at home: For at least 24 hours after the procedure:   Do not: ? Participate in activities where you could fall or become injured. ? Drive. ? Use heavy machinery. ? Drink alcohol. ? Take sleeping pills or medicines that cause drowsiness. ? Make important decisions or sign legal documents. ? Take care of children on your own.  Rest. Eating and drinking  Follow the diet recommended by your health care provider.  If you vomit: ? Drink water, juice, or soup when you can drink without vomiting. ? Make sure you have little or no nausea before eating solid foods. General instructions  Have a responsible adult stay with you until you are awake and alert.  Take over-the-counter and prescription medicines only as told by your health care provider.  If you smoke, do not smoke without supervision.  Keep all follow-up visits as told by your health care provider. This is important. Contact a health care provider if:  You keep feeling nauseous or you keep vomiting.  You feel light-headed.  You develop a rash.  You have a fever. Get help right away if:  You have trouble breathing. This information is not intended to replace advice given to you by your health care provider. Make sure you discuss any questions you have with your health care provider. Document Revised: 04/30/2017 Document Reviewed:  09/07/2015 Elsevier Patient Education  2020 [employer].

## 2019-11-24 NOTE — Procedures (Signed)
Pre Procedural Dx: Poor venous access Post Procedural Dx: Same  Successful removal of anterior chest wall port-a-cath.  EBL: Minimal  No immediate post procedural complications.   Jay Novak Stgermaine, MD Pager #: [phone]   

## 2019-11-24 NOTE — H&P (Signed)
Chief Complaint: Patient was seen in consultation today for tonsillar cancer  Referring Physician(s): Weaver,Eric T Weaver  Supervising Physician: Eric Weaver  Patient Status: [facility] - Out-pt  History of Present Illness: Eric Weaver is a 49 y.o. male with past medical history of GERD, nephrolithiasis, depression who is known to IR from Port-A-Cath and gastrostomy tube placement by Dr. Kathlene Cote 06/06/19.  He has completed treatment for his tonsillar cancer without since of disease recurrence.  His gastrostomy tube was removed 11/30/67 without complication.  He presents to IR today for Port-A-Cath removal.   Eric Weaver presents to IR today in his usual state of health. He denies new concerns or complaints.  He has been NPO.  He does not have any allergies.  He does not take blood thinners.   Past Medical History:  Diagnosis Date  . Depression   . Essential hypertension 08/02/2018  . GERD (gastroesophageal reflux disease)   . History of nephrolithiasis   . Vitamin D deficiency     Past Surgical History:  Procedure Laterality Date  . Biopsy of right tonsil Right 04/24/2019   Positive for squamous cell carcinoma  . IR GASTROSTOMY TUBE MOD SED  06/06/2019  . IR GASTROSTOMY TUBE REMOVAL  10/06/2019  . IR IMAGING GUIDED PORT INSERTION  06/06/2019    Allergies: Patient has no known allergies.  Medications: Prior to Admission medications   Medication Sig Start Date End Date Taking? Authorizing Provider  escitalopram (LEXAPRO) 20 MG tablet Take 1 tablet (20 mg total) by mouth daily. (Needs to be seen before next refill) 04/14/19  Yes Eric Weaver  esomeprazole (NEXIUM) 20 MG capsule Take 1 capsule (20 mg total) by mouth daily at 12 noon. 06/16/19  Yes Weaver, Eric Jewels Weaver, Weaver  ibuprofen (ADVIL) 200 MG tablet Take 200 mg by mouth every 6 (six) hours as needed. Takes 3-4   Yes Eric Weaver  benzonatate (TESSALON) 100 MG capsule benzonatate 100 mg capsule  TAKE 1 2  CAPSULES (100 200 MG TOTAL) BY MOUTH 3 (THREE) TIMES DAILY.    Eric Weaver  guaiFENesin (ROBITUSSIN) 100 MG/5ML liquid Take 200 mg by mouth 3 (three) times daily as needed for cough.    Eric Weaver  HYDROcodone-acetaminophen (NORCO/VICODIN) 5-325 MG tablet hydrocodone 5 mg-acetaminophen 325 mg tablet  TAKE 1 TO 2 TABLETS BY MOUTH EVERY 6 HOURS AS NEEDED FOR UP TO 5 DAYS FOR MODERATE PAIN    Eric Weaver  lidocaine (XYLOCAINE) 2 % solution Patient: Mix 1part 2% viscous lidocaine, 1part Eric Weaver. Swish & swallow 73mL of diluted mixture, 65min before meals and at bedtime, up to QID Patient not taking: Reported on 09/14/2019 06/12/19   Eric Weaver  lidocaine-prilocaine (EMLA) cream Apply 1 application topically as needed. Patient not taking: Reported on 10/27/2019 06/14/19   Eric Weaver  ondansetron (ZOFRAN) 8 MG tablet Take 1 tablet (8 mg total) by mouth every 8 (eight) hours as needed for nausea or vomiting. Patient not taking: Reported on 09/14/2019 06/14/19   Eric Weaver  oxyCODONE (OXY IR/ROXICODONE) 5 MG immediate release tablet Take 1 tablet (5 mg total) by mouth every 4 (four) hours as needed for severe pain. Patient not taking: Reported on 10/27/2019 08/02/19   Eric Weaver  prochlorperazine (COMPAZINE) 10 MG tablet Take 1 tablet (10 mg total) by mouth every 6 (six) hours as needed for nausea or vomiting. Patient not taking: Reported on 09/14/2019 06/14/19  Eric Weaver  senna-docusate (SENOKOT-S) 8.6-50 MG tablet Take 1 tablet by mouth 2 (two) times daily as needed for mild constipation. Patient not taking: Reported on 09/14/2019 07/27/19   Eric Weaver  sodium fluoride (PREVIDENT 5000 PLUS) 1.1 % CREA dental cream Apply to tooth brush. Brush teeth for 2 minutes. Spit out excess. So NOT swallow. Repeat nightly. Patient not taking: Reported on 09/14/2019 05/17/19   Eric Weaver, Eric Weaver  sucralfate (CARAFATE) 1 g tablet  Take 1 tablet (1 g total) by mouth 4 (four) times daily -  with meals and at bedtime. Patient not taking: Reported on 09/14/2019 07/13/19   Eric Weaver  tadalafil (CIALIS) 5 MG tablet Take 5 mg by mouth daily as needed.    Eric Weaver  Vitamin D, Ergocalciferol, (DRISDOL) 1.25 MG (50000 UT) CAPS capsule TAKE 1 CAPSULE BY MOUTH WEEKLY Patient not taking: Reported on 10/27/2019 01/18/19   Eric Norlander, Weaver     Family History  Problem Relation Age of Onset  . Hypertension Mother   . Heart attack Father     Social History   Socioeconomic History  . Marital status: Married    Spouse name: Not on file  . Number of children: Not on file  . Years of education: Not on file  . Highest education level: Not on file  Occupational History  . Not on file  Tobacco Use  . Smoking status: Never Smoker  . Smokeless tobacco: Never Used  Vaping Use  . Vaping Use: Never used  Substance and Sexual Activity  . Alcohol use: Yes    Comment: occasional  . Drug use: No  . Sexual activity: Not Currently  Other Topics Concern  . Not on file  Social History Narrative  . Not on file   Social Determinants of Health   Financial Resource Strain:   . Difficulty of Paying Living Expenses:   Food Insecurity:   . Worried About Charity fundraiser in the Last Year:   . Arboriculturist in the Last Year:   Transportation Needs:   . Film/video editor (Medical):   Marland Kitchen Lack of Transportation (Non-Medical):   Physical Activity:   . Days of Exercise per Week:   . Minutes of Exercise per Session:   Stress:   . Feeling of Stress :   Social Connections:   . Frequency of Communication with Friends and Family:   . Frequency of Social Gatherings with Friends and Family:   . Attends Religious Services:   . Active Member of Clubs or Organizations:   . Attends Archivist Meetings:   Marland Kitchen Marital Status:      Review of Systems: A 12 point ROS discussed and pertinent positives  are indicated in the HPI above.  All other systems are negative.  Review of Systems  Constitutional: Negative for fatigue and fever.  Respiratory: Negative for cough and shortness of breath.   Cardiovascular: Negative for chest pain.  Gastrointestinal: Negative for abdominal pain, diarrhea, nausea and vomiting.  Genitourinary: Negative for dysuria.  Musculoskeletal: Negative for back pain.  Neurological: Negative for facial asymmetry.  Psychiatric/Behavioral: Negative for behavioral problems and confusion.    Vital Signs: BP (!) 147/89   Pulse 74   Temp 98.6 F (37 C) (Oral)   Resp 17   Ht 6' (1.829 Weaver)   Wt 260 lb (117.9 kg)   SpO2 99%   BMI 35.26 kg/Weaver  Physical Exam Vitals and nursing note reviewed.  Constitutional:      General: He is not in acute distress.    Appearance: Normal appearance. He is not ill-appearing.  HENT:     Mouth/Throat:     Mouth: Mucous membranes are moist.     Pharynx: Oropharynx is clear.  Neck:     Comments: Port-A-Cath in place Cardiovascular:     Rate and Rhythm: Normal rate and regular rhythm.  Pulmonary:     Effort: Pulmonary effort is normal. No respiratory distress.     Breath sounds: Normal breath sounds.  Musculoskeletal:     Cervical back: Normal range of motion and neck supple.  Skin:    General: Skin is warm and dry.  Neurological:     General: No focal deficit present.     Mental Status: He is alert and oriented to person, place, and time. Mental status is at baseline.  Psychiatric:        Mood and Affect: Mood normal.        Behavior: Behavior normal.        Thought Content: Thought content normal.        Judgment: Judgment normal.      Weaver Evaluation Airway: WNL Heart: WNL Abdomen: WNL Chest/ Lungs: WNL ASA  Classification: 3 Mallampati/Airway Score: Two   Imaging: No results found.  Labs:  CBC: Recent Labs    07/27/19 1045 08/10/19 0932 09/14/19 1444 11/09/19 1450  WBC 2.3* 4.5 5.5 5.8  HGB 12.1*  12.4* 11.8* 13.9  HCT 35.9* 37.2* 35.4* 40.4  PLT 243 251 200 198    COAGS: Recent Labs    04/23/19 0613 06/06/19 1025  INR 1.0 0.9    BMP: Recent Labs    07/27/19 1045 08/10/19 0945 09/14/19 1444 11/09/19 1450  NA 135 142 139 140  K 4.8 4.8 3.9 4.4  CL 97* 104 107 106  CO2 28 27 25 27   GLUCOSE 112* 99 99 83  BUN 15 19 13 14   CALCIUM 9.6 9.7 9.3 9.4  CREATININE 0.93 1.09 0.86 1.07  GFRNONAA >60 >60 >60 >60  GFRAA >60 >60 >60 >60    LIVER FUNCTION TESTS: Recent Labs    07/27/19 1045 08/10/19 0945 09/14/19 1444 11/09/19 1450  BILITOT 0.6 0.4 0.6 0.3  AST 20 30 25 27   ALT 48* 43 36 31  ALKPHOS 78 81 66 63  PROT 8.1 8.1 7.5 7.6  ALBUMIN 3.9 3.9 4.2 4.1    TUMOR MARKERS: No results for input(s): AFPTM, CEA, CA199, CHROMGRNA in the last 8760 hours.  Assessment and Plan: Patient with past medical history of tonsillar cancer known to IR from Port-A-Cath and gastrostomy tube placement 06/06/19 with Dr. Kathlene Cote presents at the end of treatment for Port-A-Cath removal.  He has already had his G-tube removed.   Case reviewed by Dr. Pascal Lux who approves patient for procedure.  Patient presents today in their usual state of health.  He has been NPO and is not currently on blood thinners.   Risks and benefits of image guided port-a-catheter placement was discussed with the patient including, but not limited to bleeding, infection, pneumothorax, or fibrin sheath development and need for additional procedures.  All of the patient's questions were answered, patient is agreeable to proceed. Consent signed and in chart.  Thank you for this interesting consult.  I greatly enjoyed meeting Eric Weaver and look forward to participating in their care.  A copy of this report was sent to  the requesting provider on this date.  Electronically Signed: Docia Barrier, PA 11/24/2019, 2:21 PM   I spent a total of    25 Minutes in face to face in clinical consultation,  greater than 50% of which was counseling/coordinating care for tonsillar cancer.

## 2020-03-06 DIAGNOSIS — J029 Acute pharyngitis, unspecified: Secondary | ICD-10-CM | POA: Diagnosis not present

## 2020-03-06 DIAGNOSIS — Z20822 Contact with and (suspected) exposure to covid-19: Secondary | ICD-10-CM | POA: Diagnosis not present

## 2020-03-10 DIAGNOSIS — Z20822 Contact with and (suspected) exposure to covid-19: Secondary | ICD-10-CM | POA: Diagnosis not present

## 2020-03-12 ENCOUNTER — Encounter: Payer: Self-pay | Admitting: Family Medicine

## 2020-03-12 ENCOUNTER — Other Ambulatory Visit: Payer: Self-pay | Admitting: Family Medicine

## 2020-03-13 MED ORDER — ESCITALOPRAM OXALATE 20 MG PO TABS: 20.0000 mg | ORAL_TABLET | Freq: Every day | ORAL | 0 refills | Status: DC

## 2020-03-14 ENCOUNTER — Other Ambulatory Visit: Payer: Self-pay

## 2020-03-14 ENCOUNTER — Ambulatory Visit (INDEPENDENT_AMBULATORY_CARE_PROVIDER_SITE_OTHER): Payer: BC Managed Care – PPO | Admitting: Family Medicine

## 2020-03-14 ENCOUNTER — Encounter: Payer: Self-pay | Admitting: Family Medicine

## 2020-03-14 VITALS — BP 125/80 | HR 70 | Temp 98.2°F | Ht 72.0 in | Wt 271.0 lb

## 2020-03-14 DIAGNOSIS — R5383 Other fatigue: Secondary | ICD-10-CM

## 2020-03-14 DIAGNOSIS — K219 Gastro-esophageal reflux disease without esophagitis: Secondary | ICD-10-CM

## 2020-03-14 DIAGNOSIS — C090 Malignant neoplasm of tonsillar fossa: Secondary | ICD-10-CM

## 2020-03-14 DIAGNOSIS — F331 Major depressive disorder, recurrent, moderate: Secondary | ICD-10-CM

## 2020-03-14 DIAGNOSIS — E559 Vitamin D deficiency, unspecified: Secondary | ICD-10-CM

## 2020-03-14 DIAGNOSIS — R6889 Other general symptoms and signs: Secondary | ICD-10-CM | POA: Diagnosis not present

## 2020-03-14 MED ORDER — ESCITALOPRAM OXALATE 20 MG PO TABS: 20.0000 mg | ORAL_TABLET | Freq: Every day | ORAL | 3 refills | Status: DC

## 2020-03-14 MED ORDER — LORAZEPAM 0.5 MG PO TABS: 0.5000 mg | ORAL_TABLET | Freq: Every day | ORAL | 1 refills | Status: DC | PRN

## 2020-03-14 MED ORDER — ESOMEPRAZOLE MAGNESIUM 20 MG PO CPDR: 20.0000 mg | DELAYED_RELEASE_CAPSULE | Freq: Every day | ORAL | 3 refills | Status: DC

## 2020-03-14 MED ORDER — VITAMIN D (ERGOCALCIFEROL) 1.25 MG (50000 UNIT) PO CAPS
ORAL_CAPSULE | ORAL | 3 refills | Status: DC
Start: 2020-03-14 — End: 2021-05-05

## 2020-03-14 NOTE — Progress Notes (Signed)
Subjective: CC: Depression, GERD, cancer PCP: [name], DO DXI:PJASNKN Eric Weaver is a 49 y.o. male presenting to clinic today for:  1.  Depression/anxiety, sleepiness, cancer Patient reports that he tried to self weaned from the Lexapro but ultimately found that he needed the medicine so he is back up to 20 mg daily over the last couple of days.  He does overall feel somewhat better.  He does continue to have anxiety and panic.  This seems to be precipitated by home stressors.  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  His last PET scan showed clearance.  He does report ongoing fatigue since the treatment but had had some fatigue prior to diagnosis.  Previously was snoring and pausing in breathing but his wife has not noticed this since his treatment of the cancer.  He does report excessive daytime sleepiness and he easily falls asleep when he sits down.  His sleep has not been good at nighttime but he notes that his 77-year-old child still sleeps in the bed with them.  2.  GERD Patient reports GERD symptoms are stable.  Needs refill Nexium  3.  Vitamin D deficiency Has not had vitamin D checked in a while.  Also needs vitamin D prescription renewed  ROS: Per HPI  No Known Allergies Past Medical History:  Diagnosis Date  . Depression   . Essential hypertension 08/02/2018  . GERD (gastroesophageal reflux disease)   . History of nephrolithiasis   . Vitamin D deficiency     Current Outpatient Medications:  .  benzonatate (TESSALON) 100 MG capsule, benzonatate 100 mg capsule  TAKE 1 2 CAPSULES (100 200 MG TOTAL) BY MOUTH 3 (THREE) TIMES DAILY., Disp: , Rfl:  .  escitalopram (LEXAPRO) 20 MG tablet, Take 1 tablet (20 mg total) by mouth daily. (Needs to be seen before next refill), Disp: 30 tablet, Rfl: 0 .  esomeprazole (NEXIUM) 20 MG capsule, Take 1 capsule (20 mg total) by mouth daily at 12 noon.,  Disp: 90 capsule, Rfl: 3 .  guaiFENesin (ROBITUSSIN) 100 MG/5ML liquid, Take 200 mg by mouth 3 (three) times daily as needed for cough., Disp: , Rfl:  .  HYDROcodone-acetaminophen (NORCO/VICODIN) 5-325 MG tablet, hydrocodone 5 mg-acetaminophen 325 mg tablet  TAKE 1 TO 2 TABLETS BY MOUTH EVERY 6 HOURS AS NEEDED FOR UP TO 5 DAYS FOR MODERATE PAIN, Disp: , Rfl:  .  ibuprofen (ADVIL) 200 MG tablet, Take 200 mg by mouth every 6 (six) hours as needed. Takes 3-4, Disp: , Rfl:  .  lidocaine (XYLOCAINE) 2 % solution, Patient: Mix 1part 2% viscous lidocaine, 1part H20. Swish & swallow 58mL of diluted mixture, 50min before meals and at bedtime, up to QID (Patient not taking: Reported on 09/14/2019), Disp: 200 mL, Rfl: 4 .  lidocaine-prilocaine (EMLA) cream, Apply 1 application topically as needed. (Patient not taking: Reported on 10/27/2019), Disp: 30 g, Rfl: 2 .  ondansetron (ZOFRAN) 8 MG tablet, Take 1 tablet (8 mg total) by mouth every 8 (eight) hours as needed for nausea or vomiting. (Patient not taking: Reported on 09/14/2019), Disp: 30 tablet, Rfl: 1 .  oxyCODONE (OXY IR/ROXICODONE) 5 MG immediate release tablet, Take 1 tablet (5 mg total) by mouth every 4 (four) hours as needed for severe pain. (Patient not taking: Reported on 10/27/2019), Disp: 60 tablet, Rfl: 0 .  prochlorperazine (COMPAZINE) 10 MG tablet, Take 1 tablet (10 mg total) by mouth  every 6 (six) hours as needed for nausea or vomiting. (Patient not taking: Reported on 09/14/2019), Disp: 30 tablet, Rfl: 1 .  senna-docusate (SENOKOT-S) 8.6-50 MG tablet, Take 1 tablet by mouth 2 (two) times daily as needed for mild constipation. (Patient not taking: Reported on 09/14/2019), Disp: 30 tablet, Rfl: 3 .  sodium fluoride (PREVIDENT 5000 PLUS) 1.1 % CREA dental cream, Apply to tooth brush. Brush teeth for 2 minutes. Spit out excess. So NOT swallow. Repeat nightly. (Patient not taking: Reported on 09/14/2019), Disp: 1 Tube, Rfl: prn .  sucralfate (CARAFATE) 1 g  tablet, Take 1 tablet (1 g total) by mouth 4 (four) times daily -  with meals and at bedtime. (Patient not taking: Reported on 09/14/2019), Disp: 120 tablet, Rfl: 1 .  tadalafil (CIALIS) 5 MG tablet, Take 5 mg by mouth daily as needed., Disp: , Rfl:  .  Vitamin D, Ergocalciferol, (DRISDOL) 1.25 MG (50000 UT) CAPS capsule, TAKE 1 CAPSULE BY MOUTH WEEKLY (Patient not taking: Reported on 10/27/2019), Disp: 12 capsule, Rfl: 1 Social History   Socioeconomic History  . Marital status: Married    Spouse name: Not on file  . Number of children: Not on file  . Years of education: Not on file  . Highest education level: Not on file  Occupational History  . Not on file  Tobacco Use  . Smoking status: Never Smoker  . Smokeless tobacco: Never Used  Vaping Use  . Vaping Use: Never used  Substance and Sexual Activity  . Alcohol use: Yes    Comment: occasional  . Drug use: No  . Sexual activity: Not Currently  Other Topics Concern  . Not on file  Social History Narrative  . Not on file   Social Determinants of Health   Financial Resource Strain:   . Difficulty of Paying Living Expenses: Not on file  Food Insecurity:   . Worried About Charity fundraiser in the Last Year: Not on file  . Ran Out of Food in the Last Year: Not on file  Transportation Needs:   . Lack of Transportation (Medical): Not on file  . Lack of Transportation (Non-Medical): Not on file  Physical Activity:   . Days of Exercise per Week: Not on file  . Minutes of Exercise per Session: Not on file  Stress:   . Feeling of Stress : Not on file  Social Connections:   . Frequency of Communication with Friends and Family: Not on file  . Frequency of Social Gatherings with Friends and Family: Not on file  . Attends Religious Services: Not on file  . Active Member of Clubs or Organizations: Not on file  . Attends Archivist Meetings: Not on file  . Marital Status: Not on file  Intimate Partner Violence:   . Fear of  Current or Ex-Partner: Not on file  . Emotionally Abused: Not on file  . Physically Abused: Not on file  . Sexually Abused: Not on file   Family History  Problem Relation Age of Onset  . Hypertension Mother   . Heart attack Father     Objective: Office vital signs reviewed. BP 125/80   Pulse 70   Temp 98.2 F (36.8 C)   Ht 6' (1.829 m)   Wt 271 lb (122.9 kg)   SpO2 96%   BMI 36.75 kg/m   Physical Examination:  General: Awake, alert, well nourished, No acute distress HEENT: Normal; oropharynx without masses, erythema Cardio: regular rate and rhythm,  S1S2 heard, no murmurs appreciated Pulm: clear to auscultation bilaterally, no wheezes, rhonchi or rales; normal work of breathing on room air Skin: dry; intact; no rashes or lesions Psych: Mood stable, speech normal, affect appropriate, pleasant and interactive  Assessment/ Plan: 49 y.o. male   1. Moderate episode of recurrent major depressive disorder ([place]) Mildly uncontrolled but may be because he needs to go back up to 20 mg for more than 2 days.  Resume 20 mg daily.  I have given him a small quantity of Ativan to have on hand as needed panic.  Caution sedation.  Caution dependence.  Plan for UDS and CSC at next visit if he ends up staying on this medicine. - escitalopram (LEXAPRO) 20 MG tablet; Take 1 tablet (20 mg total) by mouth daily.  Dispense: 90 tablet; Refill: 3 - LORazepam (ATIVAN) 0.5 MG tablet; Take 1 tablet (0.5 mg total) by mouth daily as needed for anxiety (panic).  Dispense: 30 tablet; Refill: 1  2. Carcinoma of tonsillar fossa ([place]) In remission.  3. Vitamin D deficiency Check vitamin D level.  Vitamin D renewed - VITAMIN D 25 Hydroxy (Vit-D Deficiency, Fractures)  4. Gastroesophageal reflux disease without esophagitis Stable - esomeprazole (NEXIUM) 20 MG capsule; Take 1 capsule (20 mg total) by mouth daily at 12 noon.  Dispense: 90 capsule; Refill: 3  5. Fatigue, unspecified type Possibly due to  undiagnosed sleep apnea versus post radiation/chemotherapy treatment.  I offered referral to sleep medicine but he would like to hold off on this for now - Vitamin B12 - Thyroid Panel With TSH - CBC  The Narcotic Database has been reviewed.  There were no red flags.     Orders Placed This Encounter  Procedures  . VITAMIN D 25 Hydroxy (Vit-D Deficiency, Fractures)  . Vitamin B12  . Thyroid Panel With TSH  . CBC   Meds ordered this encounter  Medications  . escitalopram (LEXAPRO) 20 MG tablet    Sig: Take 1 tablet (20 mg total) by mouth daily.    Dispense:  90 tablet    Refill:  3  . esomeprazole (NEXIUM) 20 MG capsule    Sig: Take 1 capsule (20 mg total) by mouth daily at 12 noon.    Dispense:  90 capsule    Refill:  3  . LORazepam (ATIVAN) 0.5 MG tablet    Sig: Take 1 tablet (0.5 mg total) by mouth daily as needed for anxiety (panic).    Dispense:  30 tablet    Refill:  1  . Vitamin D, Ergocalciferol, (DRISDOL) 1.25 MG (50000 UNIT) CAPS capsule    Sig: TAKE 1 CAPSULE BY MOUTH WEEKLY    Dispense:  12 capsule    Refill:  [place], [place] [phone]

## 2020-03-14 NOTE — Patient Instructions (Addendum)
Let me know if you want to do the sleep test.  I think it would be helpful   You had labs performed today.  You will be contacted with the results of the labs once they are available, usually in the next 3 business days for routine lab work.  If you have an active my chart account, they will be released to your MyChart.  If you prefer to have these labs released to you via telephone, please let us know.  If you had a pap smear or biopsy performed, expect to be contacted in about 7-10 days.   Controlled Substance Guidelines:  1. You cannot get an early refill, even it is lost.  2. You cannot get controlled medications from any other doctor, unless it is the emergency department and related to a new problem or injury.  3. You cannot use alcohol, marijuana, cocaine or any other recreational drugs while using this medication. This is very dangerous.  4. You are willing to have your urine drug tested at each visit.  5. You will not drive while using this medication, because that can put yourself and others in serious danger of an accident. 6. If any medication is stolen, then there must be a police report to verify it, or it cannot be refilled.  7. I will not prescribe these medications for longer than 3 months.  8. You must bring your pill bottle to each visit.  9. You must use the same pharmacy for all refills for the medication, unless you clear it with me beforehand.  10. You cannot share or sell this medication.

## 2020-03-15 LAB — CBC
Hematocrit: 42.9 % (ref 37.5–51.0)
Hemoglobin: 14.6 g/dL (ref 13.0–17.7)
MCH: 31.3 pg (ref 26.6–33.0)
MCHC: 34 g/dL (ref 31.5–35.7)
MCV: 92 fL (ref 79–97)
Platelets: 270 10*3/uL (ref 150–450)
RBC: 4.67 x10E6/uL (ref 4.14–5.80)
RDW: 13.6 % (ref 11.6–15.4)
WBC: 6.1 10*3/uL (ref 3.4–10.8)

## 2020-03-15 LAB — THYROID PANEL WITH TSH
Free Thyroxine Index: 1.5 (ref 1.2–4.9)
T3 Uptake Ratio: 23 % — ABNORMAL LOW (ref 24–39)
T4, Total: 6.7 ug/dL (ref 4.5–12.0)
TSH: 1.69 u[IU]/mL (ref 0.450–4.500)

## 2020-03-15 LAB — VITAMIN D 25 HYDROXY (VIT D DEFICIENCY, FRACTURES): Vit D, 25-Hydroxy: 18.8 ng/mL — ABNORMAL LOW (ref 30.0–100.0)

## 2020-03-15 LAB — VITAMIN B12: Vitamin B-12: 659 pg/mL (ref 232–1245)

## 2020-03-21 DIAGNOSIS — Z923 Personal history of irradiation: Secondary | ICD-10-CM | POA: Diagnosis not present

## 2020-05-02 ENCOUNTER — Encounter: Payer: Self-pay | Admitting: Family Medicine

## 2020-05-10 ENCOUNTER — Inpatient Hospital Stay: Payer: BC Managed Care – PPO | Admitting: Hematology and Oncology

## 2020-05-10 ENCOUNTER — Inpatient Hospital Stay: Payer: BC Managed Care – PPO

## 2020-06-03 ENCOUNTER — Other Ambulatory Visit: Payer: Self-pay | Admitting: Family Medicine

## 2020-06-03 MED ORDER — TADALAFIL 5 MG PO TABS
5.0000 mg | ORAL_TABLET | Freq: Every day | ORAL | 3 refills | Status: DC
Start: 2020-06-03 — End: 2021-10-07

## 2020-06-26 ENCOUNTER — Telehealth: Payer: Self-pay | Admitting: Hematology and Oncology

## 2020-06-26 NOTE — Telephone Encounter (Signed)
Called and scheduled appt per 1/24 sch msg - unable to reach pt . Left message for patient with appt date and time

## 2020-07-10 ENCOUNTER — Telehealth: Payer: Self-pay | Admitting: Hematology and Oncology

## 2020-07-10 NOTE — Telephone Encounter (Signed)
Called pt per 2/8 sch msg - no answer. Left message for patient to call back to reschedule appt. No later time available on the current appt date.

## 2020-07-15 ENCOUNTER — Encounter: Payer: Self-pay | Admitting: Family Medicine

## 2020-07-15 ENCOUNTER — Ambulatory Visit (INDEPENDENT_AMBULATORY_CARE_PROVIDER_SITE_OTHER): Payer: 59 | Admitting: Family Medicine

## 2020-07-15 ENCOUNTER — Other Ambulatory Visit: Payer: Self-pay

## 2020-07-15 VITALS — BP 127/80 | HR 68 | Temp 97.4°F | Ht 72.0 in | Wt 284.0 lb

## 2020-07-15 DIAGNOSIS — K219 Gastro-esophageal reflux disease without esophagitis: Secondary | ICD-10-CM

## 2020-07-15 DIAGNOSIS — Z23 Encounter for immunization: Secondary | ICD-10-CM

## 2020-07-15 DIAGNOSIS — Z923 Personal history of irradiation: Secondary | ICD-10-CM | POA: Insufficient documentation

## 2020-07-15 DIAGNOSIS — F331 Major depressive disorder, recurrent, moderate: Secondary | ICD-10-CM | POA: Diagnosis not present

## 2020-07-15 DIAGNOSIS — F411 Generalized anxiety disorder: Secondary | ICD-10-CM | POA: Diagnosis not present

## 2020-07-15 DIAGNOSIS — Z85818 Personal history of malignant neoplasm of other sites of lip, oral cavity, and pharynx: Secondary | ICD-10-CM | POA: Diagnosis not present

## 2020-07-15 DIAGNOSIS — F410 Panic disorder [episodic paroxysmal anxiety] without agoraphobia: Secondary | ICD-10-CM

## 2020-07-15 MED ORDER — LORAZEPAM 0.5 MG PO TABS: 0.5000 mg | ORAL_TABLET | Freq: Every day | ORAL | 5 refills | Status: DC | PRN

## 2020-07-15 NOTE — Patient Instructions (Signed)
Increase hydration.  I think this might help with the soft palate irritation you describe.   Ativan (like Klonopin) can cause hallucinations, falls, breathing problems, dementia and even death.  Use medication ONLY if needed.  Controlled Substance Guidelines:  1. You cannot get an early refill, even it is lost.  2. You cannot get controlled medications from any other doctor, unless it is the emergency department and related to a new problem or injury.  3. You cannot use alcohol, marijuana, cocaine or any other recreational drugs while using this medication. This is very dangerous.  4. You are willing to have your urine drug tested at each visit.  5. You will not drive while using this medication, because that can put yourself and others in serious danger of an accident. 6. If any medication is stolen, then there must be a police report to verify it, or it cannot be refilled.  7. I will not prescribe these medications for longer than 3 months.  8. You must bring your pill bottle to each visit.  9. You must use the same pharmacy for all refills for the medication, unless you clear it with me beforehand.  10. You cannot share or sell this medication.

## 2020-07-15 NOTE — Progress Notes (Signed)
Subjective: CC: Follow-up anxiety depression PCP: [name], DO POE:UMPNTIR Yom is a 50 y.o. male presenting to clinic today for:  1.  Anxiety and depression Patient reports compliance with Lexapro.  He continues to use Ativan intermittently.  He does not take it as much as he "thinks he needs it".  He does admit that the Klonopin seem to work a little bit better but the Ativan is effective.  He continues to have stressors at home, citing that he has had some difficulties with [employer] and his older 2 children.  His work life seems to be doing pretty good though.  No reports of visual auditory hallucinations, depressed breathing or falls with the Ativan.  2.  GERD Patient reports that he has quite a bit of difficulty with GERD.  He uses the Nexium but it does not always fully control his symptoms, particularly if he eats certain foods.  He has been on Prilosec in the past but did not find that to be helpful.  The best thing that he has been on is Nexium and is currently on 20 mg of this.  No GI bleeding or vomiting reported  3.  History of tonsillar cancer status post radiation He continues to have some difficulty with a soft palate excretions.  He on a daily basis will cough up hard collection of phlegm.  He talked to his ENT about this but told that that was a normal side effect of his radiation therapy.  He tried doing sinus rinses but did not find them especially helpful.  Not currently using any nasal steroids or alternatives.  ROS: Per HPI  No Known Allergies Past Medical History:  Diagnosis Date  . Depression   . Essential hypertension 08/02/2018  . GERD (gastroesophageal reflux disease)   . History of nephrolithiasis   . Vitamin D deficiency     Current Outpatient Medications:  .  escitalopram (LEXAPRO) 20 MG tablet, Take 1 tablet (20 mg total) by mouth daily., Disp: 90 tablet, Rfl: 3 .  esomeprazole (NEXIUM) 20 MG capsule, Take 1 capsule (20 mg total) by mouth  daily at 12 noon., Disp: 90 capsule, Rfl: 3 .  HYDROcodone-acetaminophen (NORCO/VICODIN) 5-325 MG tablet, hydrocodone 5 mg-acetaminophen 325 mg tablet  TAKE 1 TO 2 TABLETS BY MOUTH EVERY 6 HOURS AS NEEDED FOR UP TO 5 DAYS FOR MODERATE PAIN, Disp: , Rfl:  .  LORazepam (ATIVAN) 0.5 MG tablet, Take 1 tablet (0.5 mg total) by mouth daily as needed for anxiety (panic)., Disp: 30 tablet, Rfl: 1 .  tadalafil (CIALIS) 5 MG tablet, Take 1 tablet (5 mg total) by mouth daily., Disp: 90 tablet, Rfl: 3 .  Vitamin D, Ergocalciferol, (DRISDOL) 1.25 MG (50000 UNIT) CAPS capsule, TAKE 1 CAPSULE BY MOUTH WEEKLY, Disp: 12 capsule, Rfl: 3 Social History   Socioeconomic History  . Marital status: Married    Spouse name: Not on file  . Number of children: Not on file  . Years of education: Not on file  . Highest education level: Not on file  Occupational History  . Not on file  Tobacco Use  . Smoking status: Never Smoker  . Smokeless tobacco: Never Used  Vaping Use  . Vaping Use: Never used  Substance and Sexual Activity  . Alcohol use: Yes    Comment: occasional  . Drug use: No  . Sexual activity: Not Currently  Other Topics Concern  . Not on file  Social History Narrative  . Not on file  Social Determinants of Health   Financial Resource Strain: Not on file  Food Insecurity: Not on file  Transportation Needs: Not on file  Physical Activity: Not on file  Stress: Not on file  Social Connections: Not on file  Intimate Partner Violence: Not on file   Family History  Problem Relation Age of Onset  . Hypertension Mother   . Heart attack Father     Objective: Office vital signs reviewed. BP 127/80   Pulse 68   Temp (!) 97.4 F (36.3 C) (Temporal)   Ht 6' (1.829 m)   Wt 284 lb (128.8 kg)   SpO2 99%   BMI 38.52 kg/m   Physical Examination:  General: Awake, alert, well nourished, No acute distress MSK: normal gait and station Psych: Mood stable, speech normal.  Good eye  contact  Depression screen [facility] 2/9 03/14/2020 09/13/2018 08/02/2018  Decreased Interest 2 1 0  Down, Depressed, Hopeless 1 0 1  PHQ - 2 Score 3 1 1   Altered sleeping 1 1 0  Tired, decreased energy 1 1 0  Change in appetite 0 0 0  Feeling bad or failure about yourself  0 - 0  Trouble concentrating 1 0 0  Moving slowly or fidgety/restless 0 0 0  Suicidal thoughts 0 0 0  PHQ-9 Score 6 3 1   Difficult doing work/chores Not difficult at all - -   GAD 7 : Generalized Anxiety Score 07/15/2020 03/14/2020 09/13/2018  Nervous, Anxious, on Edge 2 1 1   Control/stop worrying 0 1 0  Worry too much - different things 0 1 0  Trouble relaxing 0 1 0  Restless 0 0 0  Easily annoyed or irritable 2 2 1   Afraid - awful might happen 0 0 0  Total GAD 7 Score 4 6 2   Anxiety Difficulty Somewhat difficult Not difficult at all Not difficult at all     Assessment/ Plan: 50 y.o. male   Moderate episode of recurrent major depressive disorder ([place]) - Plan: LORazepam (ATIVAN) 0.5 MG tablet  Generalized anxiety disorder with panic attacks - Plan: ToxASSURE Select 13 (MW), Urine  History of cancer tonsil  History of radiation to head and neck region  Gastroesophageal reflux disease without esophagitis  Depression is stable.  Anxiety mild.  Okay to continue Ativan as needed.  Use judiciously.  The national narcotic database was reviewed and there were no red flags.  CSC and UDS were obtained as per office policy.  Continue Lexapro.  Follow-up in 6 months  Advised to increase water consumption.  I wonder if this would help the collection of hard phlegm he is developing  Increase Nexium to 40 mg daily.  He will contact me in the next couple weeks to let me know if this is working well.  If so I will adjust his prescription  No orders of the defined types were placed in this encounter.  No orders of the defined types were placed in this encounter.    [name], DO [place] [phone]

## 2020-07-21 LAB — TOXASSURE SELECT 13 (MW), URINE

## 2020-07-23 IMAGING — CT NM PET TUM IMG INITIAL (PI) SKULL BASE T - THIGH
1 of 7 series · 2 of 25 positions shown · non-contrast
Comparison: Neck CT 04/23/2019

CLINICAL DATA: Initial treatment strategy for head neck carcinoma.
Tonsillar biopsy 04/24/2019

EXAM:
NUCLEAR MEDICINE PET SKULL BASE TO THIGH
TECHNIQUE: 14.2 mCi F-18 FDG was injected intravenously. Full-ring PET imaging
was performed from the skull base to thigh after the radiotracer. CT
data was obtained and used for attenuation correction and anatomic
localization.
Fasting blood glucose: 103 mg/dl

[Series 4: ct hn_sk_th 5.0 b31f · axial · 5.0mm · 0.98mm/px · z∈[-430,-174]mm · 2 of 256 slices shown]
[im 192/256  brain]
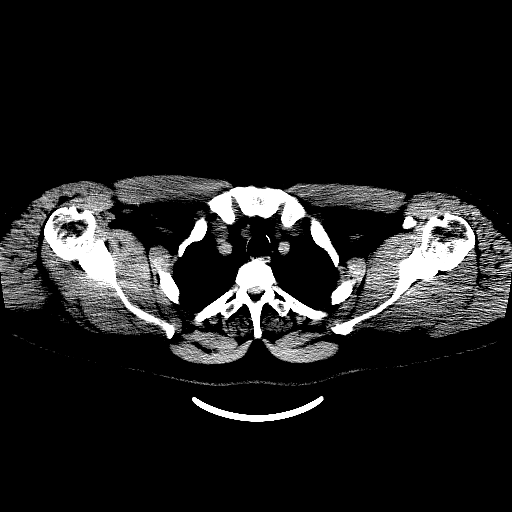
[im 256/256  brain]
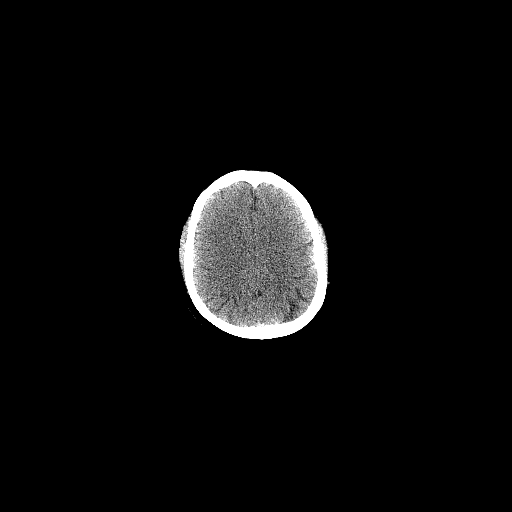

[2 of 25 positions shown; findings below may reference images not displayed]

FINDINGS: Mediastinal blood pool activity: SUV max

Liver activity: SUV max NA

NECK: Large hypermetabolic mass centered at the RIGHT palatine
tonsil measuring 3.9 x 3.4 cm (image 33/) with intense metabolic
activity (SUV max 26.7).

There is moderate hypermetabolic activity the LEFT palatine tonsil
region with SUV max equal 8.2. No mass lesion identified.

Hypermetabolic RIGHT level 2 lymph node along the anterior margin
the sternocleidomastoid muscle measures 17 mm (image 38/) SUV max
11.2. Slightly more inferior, below the level of the hyoid bone, 10
mm RIGHT level 3 lymph node (image [DATE]) is hypermetabolic SUV max
9.2.

Within the LEFT neck, single small lymph node LEFT level 2 node
measuring 7 mm short axis image 35/4 has moderate metabolic activity
for size (SUV max equal 2.8).

Incidental CT findings: none

CHEST: No hypermetabolic mediastinal or hilar nodes. No suspicious
pulmonary nodules on the CT scan.

Incidental CT findings: none

ABDOMEN/PELVIS: No abnormal hypermetabolic activity within the
liver, pancreas, adrenal glands, or spleen. No hypermetabolic lymph
nodes in the abdomen or pelvis.

Incidental CT findings: Low-attenuation liver consistent hepatic
steatosis.

SKELETON:

Incidental CT findings: none
IMPRESSION: 1. Large hypermetabolic mass centered in the RIGHT palatine tonsil
consistent with primary head neck cancer.
2. Hypermetabolic nodal metastasis to a RIGHT level 2 and level 3
lymph node.
3. Indeterminate midline crossing of carcinoma with hypermetabolic
tissue at the LEFT tonsil without clear lesion on CT.
4. Mildly hypermetabolic lymph node in the LEFT level 2 neck, cannot
exclude nodal metastasis. Correlate with findings at the LEFT
palatine tonsil.

## 2020-07-29 ENCOUNTER — Other Ambulatory Visit: Payer: Self-pay

## 2020-07-29 ENCOUNTER — Other Ambulatory Visit: Payer: BC Managed Care – PPO

## 2020-07-29 ENCOUNTER — Ambulatory Visit: Payer: BC Managed Care – PPO | Admitting: Hematology and Oncology

## 2020-08-08 ENCOUNTER — Inpatient Hospital Stay: Payer: 59 | Attending: Hematology and Oncology

## 2020-08-08 ENCOUNTER — Other Ambulatory Visit: Payer: Self-pay | Admitting: Hematology and Oncology

## 2020-08-08 ENCOUNTER — Other Ambulatory Visit: Payer: Self-pay

## 2020-08-08 ENCOUNTER — Inpatient Hospital Stay (HOSPITAL_BASED_OUTPATIENT_CLINIC_OR_DEPARTMENT_OTHER): Payer: 59 | Admitting: Hematology and Oncology

## 2020-08-08 VITALS — BP 118/95 | HR 98 | Temp 97.9°F | Resp 16 | Ht 72.0 in | Wt 283.2 lb

## 2020-08-08 DIAGNOSIS — E559 Vitamin D deficiency, unspecified: Secondary | ICD-10-CM | POA: Diagnosis not present

## 2020-08-08 DIAGNOSIS — K219 Gastro-esophageal reflux disease without esophagitis: Secondary | ICD-10-CM | POA: Diagnosis not present

## 2020-08-08 DIAGNOSIS — C090 Malignant neoplasm of tonsillar fossa: Secondary | ICD-10-CM | POA: Diagnosis not present

## 2020-08-08 DIAGNOSIS — Z87442 Personal history of urinary calculi: Secondary | ICD-10-CM | POA: Insufficient documentation

## 2020-08-08 DIAGNOSIS — Z79899 Other long term (current) drug therapy: Secondary | ICD-10-CM | POA: Insufficient documentation

## 2020-08-08 DIAGNOSIS — Z923 Personal history of irradiation: Secondary | ICD-10-CM | POA: Diagnosis not present

## 2020-08-08 DIAGNOSIS — Z9221 Personal history of antineoplastic chemotherapy: Secondary | ICD-10-CM | POA: Insufficient documentation

## 2020-08-08 DIAGNOSIS — Z95828 Presence of other vascular implants and grafts: Secondary | ICD-10-CM | POA: Diagnosis not present

## 2020-08-08 DIAGNOSIS — I10 Essential (primary) hypertension: Secondary | ICD-10-CM | POA: Diagnosis not present

## 2020-08-08 DIAGNOSIS — Z85819 Personal history of malignant neoplasm of unspecified site of lip, oral cavity, and pharynx: Secondary | ICD-10-CM | POA: Insufficient documentation

## 2020-08-08 LAB — CBC WITH DIFFERENTIAL (CANCER CENTER ONLY)
Abs Immature Granulocytes: 0.01 10*3/uL (ref 0.00–0.07)
Basophils Absolute: 0 10*3/uL (ref 0.0–0.1)
Basophils Relative: 0 %
Eosinophils Absolute: 0.1 10*3/uL (ref 0.0–0.5)
Eosinophils Relative: 1 %
HCT: 42.8 % (ref 39.0–52.0)
Hemoglobin: 14.9 g/dL (ref 13.0–17.0)
Immature Granulocytes: 0 %
Lymphocytes Relative: 22 %
Lymphs Abs: 1.3 10*3/uL (ref 0.7–4.0)
MCH: 30.1 pg (ref 26.0–34.0)
MCHC: 34.8 g/dL (ref 30.0–36.0)
MCV: 86.5 fL (ref 80.0–100.0)
Monocytes Absolute: 0.4 10*3/uL (ref 0.1–1.0)
Monocytes Relative: 7 %
Neutro Abs: 4.1 10*3/uL (ref 1.7–7.7)
Neutrophils Relative %: 70 %
Platelet Count: 250 10*3/uL (ref 150–400)
RBC: 4.95 MIL/uL (ref 4.22–5.81)
RDW: 13.2 % (ref 11.5–15.5)
WBC Count: 5.8 10*3/uL (ref 4.0–10.5)
nRBC: 0 % (ref 0.0–0.2)

## 2020-08-08 LAB — CMP (CANCER CENTER ONLY)
ALT: 81 U/L — ABNORMAL HIGH (ref 0–44)
AST: 49 U/L — ABNORMAL HIGH (ref 15–41)
Albumin: 4.5 g/dL (ref 3.5–5.0)
Alkaline Phosphatase: 64 U/L (ref 38–126)
Anion gap: 7 (ref 5–15)
BUN: 13 mg/dL (ref 6–20)
CO2: 23 mmol/L (ref 22–32)
Calcium: 9.4 mg/dL (ref 8.9–10.3)
Chloride: 107 mmol/L (ref 98–111)
Creatinine: 1.3 mg/dL — ABNORMAL HIGH (ref 0.61–1.24)
GFR, Estimated: >60 mL/min (ref >60)
Glucose, Bld: 92 mg/dL (ref 70–99)
Potassium: 4 mmol/L (ref 3.5–5.1)
Sodium: 137 mmol/L (ref 135–145)
Total Bilirubin: 0.5 mg/dL (ref 0.3–1.2)
Total Protein: 8.3 g/dL — ABNORMAL HIGH (ref 6.5–8.1)

## 2020-08-09 LAB — TSH: TSH: 2.966 u[IU]/mL (ref 0.320–4.118)

## 2020-08-09 NOTE — Progress Notes (Signed)
Oncology Nurse Navigator Documentation  At the request of Dr. Lorenso Courier I have called and scheduled Mr. Eric Weaver to see Dr. Constance Holster on 3/24 at 4:00 for tongue pain. Mr. Eric Weaver is aware of the appointment and agreeable to the date and time. He knows to call me if they have any further questions or concerns.   Harlow Asa RN, BSN, OCN Head & Neck Oncology Nurse [place] at [facility] Phone # [phone]  Fax # [phone]

## 2020-08-12 ENCOUNTER — Encounter: Payer: Self-pay | Admitting: Hematology and Oncology

## 2020-08-12 IMAGING — XA IR IMAGING GUIDED PORT INSERTION
1 series · 1 of 1 positions shown · non-contrast
Comparison: none

CLINICAL DATA: Tonsillar squamous carcinoma and need for porta cath
for chemotherapy.

[Series 300: ir perc tun perit cath w/port s&i /imag · 1 of 1 slices shown]
[im 1/1]
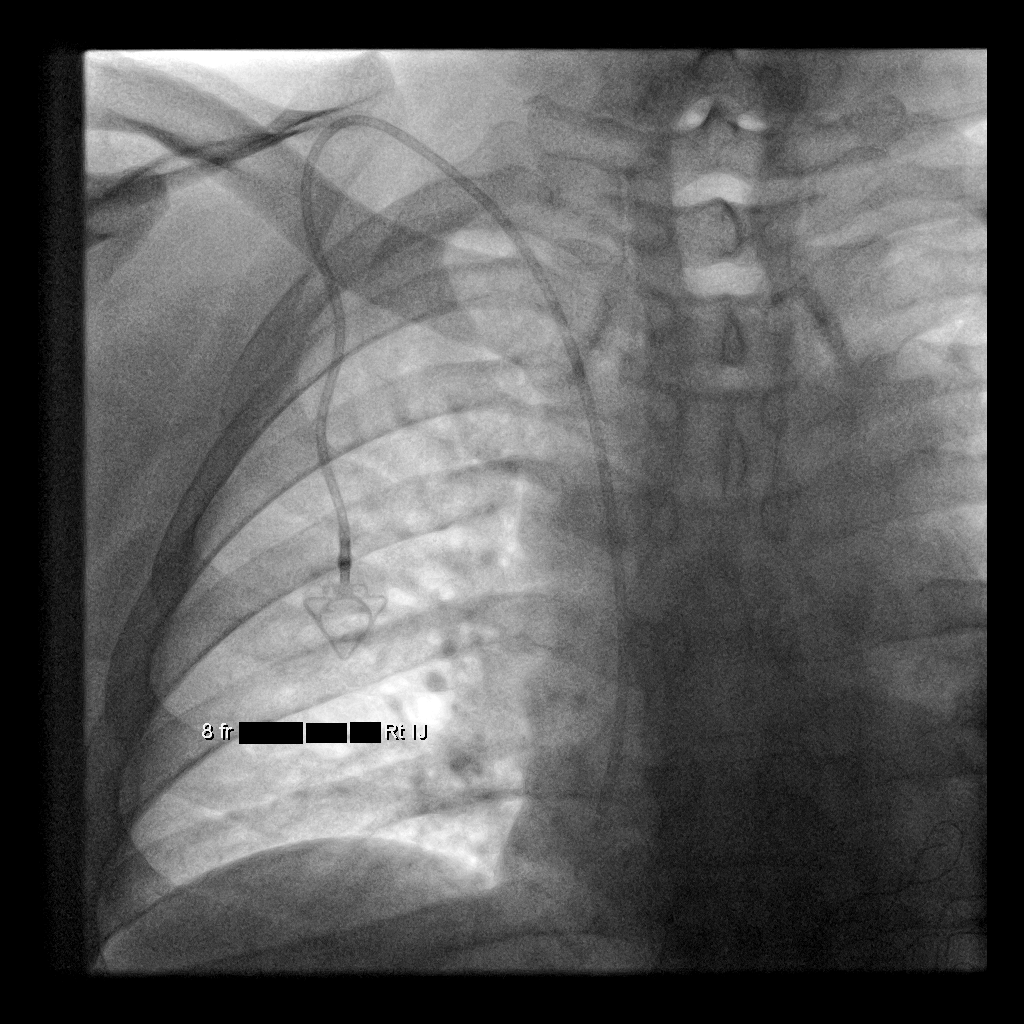

[1 of 1 positions shown; findings below may reference images not displayed]

EXAM:
IMPLANTED PORT A CATH PLACEMENT WITH ULTRASOUND AND FLUOROSCOPIC
GUIDANCE

ANESTHESIA/SEDATION:
5.0 mg IV Versed; 150 mcg IV Fentanyl

Total Moderate Sedation Time:  37 minutes

The patient's level of consciousness and physiologic status were
continuously monitored during the procedure by Radiology nursing.

Additional Medications: 2 g IV Ancef.

FLUOROSCOPY TIME:  30 seconds.  15.8 mGy.

PROCEDURE:
The procedure, risks, benefits, and alternatives were explained to
the patient. Questions regarding the procedure were encouraged and
answered. The patient understands and consents to the procedure. A
time-out was performed prior to initiating the procedure.

Ultrasound was utilized to confirm patency of the right internal
jugular vein. The right neck and chest were prepped with
chlorhexidine in a sterile fashion, and a sterile drape was applied
covering the operative field. Maximum barrier sterile technique with
sterile gowns and gloves were used for the procedure. Local
anesthesia was provided with 1% lidocaine.

After creating a small venotomy incision, a 21 gauge needle was
advanced into the right internal jugular vein under direct,
real-time ultrasound guidance. Ultrasound image documentation was
performed. After securing guidewire access, an 8 Fr dilator was
placed. A J-wire was kinked to measure appropriate catheter length.

A subcutaneous port pocket was then created along the upper chest
wall utilizing sharp and blunt dissection. Portable cautery was
utilized. The pocket was irrigated with sterile saline.

A single lumen power injectable port was chosen for placement. The 8
Fr catheter was tunneled from the port pocket site to the venotomy
incision. The port was placed in the pocket. External catheter was
trimmed to appropriate length based on guidewire measurement.

At the venotomy, an 8 Fr peel-away sheath was placed over a
guidewire. The catheter was then placed through the sheath and the
sheath removed. Final catheter positioning was confirmed and
documented with a fluoroscopic spot image. The port was accessed
with a needle and aspirated and flushed with heparinized saline. The
access needle was removed.

The venotomy and port pocket incisions were closed with subcutaneous
3-0 Monocryl and subcuticular 4-0 Vicryl. Dermabond was applied to
both incisions.

COMPLICATIONS:
COMPLICATIONS
None
FINDINGS: After catheter placement, the tip lies at the Sinchi junction.
The catheter aspirates normally and is ready for immediate use.
IMPRESSION: Placement of single lumen port a cath via right internal jugular
vein. The catheter tip lies at the Sinchi junction. A power
injectable port a cath was placed and is ready for immediate use.

## 2020-08-12 NOTE — Progress Notes (Signed)
[place] [phone]   [phone]  PROGRESS NOTE  Patient Care Team: [name], DO as PCP - General (Family Medicine) Izora Gala, MD as Consulting Physician (Otolaryngology) Eppie Gibson, MD as Attending Physician (Radiation Oncology) Leota Sauers, RN (Inactive) as Registered Nurse Orson Slick, MD as Consulting Physician (Hematology and Oncology) Sharen Counter, CCC-SLP as Speech Language Pathologist (Speech Pathology) Karie Mainland, RD as Dietitian (Nutrition) Wynelle Beckmann, Melodie Bouillon, PT as Physical Therapist (Physical Therapy) Kennith Center, LCSW as Social Worker Malmfelt, Stephani Police, RN as Oncology Nurse Navigator (Oncology)  Hematological/Oncological History # 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 CT scan performed showing 4.1 x 4.0 x 5.3 cm mass centered at the right palatine tonsil, highly concerning for primary head and neck malignancy, likely squamous cell carcinoma, additionally there were partially necrotic right level II and III adenopathy. 2) 04/26/2019: referred to ENT, underwent right tonsillar biopsy confirming invasive squamous cell cancer 3) 05/17/2019: PET CT scan showed hypermetabolic mass at right palantine tonsil and hypermetabolic right level 2 and 3 lymph nodes. There was also mildly hypermetabolic lymph node in the left level 2 neck.  4) 05/24/2019: establish care with Dr. Lorenso Courier  5) 06/07/2019: started radiation therapy with Dr. Isidore Moos 6) 06/16/2019: Started Cisplatin 40mg /m2 with concurrent radiation 7) 06/23/2019: Week 2 cisplatin 40mg /m2 with concurrent radiation 9) 06/30/2019: Week 3 cisplatin 40mg /m2 with concurrent radiation 10) 07/07/2019: Week 4 cisplatin 40mg /m2 with concurrent radiation 11) 07/14/2019: Week 5 cisplatin 40mg /m2 with concurrent radiation 12) 07/21/2019: Week 6 cisplatin 40mg /m2 with  concurrent radiation 13) 07/25/2019: Completion of Chemoradiation.  14) 10/16/2019:  PET CT showed complete metabolic response to treatment with no residual tumor or adenopathy  Interval History:  Michial Disney 50 y.o. male with medical history significant for carcinoma of the tonsillar fossa  presents for a follow up visit. The patient's last visit was on 11/09/2019. In the interim he was briefly lost to follow up.   On exam today Mr. Mulvey notes that he has been well overall.  He does have some trouble with his tongue, particularly with hot or spicy foods.  He reports that [place] food and salsa cause a burning sensation on his tongue.  He reports that he typically still drinks water with everything and that most other food types are okay.  He has had good appetite and denies having any issues with pain in his throat.  He does still have some occasional thick saliva.  He was lost to follow-up twice briefly and is only had 1 fiberoptic exam in the interim.  He has otherwise been well with no issues of fevers, chills, sweats, nausea, vomiting or diarrhea.  A full 10 point ROS is listed below.  The only other request the patient had was for his port to be removed today.  MEDICAL HISTORY:  Past Medical History:  Diagnosis Date  . Depression   . Essential hypertension 08/02/2018  . GERD (gastroesophageal reflux disease)   . History of nephrolithiasis   . Vitamin D deficiency     SURGICAL HISTORY: Past Surgical History:  Procedure Laterality Date  . Biopsy of right tonsil Right 04/24/2019   Positive for squamous cell carcinoma  . IR GASTROSTOMY TUBE MOD SED  06/06/2019  . IR GASTROSTOMY TUBE REMOVAL  10/06/2019  . IR IMAGING GUIDED PORT INSERTION  06/06/2019  . IR REMOVAL TUN ACCESS W/  PORT W/O FL MOD SED  11/24/2019    ALLERGIES:  has No Known Allergies.  MEDICATIONS:  Current Outpatient Medications  Medication Sig Dispense Refill  . escitalopram (LEXAPRO) 20 MG tablet Take 1 tablet (20 mg  total) by mouth daily. 90 tablet 3  . esomeprazole (NEXIUM) 20 MG capsule Take 1 capsule (20 mg total) by mouth daily at 12 noon. 90 capsule 3  . LORazepam (ATIVAN) 0.5 MG tablet Take 1 tablet (0.5 mg total) by mouth daily as needed for anxiety (panic). 30 tablet 5  . tadalafil (CIALIS) 5 MG tablet Take 1 tablet (5 mg total) by mouth daily. 90 tablet 3  . Vitamin D, Ergocalciferol, (DRISDOL) 1.25 MG (50000 UNIT) CAPS capsule TAKE 1 CAPSULE BY MOUTH WEEKLY 12 capsule 3   No current facility-administered medications for this visit.    REVIEW OF SYSTEMS:   Constitutional: ( - ) fevers, ( - )  chills , ( - ) night sweats Eyes: ( - ) blurriness of vision, ( - ) double vision, ( - ) watery eyes Ears, nose, mouth, throat, and face: ( + ) mucositis, ( + ) sore throat Respiratory: ( + ) cough, ( - ) dyspnea, ( - ) wheezes Cardiovascular: ( - ) palpitation, ( - ) chest discomfort, ( - ) lower extremity swelling Gastrointestinal:  ( - ) nausea, ( - ) heartburn, ( - ) change in bowel habits Skin: ( - ) abnormal skin rashes Lymphatics: ( - ) new lymphadenopathy, ( - ) easy bruising Neurological: ( - ) numbness, ( - ) tingling, ( - ) new weaknesses Behavioral/Psych: ( - ) mood change, ( - ) new changes  All other systems were reviewed with the patient and are negative.  PHYSICAL EXAMINATION: ECOG PERFORMANCE STATUS: 1 - Symptomatic but completely ambulatory  Vitals:   08/08/20 1557  BP: (!) 118/95  Pulse: 98  Resp: 16  Temp: 97.9 F (36.6 C)  SpO2: 98%   Filed Weights   08/08/20 1557  Weight: 283 lb 3.2 oz (128.5 kg)    GENERAL: well appearing obese Caucasian male in NAD  SKIN: skin color, texture, turgor are normal, no rashes or significant lesions EYES: conjunctiva are pink and non-injected, sclera clear LUNGS: clear to auscultation and percussion with normal breathing effort HEART: regular rate & rhythm and no murmurs and no lower extremity edema ABDOMEN: scar where G tube was in  place  Musculoskeletal: no cyanosis of digits and no clubbing  PSYCH: alert & oriented x 3, fluent speech NEURO: no focal motor/sensory deficits  LABORATORY DATA:  I have reviewed the data as listed CBC Latest Ref Rng & Units 08/08/2020 03/14/2020 11/09/2019  WBC 4.0 - 10.5 K/uL 5.8 6.1 5.8  Hemoglobin 13.0 - 17.0 g/dL 14.9 14.6 13.9  Hematocrit 39.0 - 52.0 % 42.8 42.9 40.4  Platelets 150 - 400 K/uL 250 270 198    CMP Latest Ref Rng & Units 08/08/2020 11/09/2019 09/14/2019  Glucose 70 - 99 mg/dL 92 83 99  BUN 6 - 20 mg/dL 13 14 13   Creatinine 0.61 - 1.24 mg/dL 1.30(H) 1.07 0.86  Sodium 135 - 145 mmol/L 137 140 139  Potassium 3.5 - 5.1 mmol/L 4.0 4.4 3.9  Chloride 98 - 111 mmol/L 107 106 107  CO2 22 - 32 mmol/L 23 27 25   Calcium 8.9 - 10.3 mg/dL 9.4 9.4 9.3  Total Protein 6.5 - 8.1 g/dL 8.3(H) 7.6 7.5  Total Bilirubin 0.3 - 1.2 mg/dL 0.5 0.3 0.6  Alkaline Phos 38 - 126 U/L 64 63 66  AST 15 - 41 U/L 49(H) 27 25  ALT 0 - 44 U/L 81(H) 31 36    RADIOGRAPHIC STUDIES: No results found.  ASSESSMENT & PLAN Dequante Tremaine 50 y.o. male with medical history significant for carcinoma of the tonsillar fossa  presents for a follow up visit.  He has successfully completed his chemoradiation therapy with the last dose of radiation administered on 07/25/2019.  PET/CT on 10/17/2019 showed complete metabolic response to treatment with no residual mass or adenopathy.  On exam today Mr. Tabor has nearly returned to his baseline health.  He continues to have some residual burning of his tongue, particularly with spicy or acidic food.  He has no major symptoms or changes in health since his last visit.   Patient is to undergo surveillance with fibro-optic exams with ENT. These should occur at least every 3 months in the first year and every 6 months in the second year, per NCCN guidelines. Oncology will take a more peripheral role and follow Mr. Gibler every 6 months or sooner if symptoms develop.  #  Carcinoma of the Tonsillar Fossa (Stage II, T3N2M0 p16+) --completed Chemoradiation therapy with cisplatin 40mg /m2 on 07/25/2019. Received 6 doses of cisplatin during the course of radiation.  --Hgb and Cr are stable on therapy. Stable WBC/Hgb on last CBC -- PET CT on 10/16/2019 showed complete metabolic response to treatment with no residual mass or adenopathy. Per NCCN guidelines, there is no data to support routine imaging in an asymptomatic patient after a 12 week post treatment negative PET. Can image if new or concerning symptoms arise.  --patient to be reconnected with ENT for routine surveillance, consisting of a mirror/ fibro-optic exam at least every 3 months for the 1st year, at least every 6 months for the subsequent 2 years. H/N navigator contacted today --last TSH WNL on 10/29/2019. Check at subsequent visits  --Plan for RTC in 6 months with labs  #Throat Pain, resolved --no longer requiring oxycodone or fentanyl.  --rare use of ibuprofen, often not for throat pain.  --burning sensation with spicy/acidic foods on tongue --continue to monitor   #Nutritional Status, improved --PEG tube removed, taking all nutrition by PO --weight stabilized --continue to monitor.   No orders of the defined types were placed in this encounter.  All questions were answered. The patient knows to call the clinic with any problems, questions or concerns.  A total of more than 30 minutes were spent on this encounter and over half of that time was spent on counseling and coordination of care as outlined above.   Ledell Peoples, MD Department of Hematology/Oncology [facility] Phone: [phone] Pager: [phone] Email: [email or web address]  08/12/2020 2:36 PM

## 2020-08-13 ENCOUNTER — Telehealth: Payer: Self-pay | Admitting: Hematology and Oncology

## 2020-08-13 NOTE — Telephone Encounter (Signed)
Scheduled appts per 3/14 sch msg. Pt aware.

## 2020-08-16 ENCOUNTER — Other Ambulatory Visit: Payer: Self-pay | Admitting: Family Medicine

## 2020-08-16 DIAGNOSIS — K219 Gastro-esophageal reflux disease without esophagitis: Secondary | ICD-10-CM

## 2020-08-16 NOTE — Telephone Encounter (Signed)
:   ESOMEPRAZOLE MAG DR 20 MG CAP        Will file in chart as: esomeprazole (NEXIUM) 20 MG capsule   Sig: TAKE 1 CAPSULE BY MOUTH DAILY AT 12 NOON   Disp:  90 capsule  Refills:  3   Start: 08/16/2020   Class: Normal   Non-formulary For: Gastroesophageal reflux disease without esophagitis   Last ordered: 5 months ago by [name], DO    Rx #: [id number]   Pharmacy comment: Alternative Requested:NON FORMULARY, PANTOPRAZOLE, OMEPRAZOLE, DEXILANT.

## 2020-08-16 NOTE — Telephone Encounter (Signed)
Ok to change to Omeprazole 20mg  daily.

## 2020-10-14 ENCOUNTER — Telehealth: Payer: Self-pay | Admitting: *Deleted

## 2020-10-14 MED ORDER — OMEPRAZOLE 20 MG PO CPDR: 20.0000 mg | DELAYED_RELEASE_CAPSULE | Freq: Every day | ORAL | 1 refills | Status: DC

## 2020-10-14 NOTE — Telephone Encounter (Signed)
Fax from [facility] [place] RE: Esomeprazole Change to preferred alternative: Omeprazole, Pantoprazole, Rabeprazole or Dexilant Per 08/16/20 note change to Omeprazole 20 mg 1 QD Change sent & Esomprazole discotinued

## 2020-10-15 ENCOUNTER — Other Ambulatory Visit: Payer: Self-pay | Admitting: *Deleted

## 2020-10-15 DIAGNOSIS — F331 Major depressive disorder, recurrent, moderate: Secondary | ICD-10-CM

## 2020-10-15 MED ORDER — ESCITALOPRAM OXALATE 20 MG PO TABS: 20.0000 mg | ORAL_TABLET | Freq: Every day | ORAL | 3 refills | Status: DC

## 2020-10-15 MED ORDER — OMEPRAZOLE 20 MG PO CPDR: 20.0000 mg | DELAYED_RELEASE_CAPSULE | Freq: Every day | ORAL | 1 refills | Status: DC

## 2020-10-15 NOTE — Telephone Encounter (Signed)
Pt is aware of change, they are cheaper through mail order

## 2020-10-25 ENCOUNTER — Other Ambulatory Visit: Payer: Self-pay

## 2020-10-25 ENCOUNTER — Encounter: Payer: Self-pay | Admitting: Radiation Oncology

## 2020-10-25 ENCOUNTER — Ambulatory Visit
Admission: RE | Admit: 2020-10-25 | Discharge: 2020-10-25 | Disposition: A | Discharge status: Home / Self Care | Payer: 59 | Source: Ambulatory Visit | Attending: Radiation Oncology | Admitting: Radiation Oncology

## 2020-10-25 VITALS — BP 123/87 | HR 74 | Temp 96.8°F | Resp 18 | Ht 72.0 in | Wt 287.1 lb

## 2020-10-25 DIAGNOSIS — Z923 Personal history of irradiation: Secondary | ICD-10-CM | POA: Insufficient documentation

## 2020-10-25 DIAGNOSIS — Z79899 Other long term (current) drug therapy: Secondary | ICD-10-CM | POA: Diagnosis not present

## 2020-10-25 DIAGNOSIS — C090 Malignant neoplasm of tonsillar fossa: Secondary | ICD-10-CM | POA: Diagnosis not present

## 2020-10-25 NOTE — Progress Notes (Signed)
Radiation Oncology         [phone]) [phone] ________________________________  Name: Eric Weaver MRN: [id number]  Date: 10/25/2020  DOB: 1971/02/07  Follow-Up Visit Note outpatient, in person  CC: [name], DO  Izora Gala, MD  Diagnosis and Prior Radiotherapy:       ICD-10-CM   1. Carcinoma of tonsillar fossa ([place])  C09.0     CHIEF COMPLAINT:  Here for follow-up and surveillance of tonsil cancer  Narrative:       Mr. Mandler presents today for follow up after completing radiation to the right tonsil on 07/25/2019  Pain issues, if any: Patient denies any neck or throat pain, but does report occasional discomfort where PAC was placed. Using a feeding tube?: N/A Weight changes, if any:  Wt Readings from Last 3 Encounters:  10/25/20 287 lb 2 oz (130.2 kg)  08/08/20 283 lb 3.2 oz (128.5 kg)  07/15/20 284 lb (128.8 kg)   Swallowing issues, if any: Yes--unable to tolerate drier foods, as well as spicy or acidic foods. Denies any isuess with thin liquids. Smoking or chewing tobacco? None Using fluoride trays daily? Not currently and has not seen a dentist recently Last ENT visit was on: 08/22/2020 Saw Dr. Izora Gala:  "Physical Exam:  --Overall healthy-appearing. Breathing and voice are clear. Neck is soft and free of any masses or fibrosis. Oral cavity and pharynx are clear. The tongue looks healthy but a little bit dry. There is no signs of candidiasis or malignancy. Oropharynx is clear. Tonsils are absent. Indirect exam of the larynx and hypopharynx is normal except for some supraglottic edema. Cords move well. No mucosal lesions identified. Nasal exam clear. Impression & Plans:  --Stable, no evidence of recurrent disease. Recheck 3 months or sooner as needed. --Sinus symptoms and frontal sinus tenderness. Recommend CT imaging of the sinuses. CT 09/02/2020:  Dr. Constance Holster called with result on 09/03/2020 "Gave him the results of the CT sinus. There is fluid in the right  maxillary sinus but the drainage pathways seems patent. All remaining sinuses are clear. He has not really had any of the frontal pain over the past week and a half or so. Recommend contact me if it gets bad again otherwise we will see him at his next appointment"  Other notable issues, if any: Continues to deal with thick phglem and dry mouth. Denies any issues with ear or jaw pain, or difficulty opening his mouth. Reports occasional tighness under chin/down neck from mild lymphedema. Reports on-going fatigue and that he stays tired throughout the day (works 10 hours days for work and has a 50 year-old)  Vitals:   10/25/20 1613  BP: 123/87  Pulse: 74  Resp: 18  Temp: (!) 96.8 F (36 C)  SpO2: 98%      ALLERGIES:  has No Known Allergies.  Meds: Current Outpatient Medications  Medication Sig Dispense Refill  . escitalopram (LEXAPRO) 20 MG tablet Take 1 tablet (20 mg total) by mouth daily. 90 tablet 3  . LORazepam (ATIVAN) 0.5 MG tablet Take 1 tablet (0.5 mg total) by mouth daily as needed for anxiety (panic). 30 tablet 5  . omeprazole (PRILOSEC) 20 MG capsule Take 1 capsule (20 mg total) by mouth daily. 90 capsule 1  . tadalafil (CIALIS) 5 MG tablet Take 1 tablet (5 mg total) by mouth daily. 90 tablet 3  . Vitamin D, Ergocalciferol, (DRISDOL) 1.25 MG (50000 UNIT) CAPS capsule TAKE 1 CAPSULE BY MOUTH WEEKLY 12 capsule 3  No current facility-administered medications for this encounter.    Physical Findings: The patient is in no acute distress. Patient is alert and oriented. Wt Readings from Last 3 Encounters:  10/25/20 287 lb 2 oz (130.2 kg)  08/08/20 283 lb 3.2 oz (128.5 kg)  07/15/20 284 lb (128.8 kg)    height is 6' (1.829 m) and weight is 287 lb 2 oz (130.2 kg). His temporal temperature is 96.8 F (36 C) (abnormal). His blood pressure is 123/87 and his pulse is 74. His respiration is 18 and oxygen saturation is 98%. .  General: Alert and oriented, in no acute distress HEENT:  Head is normocephalic. Extraocular movements are intact. Oropharynx is notable for no visible tumor or thrush.  Oral cavity is clear Neck: Neck is notable for no palpable adenopathy in the cervical or supraclavicular regions Skin: Skin in treatment fields shows satisfactory healing, healthy and intact Heart regular in rate and rhythm Chest clear to auscultation bilaterally MSK ambulatory Psychiatric: Judgment and insight are intact. Affect is appropriate.  ECOG 0  Lab Findings: Lab Results  Component Value Date   WBC 5.8 08/08/2020   HGB 14.9 08/08/2020   HCT 42.8 08/08/2020   MCV 86.5 08/08/2020   PLT 250 08/08/2020    Lab Results  Component Value Date   TSH 2.966 08/08/2020    Radiographic Findings: No results found.  Impression/Plan:    1) Head and Neck Cancer Status: NED  2) Nutritional Status: No active issues Wt Readings from Last 3 Encounters:  10/25/20 287 lb 2 oz (130.2 kg)  08/08/20 283 lb 3.2 oz (128.5 kg)  07/15/20 284 lb (128.8 kg)    PEG tube: Removed  3) Swallowing: Functional, continue speech-language pathology exercises  4) Dental: I urged him to resume follow-up dentistry, and to get dental cleanings every 4 months given his history of head and neck radiation and predisposition for cavities.  I encouraged him to get prescription strength fluoride toothpaste from his community dentist.  I also encouraged him to try ACT mouthwash with fluoride, alcohol free rather than mouthwashes with alcohol  6) Thyroid function: screen annually via med onc or PCP - he understands that he is at a heightened risk for hypothyroidism given his head and neck cancer history Lab Results  Component Value Date   TSH 2.966 08/08/2020    7) Other: We discussed exercise and weightlifting regimens to work into his schedule as feasible.  I will see him back on an as-needed basis.  I wrote down specific instructions for him regarding the recommendations above. I wished him  the very best and he knows to call if he has any additional needs that cannot be met by medical oncology and otolaryngology.  On date of service, in total, I spent 30 minutes on this encounter.  Patient was seen in person. _____________________________________   Eppie Gibson, MD

## 2020-10-25 NOTE — Progress Notes (Signed)
Eric Weaver presents today for follow up after completing radiation to the right tonsil on 07/25/2019  Pain issues, if any: Patient denies any neck or throat pain, but does report occasional discomfort where PAC was placed. Using a feeding tube?: N/A Weight changes, if any:  Wt Readings from Last 3 Encounters:  10/25/20 287 lb 2 oz (130.2 kg)  08/08/20 283 lb 3.2 oz (128.5 kg)  07/15/20 284 lb (128.8 kg)   Swallowing issues, if any: Yes--unable to tolerate drier foods, as well as spicy or acidic foods. Denies any isuess with thin liquids. Smoking or chewing tobacco? None Using fluoride trays daily? Not currently Last ENT visit was on: 08/22/2020 Saw Dr. Izora Gala:  "Physical Exam:  --Overall healthy-appearing. Breathing and voice are clear. Neck is soft and free of any masses or fibrosis. Oral cavity and pharynx are clear. The tongue looks healthy but a little bit dry. There is no signs of candidiasis or malignancy. Oropharynx is clear. Tonsils are absent. Indirect exam of the larynx and hypopharynx is normal except for some supraglottic edema. Cords move well. No mucosal lesions identified. Nasal exam clear. Impression & Plans:  --Stable, no evidence of recurrent disease. Recheck 3 months or sooner as needed. --Sinus symptoms and frontal sinus tenderness. Recommend CT imaging of the sinuses. CT 09/02/2020:  Dr. Constance Holster called with result on 09/03/2020 "Gave him the results of the CT sinus. There is fluid in the right maxillary sinus but the drainage pathways seems patent. All remaining sinuses are clear. He has not really had any of the frontal pain over the past week and a half or so. Recommend contact me if it gets bad again otherwise we will see him at his next appointment"  Other notable issues, if any: Continues to deal with thick phglem and dry mouth. Denies any issues with ear or jaw pain, or difficulty opening his mouth. Reports occasional tighness under chin/down neck from mild  lymphedema. Reports on-going fatigue and that he stays tired throughout the day (works 10 hours days for work and has a 50 year-old)  Vitals:   10/25/20 1613  BP: 123/87  Pulse: 74  Resp: 18  Temp: (!) 96.8 F (36 C)  SpO2: 98%

## 2021-01-14 ENCOUNTER — Ambulatory Visit: Payer: 59 | Admitting: Family Medicine

## 2021-02-13 ENCOUNTER — Inpatient Hospital Stay: Payer: 59

## 2021-02-13 ENCOUNTER — Inpatient Hospital Stay: Payer: 59 | Attending: Hematology and Oncology | Admitting: Hematology and Oncology

## 2021-02-13 ENCOUNTER — Other Ambulatory Visit: Payer: Self-pay

## 2021-02-13 ENCOUNTER — Other Ambulatory Visit: Payer: Self-pay | Admitting: Hematology and Oncology

## 2021-02-13 VITALS — BP 132/86 | HR 70 | Temp 97.8°F | Resp 18 | Ht 72.0 in | Wt 295.5 lb

## 2021-02-13 DIAGNOSIS — I10 Essential (primary) hypertension: Secondary | ICD-10-CM | POA: Insufficient documentation

## 2021-02-13 DIAGNOSIS — Z9221 Personal history of antineoplastic chemotherapy: Secondary | ICD-10-CM | POA: Insufficient documentation

## 2021-02-13 DIAGNOSIS — Z85818 Personal history of malignant neoplasm of other sites of lip, oral cavity, and pharynx: Secondary | ICD-10-CM | POA: Diagnosis not present

## 2021-02-13 DIAGNOSIS — Z923 Personal history of irradiation: Secondary | ICD-10-CM | POA: Insufficient documentation

## 2021-02-13 DIAGNOSIS — C090 Malignant neoplasm of tonsillar fossa: Secondary | ICD-10-CM

## 2021-02-13 DIAGNOSIS — Z79899 Other long term (current) drug therapy: Secondary | ICD-10-CM | POA: Diagnosis not present

## 2021-02-13 DIAGNOSIS — Z95828 Presence of other vascular implants and grafts: Secondary | ICD-10-CM | POA: Diagnosis not present

## 2021-02-13 LAB — CMP (CANCER CENTER ONLY)
ALT: 100 U/L — ABNORMAL HIGH (ref 0–44)
AST: 69 U/L — ABNORMAL HIGH (ref 15–41)
Albumin: 4.2 g/dL (ref 3.5–5.0)
Alkaline Phosphatase: 70 U/L (ref 38–126)
Anion gap: 9 (ref 5–15)
BUN: 12 mg/dL (ref 6–20)
CO2: 27 mmol/L (ref 22–32)
Calcium: 9.4 mg/dL (ref 8.9–10.3)
Chloride: 105 mmol/L (ref 98–111)
Creatinine: 1.01 mg/dL (ref 0.61–1.24)
GFR, Estimated: >60 mL/min (ref >60)
Glucose, Bld: 109 mg/dL — ABNORMAL HIGH (ref 70–99)
Potassium: 4.2 mmol/L (ref 3.5–5.1)
Sodium: 141 mmol/L (ref 135–145)
Total Bilirubin: 0.5 mg/dL (ref 0.3–1.2)
Total Protein: 7.6 g/dL (ref 6.5–8.1)

## 2021-02-13 LAB — CBC WITH DIFFERENTIAL (CANCER CENTER ONLY)
Abs Immature Granulocytes: 0.03 10*3/uL (ref 0.00–0.07)
Basophils Absolute: 0 10*3/uL (ref 0.0–0.1)
Basophils Relative: 1 %
Eosinophils Absolute: 0.1 10*3/uL (ref 0.0–0.5)
Eosinophils Relative: 2 %
HCT: 41.7 % (ref 39.0–52.0)
Hemoglobin: 14.3 g/dL (ref 13.0–17.0)
Immature Granulocytes: 1 %
Lymphocytes Relative: 22 %
Lymphs Abs: 1.2 10*3/uL (ref 0.7–4.0)
MCH: 30.6 pg (ref 26.0–34.0)
MCHC: 34.3 g/dL (ref 30.0–36.0)
MCV: 89.3 fL (ref 80.0–100.0)
Monocytes Absolute: 0.4 10*3/uL (ref 0.1–1.0)
Monocytes Relative: 7 %
Neutro Abs: 3.7 10*3/uL (ref 1.7–7.7)
Neutrophils Relative %: 67 %
Platelet Count: 228 10*3/uL (ref 150–400)
RBC: 4.67 MIL/uL (ref 4.22–5.81)
RDW: 14 % (ref 11.5–15.5)
WBC Count: 5.5 10*3/uL (ref 4.0–10.5)
nRBC: 0 % (ref 0.0–0.2)

## 2021-02-13 NOTE — Progress Notes (Signed)
[place] [phone]   [phone]) [phone]  PROGRESS NOTE  Patient Care Team: [name], DO as PCP - General (Family Medicine) Izora Gala, MD as Consulting Physician (Otolaryngology) Eppie Gibson, MD as Attending Physician (Radiation Oncology) Leota Sauers, RN (Inactive) as Registered Nurse Orson Slick, MD as Consulting Physician (Hematology and Oncology) Sharen Counter, CCC-SLP as Speech Language Pathologist (Speech Pathology) Karie Mainland, RD as Dietitian (Nutrition) Wynelle Beckmann, Melodie Bouillon, PT as Physical Therapist (Physical Therapy) Kennith Center, LCSW as Social Worker Malmfelt, Stephani Police, RN as Oncology Nurse Navigator (Oncology)  Hematological/Oncological History # 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 CT scan performed showing 4.1 x 4.0 x 5.3 cm mass centered at the right palatine tonsil, highly concerning for primary head and neck malignancy, likely squamous cell carcinoma, additionally there were partially necrotic right level II and III adenopathy. 2) 04/26/2019: referred to ENT, underwent right tonsillar biopsy confirming invasive squamous cell cancer 3) 05/17/2019: PET CT scan showed hypermetabolic mass at right palantine tonsil and hypermetabolic right level 2 and 3 lymph nodes. There was also mildly hypermetabolic lymph node in the left level 2 neck.  4) 05/24/2019: establish care with Dr. Lorenso Courier  5) 06/07/2019: started radiation therapy with Dr. Isidore Moos 6) 06/16/2019: Started Cisplatin '40mg'$ /m2 with concurrent radiation 7) 06/23/2019: Week 2 cisplatin '40mg'$ /m2 with concurrent radiation 9) 06/30/2019: Week 3 cisplatin '40mg'$ /m2 with concurrent radiation 10) 07/07/2019: Week 4 cisplatin '40mg'$ /m2 with concurrent radiation 11) 07/14/2019: Week 5 cisplatin '40mg'$ /m2 with concurrent radiation 12) 07/21/2019: Week 6 cisplatin '40mg'$ /m2 with  concurrent radiation 13) 07/25/2019: Completion of Chemoradiation.  14) 10/16/2019:  PET CT showed complete metabolic response to treatment with no residual tumor or adenopathy  Interval History:  Eric Weaver 50 y.o. male with medical history significant for carcinoma of the tonsillar fossa  presents for a follow up visit. The patient's last visit was on 08/08/2020. In the interim he has had no changes in his health.   On exam today Mr. Heike notes he had a COVID infection in June/July 2020.  He notes that the infection was "rough".  He notes that he had terrible headache and was quite exhausted.  He still has some residual fatigue.  Otherwise he notes he still has dry mouth and foods like pepperoni pizza are "too spicy for him".  He notes that he has had no difficulty with swallowing has not noticed any bumps or lumps in his neck.  He notes he has been gaining weight and he attributes that to the fatigue, COVID.  He has otherwise been well with no issues of fevers, chills, sweats, nausea, vomiting or diarrhea.  A full 10 point ROS is listed below.  The only other request the patient had was for his port to be removed today.  MEDICAL HISTORY:  Past Medical History:  Diagnosis Date   Depression    Essential hypertension 08/02/2018   GERD (gastroesophageal reflux disease)    History of nephrolithiasis    Vitamin D deficiency     SURGICAL HISTORY: Past Surgical History:  Procedure Laterality Date   Biopsy of right tonsil Right 04/24/2019   Positive for squamous cell carcinoma   IR GASTROSTOMY TUBE MOD SED  06/06/2019   IR GASTROSTOMY TUBE REMOVAL  10/06/2019   IR IMAGING GUIDED PORT INSERTION  06/06/2019   IR REMOVAL TUN ACCESS W/ PORT W/O FL MOD SED  11/24/2019    ALLERGIES:  has No Known Allergies.  MEDICATIONS:  Current Outpatient Medications  Medication Sig Dispense Refill   escitalopram (LEXAPRO) 20 MG tablet Take 1 tablet (20 mg total) by mouth daily. 90 tablet 3   LORazepam (ATIVAN)  0.5 MG tablet Take 1 tablet (0.5 mg total) by mouth daily as needed for anxiety (panic). 30 tablet 5   omeprazole (PRILOSEC) 20 MG capsule Take 1 capsule (20 mg total) by mouth daily. 90 capsule 1   tadalafil (CIALIS) 5 MG tablet Take 1 tablet (5 mg total) by mouth daily. 90 tablet 3   Vitamin D, Ergocalciferol, (DRISDOL) 1.25 MG (50000 UNIT) CAPS capsule TAKE 1 CAPSULE BY MOUTH WEEKLY 12 capsule 3   No current facility-administered medications for this visit.    REVIEW OF SYSTEMS:   Constitutional: ( - ) fevers, ( - )  chills , ( - ) night sweats Eyes: ( - ) blurriness of vision, ( - ) double vision, ( - ) watery eyes Ears, nose, mouth, throat, and face: ( + ) mucositis, ( + ) sore throat Respiratory: ( + ) cough, ( - ) dyspnea, ( - ) wheezes Cardiovascular: ( - ) palpitation, ( - ) chest discomfort, ( - ) lower extremity swelling Gastrointestinal:  ( - ) nausea, ( - ) heartburn, ( - ) change in bowel habits Skin: ( - ) abnormal skin rashes Lymphatics: ( - ) new lymphadenopathy, ( - ) easy bruising Neurological: ( - ) numbness, ( - ) tingling, ( - ) new weaknesses Behavioral/Psych: ( - ) mood change, ( - ) new changes  All other systems were reviewed with the patient and are negative.  PHYSICAL EXAMINATION: ECOG PERFORMANCE STATUS: 1 - Symptomatic but completely ambulatory  Vitals:   02/13/21 1603  BP: 132/86  Pulse: 70  Resp: 18  Temp: 97.8 F (36.6 C)  SpO2: 98%   Filed Weights   02/13/21 1603  Weight: 295 lb 8 oz (134 kg)    GENERAL: well appearing obese Caucasian male in NAD  SKIN: skin color, texture, turgor are normal, no rashes or significant lesions EYES: conjunctiva are pink and non-injected, sclera clear NECK: normal thyroid, no lymphadenopathy.  LUNGS: clear to auscultation and percussion with normal breathing effort HEART: regular rate & rhythm and no murmurs and no lower extremity edema Musculoskeletal: no cyanosis of digits and no clubbing  PSYCH: alert &  oriented x 3, fluent speech NEURO: no focal motor/sensory deficits  LABORATORY DATA:  I have reviewed the data as listed CBC Latest Ref Rng & Units 02/13/2021 08/08/2020 03/14/2020  WBC 4.0 - 10.5 K/uL 5.5 5.8 6.1  Hemoglobin 13.0 - 17.0 g/dL 14.3 14.9 14.6  Hematocrit 39.0 - 52.0 % 41.7 42.8 42.9  Platelets 150 - 400 K/uL 228 250 270    CMP Latest Ref Rng & Units 02/13/2021 08/08/2020 11/09/2019  Glucose 70 - 99 mg/dL 109(H) 92 83  BUN 6 - 20 mg/dL '12 13 14  '$ Creatinine 0.61 - 1.24 mg/dL 1.01 1.30(H) 1.07  Sodium 135 - 145 mmol/L 141 137 140  Potassium 3.5 - 5.1 mmol/L 4.2 4.0 4.4  Chloride 98 - 111 mmol/L 105 107 106  CO2 22 - 32 mmol/L '27 23 27  '$ Calcium 8.9 - 10.3 mg/dL 9.4 9.4 9.4  Total Protein 6.5 - 8.1 g/dL 7.6 8.3(H) 7.6  Total Bilirubin 0.3 - 1.2 mg/dL 0.5 0.5 0.3  Alkaline Phos 38 - 126 U/L 70 64 63  AST 15 -  41 U/L 69(H) 49(H) 27  ALT 0 - 44 U/L 100(H) 81(H) 31    RADIOGRAPHIC STUDIES: No results found.  ASSESSMENT & PLAN Eric Weaver 50 y.o. male with medical history significant for carcinoma of the tonsillar fossa  presents for a follow up visit.  He has successfully completed his chemoradiation therapy with the last dose of radiation administered on 07/25/2019.  PET/CT on 10/17/2019 showed complete metabolic response to treatment with no residual mass or adenopathy.  Patient is to undergo periodic surveillance exams with ENT. These should occur at least every 3 months in the first year and every 6 months in the second year, per NCCN guidelines. Oncology will take a more peripheral role and follow Mr. Marmolejos every 6 months or sooner if symptoms develop.  # Carcinoma of the Tonsillar Fossa (Stage II, T3N2M0 p16+) --completed Chemoradiation therapy with cisplatin '40mg'$ /m2 on 07/25/2019. Received 6 doses of cisplatin during the course of radiation.  --Hgb and Cr are stable.  -- PET CT on 10/16/2019 showed complete metabolic response to treatment with no residual mass or  adenopathy. Per NCCN guidelines, there is no data to support routine imaging in an asymptomatic patient after a 12 week post treatment negative PET. Can image if new or concerning symptoms arise.  --patient to be reconnected with ENT for routine surveillance, consisting of a mirror/ fibro-optic exam at least every 3 months for the 1st year, at least every 6 months for the subsequent 2 years. H/N navigator contacted today --TSH from today pending, last noted to be 2.966 on 08/08/2020.   --Plan for RTC in 6 months with labs  #Throat Pain, resolved --no longer requiring oxycodone or fentanyl.  --rare use of ibuprofen, often not for throat pain.  --continued burning sensation with spicy/acidic foods on tongue --continue to monitor   #Nutritional Status, improved --PEG tube removed, taking all nutrition by PO --weight stabilized --continue to monitor.   No orders of the defined types were placed in this encounter.  All questions were answered. The patient knows to call the clinic with any problems, questions or concerns.  A total of more than 30 minutes were spent on this encounter and over half of that time was spent on counseling and coordination of care as outlined above.   Ledell Peoples, MD Department of Hematology/Oncology [place] at [facility] Phone: [phone] Pager: [phone] Email: [email or web address]  02/17/2021 9:27 AM

## 2021-02-14 LAB — TSH: TSH: 2.132 u[IU]/mL (ref 0.320–4.118)

## 2021-02-17 ENCOUNTER — Other Ambulatory Visit: Payer: Self-pay | Admitting: Family Medicine

## 2021-02-17 ENCOUNTER — Encounter: Payer: Self-pay | Admitting: Hematology and Oncology

## 2021-02-18 ENCOUNTER — Telehealth: Payer: Self-pay | Admitting: Hematology and Oncology

## 2021-02-18 NOTE — Telephone Encounter (Signed)
Scheduled appt per 9/19 sch msg. Called pt, no answer. Left msg with appt date and time.

## 2021-03-31 ENCOUNTER — Telehealth: Payer: Self-pay | Admitting: Family Medicine

## 2021-03-31 NOTE — Telephone Encounter (Signed)
  Prescription Request  03/31/2021  Is this a "Controlled Substance" medicine? YES  Have you seen your PCP in the last 2 weeks? NO   If YES, route message to pool  -  If NO, patient needs to be scheduled for appointment.  What is the name of the medication or equipment? LORazepam (ATIVAN) 0.5 MG tablet   Have you contacted your pharmacy to request a refill? Yes he has two week supply left he does not take this medication daily only when needed has appt with Dr. Darnell Level. Her first avaialble is on 05/30/21 would like to come in sooner if possible he is aware has to be seen in order for med to be refilled   Which pharmacy would you like this sent to? [facility]    Patient notified that their request is being sent to the clinical staff for review and that they should receive a response within 2 business days.

## 2021-04-01 NOTE — Telephone Encounter (Signed)
Needs OV. Eric Weaver, see if there is a sooner available if someone cancel.s  Refill denied on this controlled substance.

## 2021-04-08 ENCOUNTER — Ambulatory Visit: Payer: 59 | Admitting: Family Medicine

## 2021-04-08 ENCOUNTER — Other Ambulatory Visit: Payer: Self-pay

## 2021-04-08 ENCOUNTER — Encounter: Payer: Self-pay | Admitting: Family Medicine

## 2021-04-08 VITALS — BP 134/83 | HR 57 | Temp 98.3°F | Ht 72.0 in | Wt 297.0 lb

## 2021-04-08 DIAGNOSIS — E559 Vitamin D deficiency, unspecified: Secondary | ICD-10-CM

## 2021-04-08 DIAGNOSIS — Z23 Encounter for immunization: Secondary | ICD-10-CM | POA: Diagnosis not present

## 2021-04-08 DIAGNOSIS — F331 Major depressive disorder, recurrent, moderate: Secondary | ICD-10-CM | POA: Diagnosis not present

## 2021-04-08 DIAGNOSIS — G4719 Other hypersomnia: Secondary | ICD-10-CM

## 2021-04-08 DIAGNOSIS — F410 Panic disorder [episodic paroxysmal anxiety] without agoraphobia: Secondary | ICD-10-CM

## 2021-04-08 DIAGNOSIS — F411 Generalized anxiety disorder: Secondary | ICD-10-CM | POA: Diagnosis not present

## 2021-04-08 DIAGNOSIS — M2550 Pain in unspecified joint: Secondary | ICD-10-CM

## 2021-04-08 DIAGNOSIS — Z79899 Other long term (current) drug therapy: Secondary | ICD-10-CM

## 2021-04-08 DIAGNOSIS — Z923 Personal history of irradiation: Secondary | ICD-10-CM

## 2021-04-08 MED ORDER — LORAZEPAM 0.5 MG PO TABS: 0.5000 mg | ORAL_TABLET | Freq: Every day | ORAL | 3 refills | Status: DC | PRN

## 2021-04-08 NOTE — Progress Notes (Signed)
Subjective: CC:GAD/ panic, excessive daytime fatigue PCP: [name], DO JOA:Eric Weaver is a 50 y.o. male presenting to clinic today for:  1.  Anxiety disorder with panic attacks Patient reports that he has been gradually weaning from the Lexapro seems not found that to be especially helpful.  He is currently down to 10 mg daily.  He has unfortunately seen adverse side effects from withdrawal which she does not wish to revisit.  He uses the Ativan 0.5 mg extremely sparingly.  It does take the edge off but is not particularly helpful otherwise.  Wondering if he can increase the dose.  Denies any excessive daytime sedation.  He still has about 8 tablets left of this medication.  No visual auditory hallucinations.  No memory loss.  2.  Excessive daytime fatigue Reports excessive daytime fatigue that is particularly evident at the end of his day.  He does report that he works long vigorous hours.  He has noted improvement in his snoring since he had chemo and radiation to the tonsillar cancer.  He does not report his wife having appreciated any pauses in breathing   3.  Polyarthritis Patient reports he suffers from polyarthritis, particularly in his hips, knees and hands.  Symptoms are waxing and waning but he notes the most stiffness in his hands when he rises from sleep.  There is a family history of what he thinks is rheumatoid arthritis in his grandmother.  No known other autoimmune diseases.  4.  Elevated liver function test Patient notes that he had a mild elevation in his liver function tests recently and would like to have these repeated.  No reports of excessive alcohol intake, excessive use of Tylenol  ROS: Per HPI  No Known Allergies Past Medical History:  Diagnosis Date   Depression    Essential hypertension 08/02/2018   GERD (gastroesophageal reflux disease)    History of nephrolithiasis    Vitamin D deficiency     Current Outpatient Medications:    LORazepam  (ATIVAN) 0.5 MG tablet, Take 1 tablet (0.5 mg total) by mouth daily as needed for anxiety (panic)., Disp: 30 tablet, Rfl: 5   omeprazole (PRILOSEC) 20 MG capsule, Take 1 capsule (20 mg total) by mouth daily., Disp: 90 capsule, Rfl: 1   tadalafil (CIALIS) 5 MG tablet, Take 1 tablet (5 mg total) by mouth daily., Disp: 90 tablet, Rfl: 3   Vitamin D, Ergocalciferol, (DRISDOL) 1.25 MG (50000 UNIT) CAPS capsule, TAKE 1 CAPSULE BY MOUTH WEEKLY, Disp: 12 capsule, Rfl: 3   escitalopram (LEXAPRO) 20 MG tablet, Take 1 tablet (20 mg total) by mouth daily. (Patient not taking: Reported on 04/08/2021), Disp: 90 tablet, Rfl: 3 Social History   Socioeconomic History   Marital status: Married    Spouse name: Not on file   Number of children: Not on file   Years of education: Not on file   Highest education level: Not on file  Occupational History   Not on file  Tobacco Use   Smoking status: Never   Smokeless tobacco: Never  Vaping Use   Vaping Use: Never used  Substance and Sexual Activity   Alcohol use: Yes    Comment: occasional   Drug use: No   Sexual activity: Not Currently  Other Topics Concern   Not on file  Social History Narrative   Not on file   Social Determinants of Health   Financial Resource Strain: Not on file  Food Insecurity: Not on file  Transportation  Needs: Not on file  Physical Activity: Not on file  Stress: Not on file  Social Connections: Not on file  Intimate Partner Violence: Not on file   Family History  Problem Relation Age of Onset   Hypertension Mother    Heart attack Father     Objective: Office vital signs reviewed. BP 134/83   Pulse (!) 57   Temp 98.3 F (36.8 C) (Temporal)   Ht 6' (1.829 m)   Wt 297 lb (134.7 kg)   SpO2 97%   BMI 40.28 kg/m   Physical Examination:  General: Awake, alert, morbidly obese, neck girth enlarged.  No acute distress HEENT: No exophthalmos or goiter. Cardio: Bradycardic rate   Pulm: Normal work of breathing on room  air MSK: Ambulating independently.  No gross deformity appreciated in the hands. Psych: Mood is stable.  Patient is very pleasant, interactive.  Thought processes linear Depression screen [facility] 2/9 04/08/2021 03/14/2020 09/13/2018  Decreased Interest '1 2 1  ' Down, Depressed, Hopeless 1 1 0  PHQ - 2 Score '2 3 1  ' Altered sleeping 0 1 1  Tired, decreased energy '1 1 1  ' Change in appetite 0 0 0  Feeling bad or failure about yourself  0 0 -  Trouble concentrating - 1 0  Moving slowly or fidgety/restless 0 0 0  Suicidal thoughts 0 0 0  PHQ-9 Score '3 6 3  ' Difficult doing work/chores - Not difficult at all -   GAD 7 : Generalized Anxiety Score 04/08/2021 07/15/2020 03/14/2020 09/13/2018  Nervous, Anxious, on Edge '3 2 1 1  ' Control/stop worrying 0 0 1 0  Worry too much - different things 0 0 1 0  Trouble relaxing 1 0 1 0  Restless 0 0 0 0  Easily annoyed or irritable '3 2 2 1  ' Afraid - awful might happen 0 0 0 0  Total GAD 7 Score '7 4 6 2  ' Anxiety Difficulty Somewhat difficult Somewhat difficult Not difficult at all Not difficult at all   Assessment/ Plan: 50 y.o. male   Moderate episode of recurrent major depressive disorder ([place]) - Plan: CMP14+EGFR  Generalized anxiety disorder with panic attacks - Plan: CMP14+EGFR, LORazepam (ATIVAN) 0.5 MG tablet  Controlled substance agreement signed - Plan: ToxASSURE Select 13 (MW), Urine  Excessive daytime sleepiness - Plan: TSH, T4, Free, CMP14+EGFR  History of head and neck radiation - Plan: TSH, T4, Free, CMP14+EGFR  Polyarthralgia - Plan: Arthritis Panel, CBC, CMP14+EGFR  Vitamin D deficiency - Plan: VITAMIN D 25 Hydroxy (Vit-D Deficiency, Fractures), CMP14+EGFR  Need for immunization against influenza - Plan: Flu Vaccine QUAD 7moIM (Fluarix, Fluzone & Alfiuria Quad PF)  Weaning off of Lexapro.  Plans to utilize Ativan as needed exclusively for now.  We discussed risks of benzodiazepine use.  I have agreed to allow for 1 to 2 tablets as needed  but have again reiterated sparing use of this medicine, which I certainly think he is doing based on refill patterns.  The national narcotic database was reviewed and there were no red flags.  His controlled substance contract and urine drug screen were updated as per office policy today  For his excessive daytime sleepiness I do wonder if there is an undiagnosed sleep apnea given his body habitus and symptomology.  However, we will check free T4, TSH and CMP to rule out metabolic etiology given history of head and neck radiation  For his polyarthralgia, no gross joint deformities were appreciated.  His range of motion seems  to be fairly preserved and he was able to ambulate independently.  I am ordering an arthritis panel to further evaluate  Plan to check vitamin D level.  He is compliant with his supplementation  Influenza vaccination administered  Orders Placed This Encounter  Procedures   ToxASSURE Select 13 (MW), Urine   TSH   T4, Free   Arthritis Panel   CBC   VITAMIN D 25 Hydroxy (Vit-D Deficiency, Fractures)   CMP14+EGFR   No orders of the defined types were placed in this encounter.    [name], DO [place] [phone]

## 2021-04-09 ENCOUNTER — Encounter: Payer: Self-pay | Admitting: Hematology and Oncology

## 2021-04-09 LAB — ARTHRITIS PANEL
Basophils Absolute: 0 10*3/uL (ref 0.0–0.2)
Basos: 1 %
EOS (ABSOLUTE): 0.1 10*3/uL (ref 0.0–0.4)
Eos: 2 %
Hematocrit: 41.8 % (ref 37.5–51.0)
Hemoglobin: 14.5 g/dL (ref 13.0–17.7)
Immature Grans (Abs): 0 10*3/uL (ref 0.0–0.1)
Immature Granulocytes: 1 %
Lymphocytes Absolute: 1.3 10*3/uL (ref 0.7–3.1)
Lymphs: 20 %
MCH: 30.1 pg (ref 26.6–33.0)
MCHC: 34.7 g/dL (ref 31.5–35.7)
MCV: 87 fL (ref 79–97)
Monocytes Absolute: 0.4 10*3/uL (ref 0.1–0.9)
Monocytes: 6 %
Neutrophils Absolute: 4.7 10*3/uL (ref 1.4–7.0)
Neutrophils: 70 %
Platelets: 260 10*3/uL (ref 150–450)
RBC: 4.81 x10E6/uL (ref 4.14–5.80)
RDW: 13.4 % (ref 11.6–15.4)
Rheumatoid fact SerPl-aCnc: <10 IU/mL (ref <14.0)
Sed Rate: 6 mm/hr (ref 0–30)
Uric Acid: 6.5 mg/dL (ref 3.8–8.4)
WBC: 6.6 10*3/uL (ref 3.4–10.8)

## 2021-04-09 LAB — CMP14+EGFR
ALT: 74 IU/L — ABNORMAL HIGH (ref 0–44)
AST: 49 IU/L — ABNORMAL HIGH (ref 0–40)
Albumin/Globulin Ratio: 1.6 (ref 1.2–2.2)
Albumin: 4.4 g/dL (ref 4.0–5.0)
Alkaline Phosphatase: 66 IU/L (ref 44–121)
BUN/Creatinine Ratio: 13 (ref 9–20)
BUN: 11 mg/dL (ref 6–24)
Bilirubin Total: 0.4 mg/dL (ref 0.0–1.2)
CO2: 26 mmol/L (ref 20–29)
Calcium: 9.3 mg/dL (ref 8.7–10.2)
Chloride: 102 mmol/L (ref 96–106)
Creatinine, Ser: 0.85 mg/dL (ref 0.76–1.27)
Globulin, Total: 2.8 g/dL (ref 1.5–4.5)
Glucose: 98 mg/dL (ref 70–99)
Potassium: 4.3 mmol/L (ref 3.5–5.2)
Sodium: 139 mmol/L (ref 134–144)
Total Protein: 7.2 g/dL (ref 6.0–8.5)
eGFR: 106 mL/min/{1.73_m2} (ref >59)

## 2021-04-09 LAB — TSH: TSH: 2.85 u[IU]/mL (ref 0.450–4.500)

## 2021-04-09 LAB — T4, FREE: Free T4: 0.83 ng/dL (ref 0.82–1.77)

## 2021-04-09 LAB — VITAMIN D 25 HYDROXY (VIT D DEFICIENCY, FRACTURES): Vit D, 25-Hydroxy: 31.3 ng/mL (ref 30.0–100.0)

## 2021-04-14 LAB — TOXASSURE SELECT 13 (MW), URINE

## 2021-05-03 ENCOUNTER — Other Ambulatory Visit: Payer: Self-pay | Admitting: Family Medicine

## 2021-05-04 ENCOUNTER — Other Ambulatory Visit: Payer: Self-pay | Admitting: Family Medicine

## 2021-05-08 ENCOUNTER — Encounter: Payer: Self-pay | Admitting: Family Medicine

## 2021-05-09 ENCOUNTER — Other Ambulatory Visit: Payer: Self-pay | Admitting: Family Medicine

## 2021-05-09 DIAGNOSIS — F410 Panic disorder [episodic paroxysmal anxiety] without agoraphobia: Secondary | ICD-10-CM

## 2021-05-09 DIAGNOSIS — F331 Major depressive disorder, recurrent, moderate: Secondary | ICD-10-CM

## 2021-05-09 MED ORDER — SERTRALINE HCL 50 MG PO TABS
50.0000 mg | ORAL_TABLET | Freq: Every day | ORAL | 1 refills | Status: DC
Start: 2021-05-09 — End: 2021-06-05

## 2021-05-30 ENCOUNTER — Ambulatory Visit: Payer: 59 | Admitting: Family Medicine

## 2021-06-05 ENCOUNTER — Other Ambulatory Visit: Payer: Self-pay | Admitting: *Deleted

## 2021-06-05 DIAGNOSIS — F410 Panic disorder [episodic paroxysmal anxiety] without agoraphobia: Secondary | ICD-10-CM

## 2021-06-05 DIAGNOSIS — F331 Major depressive disorder, recurrent, moderate: Secondary | ICD-10-CM

## 2021-06-05 MED ORDER — SERTRALINE HCL 50 MG PO TABS: 50.0000 mg | ORAL_TABLET | Freq: Every day | ORAL | 0 refills | Status: DC

## 2021-07-04 ENCOUNTER — Other Ambulatory Visit: Payer: Self-pay | Admitting: Family Medicine

## 2021-07-04 DIAGNOSIS — F410 Panic disorder [episodic paroxysmal anxiety] without agoraphobia: Secondary | ICD-10-CM

## 2021-07-04 DIAGNOSIS — F411 Generalized anxiety disorder: Secondary | ICD-10-CM

## 2021-07-04 DIAGNOSIS — F331 Major depressive disorder, recurrent, moderate: Secondary | ICD-10-CM

## 2021-07-31 ENCOUNTER — Telehealth: Payer: Self-pay | Admitting: Hematology and Oncology

## 2021-07-31 NOTE — Telephone Encounter (Signed)
R/s per provider pal, message has been left with pt ?

## 2021-08-03 ENCOUNTER — Other Ambulatory Visit: Payer: Self-pay | Admitting: Family Medicine

## 2021-08-03 DIAGNOSIS — F331 Major depressive disorder, recurrent, moderate: Secondary | ICD-10-CM

## 2021-08-03 DIAGNOSIS — F410 Panic disorder [episodic paroxysmal anxiety] without agoraphobia: Secondary | ICD-10-CM

## 2021-08-17 ENCOUNTER — Other Ambulatory Visit: Payer: Self-pay | Admitting: Family Medicine

## 2021-08-18 ENCOUNTER — Inpatient Hospital Stay: Payer: 59 | Admitting: Hematology and Oncology

## 2021-08-18 ENCOUNTER — Inpatient Hospital Stay: Payer: 59

## 2021-09-01 ENCOUNTER — Inpatient Hospital Stay: Payer: 59

## 2021-09-01 ENCOUNTER — Inpatient Hospital Stay: Payer: 59 | Admitting: Hematology and Oncology

## 2021-09-05 ENCOUNTER — Inpatient Hospital Stay: Payer: 59 | Attending: Hematology and Oncology

## 2021-09-05 ENCOUNTER — Other Ambulatory Visit: Payer: Self-pay

## 2021-09-05 ENCOUNTER — Other Ambulatory Visit: Payer: Self-pay | Admitting: Family Medicine

## 2021-09-05 ENCOUNTER — Other Ambulatory Visit: Payer: Self-pay | Admitting: Hematology and Oncology

## 2021-09-05 ENCOUNTER — Inpatient Hospital Stay: Payer: 59 | Admitting: Hematology and Oncology

## 2021-09-05 VITALS — BP 127/76 | HR 79 | Temp 97.9°F | Wt 293.2 lb

## 2021-09-05 DIAGNOSIS — F331 Major depressive disorder, recurrent, moderate: Secondary | ICD-10-CM

## 2021-09-05 DIAGNOSIS — E559 Vitamin D deficiency, unspecified: Secondary | ICD-10-CM | POA: Insufficient documentation

## 2021-09-05 DIAGNOSIS — K219 Gastro-esophageal reflux disease without esophagitis: Secondary | ICD-10-CM | POA: Diagnosis not present

## 2021-09-05 DIAGNOSIS — I10 Essential (primary) hypertension: Secondary | ICD-10-CM | POA: Insufficient documentation

## 2021-09-05 DIAGNOSIS — C090 Malignant neoplasm of tonsillar fossa: Secondary | ICD-10-CM

## 2021-09-05 DIAGNOSIS — Z9221 Personal history of antineoplastic chemotherapy: Secondary | ICD-10-CM | POA: Insufficient documentation

## 2021-09-05 DIAGNOSIS — Z79899 Other long term (current) drug therapy: Secondary | ICD-10-CM | POA: Diagnosis not present

## 2021-09-05 DIAGNOSIS — Z95828 Presence of other vascular implants and grafts: Secondary | ICD-10-CM | POA: Diagnosis not present

## 2021-09-05 DIAGNOSIS — Z87442 Personal history of urinary calculi: Secondary | ICD-10-CM | POA: Insufficient documentation

## 2021-09-05 DIAGNOSIS — Z923 Personal history of irradiation: Secondary | ICD-10-CM | POA: Insufficient documentation

## 2021-09-05 DIAGNOSIS — F410 Panic disorder [episodic paroxysmal anxiety] without agoraphobia: Secondary | ICD-10-CM

## 2021-09-05 LAB — CMP (CANCER CENTER ONLY)
ALT: 48 U/L — ABNORMAL HIGH (ref 0–44)
AST: 29 U/L (ref 15–41)
Albumin: 4.1 g/dL (ref 3.5–5.0)
Alkaline Phosphatase: 61 U/L (ref 38–126)
Anion gap: 9 (ref 5–15)
BUN: 11 mg/dL (ref 6–20)
CO2: 22 mmol/L (ref 22–32)
Calcium: 9 mg/dL (ref 8.9–10.3)
Chloride: 108 mmol/L (ref 98–111)
Creatinine: 1.03 mg/dL (ref 0.61–1.24)
GFR, Estimated: >60 mL/min (ref >60)
Glucose, Bld: 112 mg/dL — ABNORMAL HIGH (ref 70–99)
Potassium: 3.9 mmol/L (ref 3.5–5.1)
Sodium: 139 mmol/L (ref 135–145)
Total Bilirubin: 0.7 mg/dL (ref 0.3–1.2)
Total Protein: 7.2 g/dL (ref 6.5–8.1)

## 2021-09-05 LAB — CBC WITH DIFFERENTIAL (CANCER CENTER ONLY)
Abs Immature Granulocytes: 0.03 10*3/uL (ref 0.00–0.07)
Basophils Absolute: 0 10*3/uL (ref 0.0–0.1)
Basophils Relative: 1 %
Eosinophils Absolute: 0.1 10*3/uL (ref 0.0–0.5)
Eosinophils Relative: 1 %
HCT: 43.1 % (ref 39.0–52.0)
Hemoglobin: 14.7 g/dL (ref 13.0–17.0)
Immature Granulocytes: 0 %
Lymphocytes Relative: 20 %
Lymphs Abs: 1.3 10*3/uL (ref 0.7–4.0)
MCH: 29.8 pg (ref 26.0–34.0)
MCHC: 34.1 g/dL (ref 30.0–36.0)
MCV: 87.2 fL (ref 80.0–100.0)
Monocytes Absolute: 0.4 10*3/uL (ref 0.1–1.0)
Monocytes Relative: 5 %
Neutro Abs: 4.9 10*3/uL (ref 1.7–7.7)
Neutrophils Relative %: 73 %
Platelet Count: 224 10*3/uL (ref 150–400)
RBC: 4.94 MIL/uL (ref 4.22–5.81)
RDW: 14.1 % (ref 11.5–15.5)
WBC Count: 6.8 10*3/uL (ref 4.0–10.5)
nRBC: 0 % (ref 0.0–0.2)

## 2021-09-05 LAB — TSH: TSH: 2.514 u[IU]/mL (ref 0.320–4.118)

## 2021-09-05 NOTE — Progress Notes (Signed)
?[place] ?[phone]   [phone] ? ?PROGRESS NOTE ? ?Patient Care Team: ?[name], DO as PCP - General (Family Medicine) ?Izora Gala, MD as Consulting Physician (Otolaryngology) ?Eppie Gibson, MD as Attending Physician (Radiation Oncology) ?Leota Sauers, RN (Inactive) as Registered Nurse ?Orson Slick, MD as Consulting Physician (Hematology and Oncology) ?Sharen Counter, CCC-SLP as Psychologist, clinical (Speech Pathology) ?Karie Mainland, RD as Dietitian (Nutrition) ?Wynelle Beckmann, Melodie Bouillon, PT as Physical Therapist (Physical Therapy) ?Kennith Center, LCSW as Social Worker ?Malmfelt, Stephani Police, RN as Oncology Nurse Navigator (Oncology) ? 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 CT scan performed showing 4.1 x 4.0 x 5.3 cm mass centered at the right palatine tonsil, highly concerning for primary head and neck malignancy, likely squamous cell carcinoma, additionally there were partially necrotic right level II and III adenopathy. ?2) 04/26/2019: referred to ENT, underwent right tonsillar biopsy confirming invasive squamous cell cancer ?3) 05/17/2019: PET CT scan showed hypermetabolic mass at right palantine tonsil and hypermetabolic right level 2 and 3 lymph nodes. There was also mildly hypermetabolic lymph node in the left level 2 neck.  ?4) 05/24/2019: establish care with Dr. Lorenso Courier  ?5) 06/07/2019: started radiation therapy with Dr. Isidore Moos ?6) 06/16/2019: Started Cisplatin '40mg'$ /m2 with concurrent radiation ?7) 06/23/2019: Week 2 cisplatin '40mg'$ /m2 with concurrent radiation ?9) 06/30/2019: Week 3 cisplatin '40mg'$ /m2 with concurrent radiation ?10) 07/07/2019: Week 4 cisplatin '40mg'$ /m2 with concurrent radiation ?11) 07/14/2019: Week 5 cisplatin '40mg'$ /m2 with concurrent radiation ?12) 07/21/2019: Week 6 cisplatin '40mg'$ /m2 with  concurrent radiation ?13) 07/25/2019: Completion of Chemoradiation.  ?14) 10/16/2019:  PET CT showed complete metabolic response to treatment with no residual tumor or adenopathy ? ?Interval History:  ?Eric Weaver 51 y.o. male with medical history significant for carcinoma of the tonsillar fossa  presents for a follow up visit. The patient's last visit was on 02/13/2021. In the interim he has had no changes in his health.  ? ?On exam today Eric Weaver notes he has been well overall in the interim since her last visit.  He reports he did have an episode of Bell's palsy where he had paralysis on the right side of his face.  He thinks he may have had an infection that caused this.  Fortunately he is not having any trouble with this anymore and the symptoms are fully resolved.  He is also unfortunately having some clicking occurring in his right ear and this is going on for several months.  He notes this has not been particularly bad for annoying.  He reports that he has not been having any difficulty with swallowing or throat pain.  His weight has continued to increase though he is interested in losing approximately 50 pounds from where he is now.  He has otherwise been well with no issues of fevers, chills, sweats, nausea, vomiting or diarrhea.  A full 10 point ROS is listed below.  The only other request the patient had was for his port to be removed today. ? ?MEDICAL HISTORY:  ?Past Medical History:  ?Diagnosis Date  ? Depression   ? Essential hypertension 08/02/2018  ? GERD (gastroesophageal reflux disease)   ? History of nephrolithiasis   ? Vitamin D deficiency   ? ? ?SURGICAL HISTORY: ?Past Surgical History:  ?Procedure Laterality Date  ? Biopsy of right tonsil Right 04/24/2019  ? Positive for squamous  cell carcinoma  ? IR GASTROSTOMY TUBE MOD SED  06/06/2019  ? IR GASTROSTOMY TUBE REMOVAL  10/06/2019  ? IR IMAGING GUIDED PORT INSERTION  06/06/2019  ? IR REMOVAL TUN ACCESS W/ PORT W/O FL MOD SED  11/24/2019   ? ? ?ALLERGIES:  has No Known Allergies. ? ?MEDICATIONS:  ?Current Outpatient Medications  ?Medication Sig Dispense Refill  ? omeprazole (PRILOSEC) 20 MG capsule Take 1 capsule by mouth daily.    ? LORazepam (ATIVAN) 0.5 MG tablet Take 1-2 tablets (0.5-1 mg total) by mouth daily as needed for anxiety (panic). 60 tablet 3  ? sertraline (ZOLOFT) 50 MG tablet TAKE 1 TABLET BY MOUTH ONCE DAILY . APPOINTMENT REQUIRED FOR FUTURE REFILLS 30 tablet 0  ? tadalafil (CIALIS) 5 MG tablet Take 1 tablet (5 mg total) by mouth daily. 90 tablet 3  ? Vitamin D, Ergocalciferol, (DRISDOL) 1.25 MG (50000 UNIT) CAPS capsule TAKE 1 CAPSULE BY MOUTH ONE TIME PER WEEK 12 capsule 0  ? ?No current facility-administered medications for this visit.  ? ? ?REVIEW OF SYSTEMS:   ?Constitutional: ( - ) fevers, ( - )  chills , ( - ) night sweats ?Eyes: ( - ) blurriness of vision, ( - ) double vision, ( - ) watery eyes ?Ears, nose, mouth, throat, and face: ( + ) mucositis, ( + ) sore throat ?Respiratory: ( + ) cough, ( - ) dyspnea, ( - ) wheezes ?Cardiovascular: ( - ) palpitation, ( - ) chest discomfort, ( - ) lower extremity swelling ?Gastrointestinal:  ( - ) nausea, ( - ) heartburn, ( - ) change in bowel habits ?Skin: ( - ) abnormal skin rashes ?Lymphatics: ( - ) new lymphadenopathy, ( - ) easy bruising ?Neurological: ( - ) numbness, ( - ) tingling, ( - ) new weaknesses ?Behavioral/Psych: ( - ) mood change, ( - ) new changes  ?All other systems were reviewed with the patient and are negative. ? ?PHYSICAL EXAMINATION: ?ECOG PERFORMANCE STATUS: 1 - Symptomatic but completely ambulatory ? ?Vitals:  ? 09/05/21 0933  ?BP: 127/76  ?Pulse: 79  ?Temp: 97.9 ?F (36.6 ?C)  ?SpO2: 99%  ? ?Filed Weights  ? 09/05/21 0933  ?Weight: 293 lb 3.2 oz (133 kg)  ? ? ?GENERAL: well appearing obese Caucasian male in NAD  ?SKIN: skin color, texture, turgor are normal, no rashes or significant lesions ?EYES: conjunctiva are pink and non-injected, sclera clear ?NECK: normal  thyroid, no lymphadenopathy.  ?LUNGS: clear to auscultation and percussion with normal breathing effort ?HEART: regular rate & rhythm and no murmurs and no lower extremity edema ?Musculoskeletal: no cyanosis of digits and no clubbing  ?PSYCH: alert & oriented x 3, fluent speech ?NEURO: no focal motor/sensory deficits ? ?LABORATORY DATA:  ?I have reviewed the data as listed ? ?  Latest Ref Rng & Units 09/05/2021  ?  9:24 AM 04/08/2021  ?  4:34 PM 02/13/2021  ?  3:51 PM  ?CBC  ?WBC 4.0 - 10.5 K/uL 6.8   6.6   5.5    ?Hemoglobin 13.0 - 17.0 g/dL 14.7   14.5   14.3    ?Hematocrit 39.0 - 52.0 % 43.1   41.8   41.7    ?Platelets 150 - 400 K/uL 224   260   228    ? ? ? ?  Latest Ref Rng & Units 09/05/2021  ?  9:24 AM 04/08/2021  ?  4:34 PM 02/13/2021  ?  3:51 PM  ?CMP  ?Glucose 70 -  99 mg/dL 112   98   109    ?BUN 6 - 20 mg/dL '11   11   12    '$ ?Creatinine 0.61 - 1.24 mg/dL 1.03   0.85   1.01    ?Sodium 135 - 145 mmol/L 139   139   141    ?Potassium 3.5 - 5.1 mmol/L 3.9   4.3   4.2    ?Chloride 98 - 111 mmol/L 108   102   105    ?CO2 22 - 32 mmol/L '22   26   27    '$ ?Calcium 8.9 - 10.3 mg/dL 9.0   9.3   9.4    ?Total Protein 6.5 - 8.1 g/dL 7.2   7.2   7.6    ?Total Bilirubin 0.3 - 1.2 mg/dL 0.7   0.4   0.5    ?Alkaline Phos 38 - 126 U/L 61   66   70    ?AST 15 - 41 U/L 29   49   69    ?ALT 0 - 44 U/L 48   74   100    ? ? ?RADIOGRAPHIC STUDIES: ?No results found. ? ?ASSESSMENT & PLAN ?Eric Weaver 51 y.o. male with medical history significant for carcinoma of the tonsillar fossa  presents for a follow up visit.  He has successfully completed his chemoradiation therapy with the last dose of radiation administered on 07/25/2019.  PET/CT on 10/17/2019 showed complete metabolic response to treatment with no residual mass or adenopathy. ? ?Patient is to undergo periodic surveillance exams with ENT. These should occur at least every 3 months in the first year and every 6 months in the second year, per NCCN guidelines. Oncology will take a  more peripheral role and follow Eric Weaver every 6 months or sooner if symptoms develop. ? ?# Carcinoma of the Tonsillar Fossa (Stage II, T3N2M0 p16+) ?--completed Chemoradiation therapy with cisplatin '40mg'$ /m2 on 07/25/2019.

## 2021-09-06 LAB — T4: T4, Total: 6.2 ug/dL (ref 4.5–12.0)

## 2021-09-08 ENCOUNTER — Telehealth: Payer: Self-pay | Admitting: Hematology and Oncology

## 2021-09-08 NOTE — Telephone Encounter (Signed)
Per 4/7 los. Called pt and left a message with details of appointment.  Left call back number if any changes are needed  ?

## 2021-10-07 ENCOUNTER — Encounter: Payer: Self-pay | Admitting: Family Medicine

## 2021-10-07 ENCOUNTER — Ambulatory Visit (INDEPENDENT_AMBULATORY_CARE_PROVIDER_SITE_OTHER): Payer: 59 | Admitting: Family Medicine

## 2021-10-07 ENCOUNTER — Other Ambulatory Visit: Payer: Self-pay

## 2021-10-07 VITALS — BP 136/83 | HR 74 | Temp 97.5°F | Ht 72.0 in | Wt 295.0 lb

## 2021-10-07 DIAGNOSIS — F411 Generalized anxiety disorder: Secondary | ICD-10-CM

## 2021-10-07 DIAGNOSIS — Z23 Encounter for immunization: Secondary | ICD-10-CM

## 2021-10-07 DIAGNOSIS — Z0001 Encounter for general adult medical examination with abnormal findings: Secondary | ICD-10-CM

## 2021-10-07 DIAGNOSIS — Z0000 Encounter for general adult medical examination without abnormal findings: Secondary | ICD-10-CM

## 2021-10-07 DIAGNOSIS — Z20828 Contact with and (suspected) exposure to other viral communicable diseases: Secondary | ICD-10-CM | POA: Diagnosis not present

## 2021-10-07 DIAGNOSIS — Z125 Encounter for screening for malignant neoplasm of prostate: Secondary | ICD-10-CM | POA: Diagnosis not present

## 2021-10-07 DIAGNOSIS — Z1211 Encounter for screening for malignant neoplasm of colon: Secondary | ICD-10-CM | POA: Diagnosis not present

## 2021-10-07 DIAGNOSIS — F331 Major depressive disorder, recurrent, moderate: Secondary | ICD-10-CM

## 2021-10-07 DIAGNOSIS — E559 Vitamin D deficiency, unspecified: Secondary | ICD-10-CM

## 2021-10-07 DIAGNOSIS — K219 Gastro-esophageal reflux disease without esophagitis: Secondary | ICD-10-CM

## 2021-10-07 DIAGNOSIS — F410 Panic disorder [episodic paroxysmal anxiety] without agoraphobia: Secondary | ICD-10-CM

## 2021-10-07 DIAGNOSIS — Z8669 Personal history of other diseases of the nervous system and sense organs: Secondary | ICD-10-CM

## 2021-10-07 DIAGNOSIS — I10 Essential (primary) hypertension: Secondary | ICD-10-CM

## 2021-10-07 LAB — BAYER DCA HB A1C WAIVED: HB A1C (BAYER DCA - WAIVED): 5 % (ref 4.8–5.6)

## 2021-10-07 MED ORDER — SERTRALINE HCL 100 MG PO TABS: 100.0000 mg | ORAL_TABLET | Freq: Every day | ORAL | 3 refills | Status: DC

## 2021-10-07 MED ORDER — TADALAFIL 5 MG PO TABS: 5.0000 mg | ORAL_TABLET | Freq: Every day | ORAL | 3 refills | Status: DC

## 2021-10-07 MED ORDER — OMEPRAZOLE 20 MG PO CPDR: 20.0000 mg | DELAYED_RELEASE_CAPSULE | Freq: Every day | ORAL | 3 refills | Status: DC

## 2021-10-07 MED ORDER — VITAMIN D (ERGOCALCIFEROL) 1.25 MG (50000 UNIT) PO CAPS: ORAL_CAPSULE | ORAL | 3 refills | Status: DC

## 2021-10-07 MED ORDER — LORAZEPAM 0.5 MG PO TABS: 0.5000 mg | ORAL_TABLET | Freq: Every day | ORAL | 3 refills | Status: DC | PRN

## 2021-10-07 NOTE — Patient Instructions (Signed)
Small meals ?AVOID heavy meals (high in carbs/ fried foods) ?DRINK LOTS OF WATER ?Avoid carbonated beverages ?$0 coupon can be found at:  ?[email or web address].html ? ?Here is a guide to help [place] find out which weight loss medications will be covered by your insurance plan.  ?Please check out this web site  [employer] and follow the 3 simple steps.  ? ?There is also a phone number you can call if you do not have access to the Internet. [phone] (Monday- Friday 8am-8pm) ? ?[employer] provides coverage information for more than 80% of the inquiries submitted!! ? ?

## 2021-10-07 NOTE — Progress Notes (Signed)
? ?Eric Weaver is a 51 y.o. male presents to office today for annual physical exam examination.   ? ?Concerns today include: ?1.  Morbid obesity ?Patient is interested in a 50 pound weight loss.  He has tried Plenity over-the-counter but found this difficult to use when he was working because you have to take it 30 minutes prior to meal.  He has tried modification of diet, increase physical exercise but is not having much success as he often does not have enough energy to even complete normal tasks at home.  He is very interested in pursuing pharmacologic intervention.  Not sure what his insurance covers ? ?2.  Anxiety and depression ?Patient reports ongoing anxiety and depressive symptoms.  He is advanced his Zoloft to 100 mg daily because the 50 mg was not effective.  He has found this to be more efficacious than the Lexapro and if he misses a dose he does not feel as bad with the Zoloft.  He continues to use the Ativan very sparingly.  Does not report any excessive daytime sedation, falls or respiratory depression.  Essentially has 4 children at home and stress at work really would exacerbate his symptoms. ? 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 Influenza,inj,Quad PF,6+ Mos 08/10/2019, 04/08/2021  ? Influenza,inj,quad, With Preservative 03/10/2018  ? Moderna Sars-Covid-2 Vaccination 09/07/2019, 10/05/2019  ? Tdap 07/15/2020  ? ? ? ?Past Medical History:  ?Diagnosis Date  ? Depression   ? Essential hypertension 08/02/2018  ? GERD (gastroesophageal reflux disease)   ? History of nephrolithiasis   ? Vitamin D deficiency   ? ?Social History  ? ?Socioeconomic History  ? Marital status: Married  ?  Spouse name: Not on file  ? Number of children: Not on file  ? Years of education: Not  on file  ? Highest education level: Not on file  ?Occupational History  ? Not on file  ?Tobacco Use  ? Smoking status: Never  ? Smokeless tobacco: Never  ?Vaping Use  ? Vaping Use: Never used  ?Substance and Sexual Activity  ? Alcohol use: Yes  ?  Comment: occasional  ? Drug use: No  ? Sexual activity: Not Currently  ?Other Topics Concern  ? Not on file  ?Social History Narrative  ? Not on file  ? ?Social Determinants of Health  ? ?Financial Resource Strain: Not on file  ?Food Insecurity: Not on file  ?Transportation Needs: Not on file  ?Physical Activity: Not on file  ?Stress: Not on file  ?Social Connections: Not on file  ?Intimate Partner Violence: Not on file  ? ?Past Surgical History:  ?Procedure Laterality Date  ? Biopsy of right tonsil Right 04/24/2019  ? Positive for squamous cell carcinoma  ? IR GASTROSTOMY TUBE MOD SED  06/06/2019  ? IR GASTROSTOMY TUBE REMOVAL  10/06/2019  ? IR IMAGING GUIDED PORT INSERTION  06/06/2019  ? IR REMOVAL TUN ACCESS W/ PORT W/O FL MOD SED  11/24/2019  ? ?Family History  ?Problem Relation Age of Onset  ? Hypertension Mother   ? Heart attack Father   ? ? ?Current Outpatient Medications:  ?  LORazepam (ATIVAN) 0.5 MG tablet, Take 1-2 tablets (0.5-1 mg total) by mouth daily as needed for anxiety (panic)., Disp: 60 tablet, Rfl: 3 ?  omeprazole (PRILOSEC) 20 MG capsule, Take 1 capsule by  mouth daily., Disp: , Rfl:  ?  sertraline (ZOLOFT) 50 MG tablet, TAKE 1 TABLET BY MOUTH ONCE DAILY . APPOINTMENT REQUIRED FOR FUTURE REFILLS, Disp: 30 tablet, Rfl: 0 ?  tadalafil (CIALIS) 5 MG tablet, Take 1 tablet (5 mg total) by mouth daily., Disp: 90 tablet, Rfl: 3 ?  Vitamin D, Ergocalciferol, (DRISDOL) 1.25 MG (50000 UNIT) CAPS capsule, TAKE 1 CAPSULE BY MOUTH ONE TIME PER WEEK, Disp: 12 capsule, Rfl: 0 ? ?No Known Allergies  ? ?ROS: ?Review of Systems ?Pertinent items noted in HPI and remainder of comprehensive ROS otherwise negative.   ? ?Physical exam ?BP 136/83   Pulse 74   Temp (!) 97.5 ?F (36.4  ?C)   Ht 6' (1.829 m)   Wt 295 lb (133.8 kg)   SpO2 98%   BMI 40.01 kg/m?  ?General appearance: alert, cooperative, appears stated age, no distress, and morbidly obese ?Head: Normocephalic, without obvious abnormality, atraumatic ?Eyes: negative findings: lids and lashes normal, conjunctivae and sclerae normal, corneas clear, and pupils equal, round, reactive to light and accomodation ?Ears: normal TM's and external ear canals both ears ?Nose: Nares normal. Septum midline. Mucosa normal. No drainage or sinus tenderness. ?Throat: lips, mucosa, and tongue normal; teeth and gums normal ?Neck: no adenopathy, no carotid bruit, and thyroid not enlarged, symmetric, no tenderness/mass/nodules ?Back: symmetric, no curvature. ROM normal. No CVA tenderness. ?Lungs: clear to auscultation bilaterally ?Chest wall: no tenderness ?Heart: regular rate and rhythm, S1, S2 normal, no murmur, click, rub or gallop ?Abdomen: soft, non-tender; bowel sounds normal; no masses,  no organomegaly and obese ?Extremities: extremities normal, atraumatic, no cyanosis or edema and has hemosiderin deposition along anterior shins bilaterally ?Pulses: 2+ and symmetric ?Skin:  Hemosiderin changes as above ?Lymph nodes: Cervical, supraclavicular, and axillary nodes normal. ?Neurologic: Grossly normal ?Psych: Appears somewhat stressed ? ? ?  10/07/2021  ?  9:16 AM 04/08/2021  ?  4:05 PM 03/14/2020  ?  3:55 PM  ?Depression screen PHQ 2/9  ?Decreased Interest _0 ?Down, Depressed, Hopeless _1 ?PHQ - 2 Score _2 ?Altered sleeping 2 0 1  ?Tired, decreased energy _3 ?Change in appetite 0 0 0  ?Feeling bad or failure about yourself  0 0 0  ?Trouble concentrating 1  1  ?Moving slowly or fidgety/restless 0 0 0  ?Suicidal thoughts 0 0 0  ?PHQ-9 Score _4 ?Difficult doing work/chores Somewhat difficult  Not difficult at all  ? ? ?  10/07/2021  ?  9:16 AM 04/08/2021  ?  4:05 PM 07/15/2020  ?  3:32 PM 03/14/2020  ?  3:56 PM  ?GAD 7 : Generalized Anxiety  Score  ?Nervous, Anxious, on Edge _5 ?Control/stop worrying 2 0 0 1  ?Worry too much - different things 2 0 0 1  ?Trouble relaxing 2 1 0 1  ?Restless 1 0 0 0  ?Easily annoyed or irritable _6 ?Afraid - awful might happen 0 0 0 0  ?Total GAD 7 Score _7 ?Anxiety Difficulty Somewhat difficult Somewhat difficult Somewhat difficult Not difficult at all  ? ? ? ?Assessment/ Plan: ?Eric Weaver here for annual physical exam.  ? ?Annual physical exam ? ?Colon cancer screening - Plan: Ambulatory referral to Gastroenterology ? ?Screening for malignant neoplasm of prostate - Plan: PSA ? ?Generalized anxiety disorder with panic attacks - Plan: LORazepam (ATIVAN) 0.5 MG tablet, sertraline (  ZOLOFT) 100 MG tablet ? ?Moderate episode of recurrent major depressive disorder ([place]) - Plan: sertraline (ZOLOFT) 100 MG tablet ? ?Morbid obesity ([place]) - Plan: CMP14+EGFR, Lipid Panel, Bayer DCA Hb A1c Waived ? ?Gastroesophageal reflux disease without esophagitis - Plan: omeprazole (PRILOSEC) 20 MG capsule ? ?Essential hypertension ? ?Vitamin D deficiency - Plan: VITAMIN D 25 Hydroxy (Vit-D Deficiency, Fractures), Vitamin D, Ergocalciferol, (DRISDOL) 1.25 MG (50000 UNIT) CAPS capsule ? ?Mono exposure - Plan: Mononucleosis Test, Qual W/ Reflex ? ?History of Bell's palsy ? ?We will get patient referred to gastroenterology for colon cancer screening.  We discussed that given his personal history of tonsillar cancer I would prefer him having a colonoscopy over Cologuard.  He is in agreement.  Having no red flag signs or symptoms.  Referral to [facility] placed.   ? ?First shingles vaccination administered ? ?Screening for prostate neoplasm.  Having no concerning symptoms or signs. ? ?Anxiety disorder is not well controlled.  Advance Zoloft to 100 mg.  Ativan renewed for as needed use.  Up-to-date on controlled substance contract and drug screen.  The national narcotic database reviewed and there were no red flags.  Postdated  prescription sent to pharmacy. ? ?We discussed options including Wegovy and Saxenda for treatment of morbid obesity.  Unsure if his insurance will cover this so he will check into it and get back to me.

## 2021-10-08 ENCOUNTER — Encounter: Payer: Self-pay | Admitting: Hematology and Oncology

## 2021-10-08 ENCOUNTER — Other Ambulatory Visit: Payer: Self-pay | Admitting: Family Medicine

## 2021-10-08 DIAGNOSIS — R972 Elevated prostate specific antigen [PSA]: Secondary | ICD-10-CM

## 2021-10-08 DIAGNOSIS — R7989 Other specified abnormal findings of blood chemistry: Secondary | ICD-10-CM

## 2021-10-08 LAB — CMP14+EGFR
ALT: 55 IU/L — ABNORMAL HIGH (ref 0–44)
AST: 42 IU/L — ABNORMAL HIGH (ref 0–40)
Albumin/Globulin Ratio: 1.6 (ref 1.2–2.2)
Albumin: 4.6 g/dL (ref 3.8–4.9)
Alkaline Phosphatase: 68 IU/L (ref 44–121)
BUN/Creatinine Ratio: 13 (ref 9–20)
BUN: 13 mg/dL (ref 6–24)
Bilirubin Total: 0.4 mg/dL (ref 0.0–1.2)
CO2: 22 mmol/L (ref 20–29)
Calcium: 9.6 mg/dL (ref 8.7–10.2)
Chloride: 103 mmol/L (ref 96–106)
Creatinine, Ser: 1 mg/dL (ref 0.76–1.27)
Globulin, Total: 2.9 g/dL (ref 1.5–4.5)
Glucose: 110 mg/dL — ABNORMAL HIGH (ref 70–99)
Potassium: 4.8 mmol/L (ref 3.5–5.2)
Sodium: 140 mmol/L (ref 134–144)
Total Protein: 7.5 g/dL (ref 6.0–8.5)
eGFR: 91 mL/min/{1.73_m2} (ref >59)

## 2021-10-08 LAB — LIPID PANEL
Chol/HDL Ratio: 5.5 ratio — ABNORMAL HIGH (ref 0.0–5.0)
Cholesterol, Total: 219 mg/dL — ABNORMAL HIGH (ref 100–199)
HDL: 40 mg/dL (ref >39)
LDL Chol Calc (NIH): 157 mg/dL — ABNORMAL HIGH (ref 0–99)
Triglycerides: 120 mg/dL (ref 0–149)
VLDL Cholesterol Cal: 22 mg/dL (ref 5–40)

## 2021-10-08 LAB — PSA: Prostate Specific Ag, Serum: 6.8 ng/mL — ABNORMAL HIGH (ref 0.0–4.0)

## 2021-10-08 LAB — MONO QUAL W/RFLX QN: Mono Qual W/Rflx Qn: NEGATIVE

## 2021-10-08 LAB — VITAMIN D 25 HYDROXY (VIT D DEFICIENCY, FRACTURES): Vit D, 25-Hydroxy: 29.4 ng/mL — ABNORMAL LOW (ref 30.0–100.0)

## 2021-10-09 ENCOUNTER — Encounter: Payer: Self-pay | Admitting: Family Medicine

## 2021-10-09 LAB — SPECIMEN STATUS REPORT

## 2021-10-09 LAB — TESTOSTERONE: Testosterone: 251 ng/dL — ABNORMAL LOW (ref 264–916)

## 2021-10-10 ENCOUNTER — Encounter: Payer: Self-pay | Admitting: Emergency Medicine

## 2021-12-22 ENCOUNTER — Other Ambulatory Visit: Payer: Self-pay

## 2021-12-25 ENCOUNTER — Other Ambulatory Visit: Payer: Self-pay

## 2022-01-07 ENCOUNTER — Encounter: Payer: Self-pay | Admitting: Family Medicine

## 2022-01-07 ENCOUNTER — Ambulatory Visit: Payer: 59 | Admitting: Family Medicine

## 2022-01-07 DIAGNOSIS — R972 Elevated prostate specific antigen [PSA]: Secondary | ICD-10-CM

## 2022-01-07 DIAGNOSIS — Z23 Encounter for immunization: Secondary | ICD-10-CM | POA: Diagnosis not present

## 2022-01-07 DIAGNOSIS — K219 Gastro-esophageal reflux disease without esophagitis: Secondary | ICD-10-CM

## 2022-01-07 DIAGNOSIS — R7989 Other specified abnormal findings of blood chemistry: Secondary | ICD-10-CM | POA: Diagnosis not present

## 2022-01-07 MED ORDER — OMEPRAZOLE 20 MG PO CPDR: 20.0000 mg | DELAYED_RELEASE_CAPSULE | Freq: Two times a day (BID) | ORAL | 3 refills | Status: DC | PRN

## 2022-01-07 MED ORDER — SEMAGLUTIDE(0.25 OR 0.5MG/DOS) 2 MG/1.5ML ~~LOC~~ SOPN: PEN_INJECTOR | SUBCUTANEOUS | 0 refills | Status: DC

## 2022-01-07 NOTE — Patient Instructions (Signed)
Come in for an early morning (before 10am) blood draw.  Your levels were abnormal last visit and these need to be rechecked.  Tips for success with your weight loss medication (and by success, how not to be super sick on your stomach): Eat small meals AVOID heavy foods (fried/ high in carbs like bread, pasta, rice) AVOID carbonated beverages (soda/ beer, as these can increase bloating) DOUBLE your water intake (will help you avoid constipation/ dehydration)  This class of medication CAN cause: Nausea Abdominal pain Increased acid reflux (sometimes presents as "sour burps") Constipation OR Diarrhea Fatigue (especially when you first start it)

## 2022-01-07 NOTE — Progress Notes (Signed)
Subjective: WE:[id number] PCP: [name], DO CVE:LFYBOFB Eric Weaver is a 51 y.o. male presenting to clinic today for:  1. Morbid obesity Has been taking Super Beets and effort to improve overall health.  He is still very interested in GLP class for weight loss management but his insurance will not cover any of the GLP's.  He has no apparent contraindications to use of the medicine.  Does have acid reflux which sometimes does flareup despite daily use of PPI.  This does seem to be dependent upon what he is eating.  He has tried going without the PPI but cannot do that.   ROS: Per HPI  No Known Allergies Past Medical History:  Diagnosis Date   Depression    Essential hypertension 08/02/2018   GERD (gastroesophageal reflux disease)    History of nephrolithiasis    Vitamin D deficiency     Current Outpatient Medications:    LORazepam (ATIVAN) 0.5 MG tablet, Take 1-2 tablets (0.5-1 mg total) by mouth daily as needed for anxiety (panic (put on file))., Disp: 60 tablet, Rfl: 3   omeprazole (PRILOSEC) 20 MG capsule, Take 1 capsule (20 mg total) by mouth daily., Disp: 90 capsule, Rfl: 3   sertraline (ZOLOFT) 100 MG tablet, Take 1 tablet (100 mg total) by mouth daily., Disp: 90 tablet, Rfl: 3   tadalafil (CIALIS) 5 MG tablet, Take 1 tablet (5 mg total) by mouth daily., Disp: 90 tablet, Rfl: 3   Vitamin D, Ergocalciferol, (DRISDOL) 1.25 MG (50000 UNIT) CAPS capsule, TAKE 1 CAPSULE BY MOUTH ONE TIME PER WEEK, Disp: 12 capsule, Rfl: 3 Social History   Socioeconomic History   Marital status: Married    Spouse name: Not on file   Number of children: Not on file   Years of education: Not on file   Highest education level: Not on file  Occupational History   Not on file  Tobacco Use   Smoking status: Never   Smokeless tobacco: Never  Vaping Use   Vaping Use: Never used  Substance and Sexual Activity   Alcohol use: Yes    Comment: occasional   Drug use: No   Sexual activity: Not  Currently  Other Topics Concern   Not on file  Social History Narrative   Not on file   Social Determinants of Health   Financial Resource Strain: Not on file  Food Insecurity: Not on file  Transportation Needs: Not on file  Physical Activity: Not on file  Stress: Not on file  Social Connections: Not on file  Intimate Partner Violence: Not on file   Family History  Problem Relation Age of Onset   Hypertension Mother    Heart attack Father     Objective: Office vital signs reviewed. BP 132/82   Pulse 68   Temp 97.8 F (36.6 C)   Ht 6' (1.829 m)   Wt 296 lb 3.2 oz (134.4 kg)   SpO2 98%   BMI 40.17 kg/m   Physical Examination:  General: Awake, alert, morbidly obese male, No acute distress HEENT: Sclera white.  Moist mucous membranes Cardio: regular rate and rhythm, S1S2 heard, no murmurs appreciated Pulm: clear to auscultation bilaterally, no wheezes, rhonchi or rales; normal work of breathing on room air  Assessment/ Plan: 51 y.o. male   Morbid obesity ([place])  Low testosterone in male  Elevated PSA, less than 10 ng/ml  Gastroesophageal reflux disease without esophagitis - Plan: omeprazole (PRILOSEC) 20 MG capsule  I think that he is a  good candidate for the GLP class so I did offer him a prescription to our local compounding pharmacy for the sublingual version.  He is amenable to this and I have sent the Rx.  Discussed troubleshooting with medications, things to avoid.  Encourage p.o. hydration  He will return at a different date early in the morning to have testosterone, PSA levels redrawn as these were abnormal on his last visit  PPI increased to twice daily dosing but advised to use this only if needed as this is associated with vitamin deficiency, renal dysfunction etc.  He voiced good understanding  No orders of the defined types were placed in this encounter.  No orders of the defined types were placed in this encounter.  Second shingles vaccination  administered  [name], [employer] [phone]

## 2022-01-09 ENCOUNTER — Other Ambulatory Visit: Payer: 59

## 2022-01-09 DIAGNOSIS — Z0000 Encounter for general adult medical examination without abnormal findings: Secondary | ICD-10-CM

## 2022-01-09 DIAGNOSIS — R7989 Other specified abnormal findings of blood chemistry: Secondary | ICD-10-CM

## 2022-01-09 DIAGNOSIS — R972 Elevated prostate specific antigen [PSA]: Secondary | ICD-10-CM

## 2022-01-10 LAB — HEPATIC FUNCTION PANEL
ALT: 47 IU/L — ABNORMAL HIGH (ref 0–44)
AST: 31 IU/L (ref 0–40)
Albumin: 4.4 g/dL (ref 3.8–4.9)
Alkaline Phosphatase: 67 IU/L (ref 44–121)
Bilirubin Total: 0.6 mg/dL (ref 0.0–1.2)
Bilirubin, Direct: 0.15 mg/dL (ref 0.00–0.40)
Total Protein: 7.1 g/dL (ref 6.0–8.5)

## 2022-01-10 LAB — ACUTE HEP PANEL AND HEP B SURFACE AB
Hep A IgM: NEGATIVE
Hep B C IgM: NEGATIVE
Hep C Virus Ab: NONREACTIVE
Hepatitis B Surf Ab Quant: <3.1 m[IU]/mL — ABNORMAL LOW (ref >9.9)
Hepatitis B Surface Ag: NEGATIVE

## 2022-01-10 LAB — PSA: Prostate Specific Ag, Serum: 2.3 ng/mL (ref 0.0–4.0)

## 2022-01-10 LAB — TESTOSTERONE: Testosterone: 242 ng/dL — ABNORMAL LOW (ref 264–916)

## 2022-02-01 ENCOUNTER — Other Ambulatory Visit: Payer: Self-pay | Admitting: Hematology and Oncology

## 2022-02-06 ENCOUNTER — Other Ambulatory Visit: Payer: Self-pay | Admitting: Family Medicine

## 2022-02-06 NOTE — Telephone Encounter (Signed)
Pt returned missed call. Says to disregard refill request because he did not request this from the pharmacy.

## 2022-02-06 NOTE — Telephone Encounter (Signed)
This hasn't been prescribed in years.  Is he ok?

## 2022-02-06 NOTE — Telephone Encounter (Signed)
lmtcb

## 2022-02-06 NOTE — Telephone Encounter (Signed)
Last OV 01/07/22. Last RF 2020. Next OV no upcoming appt scheduled

## 2022-02-09 ENCOUNTER — Telehealth: Payer: Self-pay | Admitting: *Deleted

## 2022-02-09 NOTE — Telephone Encounter (Signed)
RF request from [place] in through electronic refill errors Denied since did not match med list Request order details for Semaglutide Sublingual '1mg'$ /'1mg'$ /ml Susp Ingredients requested from pharmacy:   Rybelsus 14 mg tab total dispense qty 15 ea Stevia powder total dispense qty 0.6 g Saccharin Sodium powder total dispense 0.6 g Marsmallow flavor liquid total dispense qty 1 ml Peppermint oil total dispense qty 0.1 ml Suspendrx Anhydrous sweet susp total dispenst qty 210 ml Cyancobalamin powder total dispense qty 0.21 g  Sig: take 0.25 ml under tongue Q 7D, then increase to 0.5 ml daily or as directed by prescriber. Hold under tongue for several minutes before swallowing QTY 15 ml  Note sure if this can be e-prescribed if appropriate or have to be printed and faxed.

## 2022-02-09 NOTE — Telephone Encounter (Signed)
Spoke to pharmacy and she said on our end and their end everything is good- we do not need to do anything

## 2022-02-09 NOTE — Telephone Encounter (Signed)
Please verify this.  I sent in a written rx faxed that they have provided to [place].  They should have received.  I have not had to send the compounded ingredients, so think this may be a fluke.

## 2022-03-09 ENCOUNTER — Inpatient Hospital Stay: Payer: 59 | Admitting: Hematology and Oncology

## 2022-03-09 ENCOUNTER — Inpatient Hospital Stay: Payer: 59

## 2022-03-10 ENCOUNTER — Other Ambulatory Visit: Payer: Self-pay | Admitting: *Deleted

## 2022-03-10 DIAGNOSIS — C090 Malignant neoplasm of tonsillar fossa: Secondary | ICD-10-CM

## 2022-03-11 ENCOUNTER — Other Ambulatory Visit: Payer: Self-pay

## 2022-03-11 ENCOUNTER — Inpatient Hospital Stay: Payer: 59 | Attending: Physician Assistant

## 2022-03-11 ENCOUNTER — Inpatient Hospital Stay: Payer: 59 | Admitting: Physician Assistant

## 2022-03-11 DIAGNOSIS — C090 Malignant neoplasm of tonsillar fossa: Secondary | ICD-10-CM | POA: Insufficient documentation

## 2022-03-11 DIAGNOSIS — Z79899 Other long term (current) drug therapy: Secondary | ICD-10-CM | POA: Diagnosis not present

## 2022-03-11 DIAGNOSIS — I10 Essential (primary) hypertension: Secondary | ICD-10-CM | POA: Insufficient documentation

## 2022-03-11 DIAGNOSIS — Z9221 Personal history of antineoplastic chemotherapy: Secondary | ICD-10-CM | POA: Diagnosis not present

## 2022-03-11 DIAGNOSIS — Z923 Personal history of irradiation: Secondary | ICD-10-CM | POA: Insufficient documentation

## 2022-03-11 LAB — CBC WITH DIFFERENTIAL (CANCER CENTER ONLY)
Abs Immature Granulocytes: 0.01 10*3/uL (ref 0.00–0.07)
Basophils Absolute: 0 10*3/uL (ref 0.0–0.1)
Basophils Relative: 1 %
Eosinophils Absolute: 0.1 10*3/uL (ref 0.0–0.5)
Eosinophils Relative: 1 %
HCT: 41.7 % (ref 39.0–52.0)
Hemoglobin: 14.7 g/dL (ref 13.0–17.0)
Immature Granulocytes: 0 %
Lymphocytes Relative: 21 %
Lymphs Abs: 1.3 10*3/uL (ref 0.7–4.0)
MCH: 30.2 pg (ref 26.0–34.0)
MCHC: 35.3 g/dL (ref 30.0–36.0)
MCV: 85.6 fL (ref 80.0–100.0)
Monocytes Absolute: 0.3 10*3/uL (ref 0.1–1.0)
Monocytes Relative: 4 %
Neutro Abs: 4.6 10*3/uL (ref 1.7–7.7)
Neutrophils Relative %: 73 %
Platelet Count: 243 10*3/uL (ref 150–400)
RBC: 4.87 MIL/uL (ref 4.22–5.81)
RDW: 13.8 % (ref 11.5–15.5)
WBC Count: 6.3 10*3/uL (ref 4.0–10.5)
nRBC: 0 % (ref 0.0–0.2)

## 2022-03-11 LAB — CMP (CANCER CENTER ONLY)
ALT: 39 U/L (ref 0–44)
AST: 32 U/L (ref 15–41)
Albumin: 4.5 g/dL (ref 3.5–5.0)
Alkaline Phosphatase: 57 U/L (ref 38–126)
Anion gap: 6 (ref 5–15)
BUN: 14 mg/dL (ref 6–20)
CO2: 23 mmol/L (ref 22–32)
Calcium: 9.1 mg/dL (ref 8.9–10.3)
Chloride: 109 mmol/L (ref 98–111)
Creatinine: 1.03 mg/dL (ref 0.61–1.24)
GFR, Estimated: >60 mL/min (ref >60)
Glucose, Bld: 129 mg/dL — ABNORMAL HIGH (ref 70–99)
Potassium: 3.8 mmol/L (ref 3.5–5.1)
Sodium: 138 mmol/L (ref 135–145)
Total Bilirubin: 0.6 mg/dL (ref 0.3–1.2)
Total Protein: 7.7 g/dL (ref 6.5–8.1)

## 2022-03-11 LAB — TSH: TSH: 2.197 u[IU]/mL (ref 0.350–4.500)

## 2022-03-11 NOTE — Progress Notes (Signed)
[name] Telephone:([phone]) [phone]   [phone]) [phone]  PROGRESS NOTE  Patient Care Team: [name], DO as PCP - General (Family Medicine) Izora Gala, MD as Consulting Physician (Otolaryngology) Eppie Gibson, MD as Attending Physician (Radiation Oncology) Leota Sauers, RN (Inactive) as Registered Nurse Orson Slick, MD as Consulting Physician (Hematology and Oncology) Sharen Counter, CCC-SLP as Speech Language Pathologist (Speech Pathology) Karie Mainland, RD as Dietitian (Nutrition) Wynelle Beckmann, Melodie Bouillon, PT as Physical Therapist (Physical Therapy) Kennith Center, LCSW as Social Worker Malmfelt, Stephani Police, RN as Oncology Nurse Navigator (Oncology)  Hematological/Oncological History # 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 CT scan performed showing 4.1 x 4.0 x 5.3 cm mass centered at the right palatine tonsil, highly concerning for primary head and neck malignancy, likely squamous cell carcinoma, additionally there were partially necrotic right level II and III adenopathy. 2) 04/26/2019: referred to ENT, underwent right tonsillar biopsy confirming invasive squamous cell cancer 3) 05/17/2019: PET CT scan showed hypermetabolic mass at right palantine tonsil and hypermetabolic right level 2 and 3 lymph nodes. There was also mildly hypermetabolic lymph node in the left level 2 neck.  4) 05/24/2019: establish care with Dr. Lorenso Courier  5) 06/07/2019: started radiation therapy with Dr. Isidore Moos 6) 06/16/2019: Started Cisplatin '40mg'$ /m2 with concurrent radiation 7) 06/23/2019: Week 2 cisplatin '40mg'$ /m2 with concurrent radiation 9) 06/30/2019: Week 3 cisplatin '40mg'$ /m2 with concurrent radiation 10) 07/07/2019: Week 4 cisplatin '40mg'$ /m2 with concurrent radiation 11) 07/14/2019: Week 5 cisplatin '40mg'$ /m2 with concurrent radiation 12) 07/21/2019: Week 6 cisplatin '40mg'$ /m2 with  concurrent radiation 13) 07/25/2019: Completion of Chemoradiation.  14) 10/16/2019:  PET CT showed complete metabolic response to treatment with no residual tumor or adenopathy  Interval History:  Eric Weaver 51 y.o. male with medical history significant for carcinoma of the tonsillar fossa  presents for a follow up visit. The patient's last visit was on 09/05/2021. In the interim he has had no changes in his health.   On exam today [name] reports his energy levels are stable without any changes.  He is able to complete all his daily activities on his own.  He denies any nausea, vomiting or abdominal pain.  He reports that since completing radiation he does have sensitivity to spicy foods.  Additionally he does have some mild dysphagia with dry meats and bread.  He denies any regurgitation or dyne aphasia.  Patient denies any bowel habit changes including recurrent episodes of diarrhea or constipation.  He has lost another 13 pounds since August 2023 which has been intentional.  He denies easy bruising or signs of active bleeding.  He denies fevers, chills, night sweats, shortness of breath, chest pain or cough.  He has no other complaints.  Rest of the 10 point ROS is below.  MEDICAL HISTORY:  Past Medical History:  Diagnosis Date   Depression    Essential hypertension 08/02/2018   GERD (gastroesophageal reflux disease)    History of nephrolithiasis    Vitamin D deficiency     SURGICAL HISTORY: Past Surgical History:  Procedure Laterality Date   Biopsy of right tonsil Right 04/24/2019   Positive for squamous cell carcinoma   IR GASTROSTOMY TUBE MOD SED  06/06/2019   IR GASTROSTOMY TUBE REMOVAL  10/06/2019   IR IMAGING GUIDED PORT INSERTION  06/06/2019   IR REMOVAL TUN ACCESS W/ PORT W/O FL MOD SED  11/24/2019  ALLERGIES:  has No Known Allergies.  MEDICATIONS:  Current Outpatient Medications  Medication Sig Dispense Refill   LORazepam (ATIVAN) 0.5 MG tablet Take 1-2 tablets (0.5-1 mg  total) by mouth daily as needed for anxiety (panic (put on file)). 60 tablet 3   omeprazole (PRILOSEC) 20 MG capsule Take 1 capsule (20 mg total) by mouth 2 (two) times daily as needed. 180 capsule 3   Semaglutide,0.25 or 0.'5MG'$ /DOS, 2 MG/1.5ML SOPN Patient on oral semaglutide compound with B12 1 mL 0   sertraline (ZOLOFT) 100 MG tablet Take 1 tablet (100 mg total) by mouth daily. 90 tablet 3   tadalafil (CIALIS) 5 MG tablet Take 1 tablet (5 mg total) by mouth daily. 90 tablet 3   Vitamin D, Ergocalciferol, (DRISDOL) 1.25 MG (50000 UNIT) CAPS capsule TAKE 1 CAPSULE BY MOUTH ONE TIME PER WEEK 12 capsule 3   No current facility-administered medications for this visit.    REVIEW OF SYSTEMS:   Constitutional: ( - ) fevers, ( - )  chills , ( - ) night sweats Eyes: ( - ) blurriness of vision, ( - ) double vision, ( - ) watery eyes Ears, nose, mouth, throat, and face: ( + ) mucositis, ( + ) sore throat Respiratory: ( + ) cough, ( - ) dyspnea, ( - ) wheezes Cardiovascular: ( - ) palpitation, ( - ) chest discomfort, ( - ) lower extremity swelling Gastrointestinal:  ( - ) nausea, ( - ) heartburn, ( - ) change in bowel habits Skin: ( - ) abnormal skin rashes Lymphatics: ( - ) new lymphadenopathy, ( - ) easy bruising Neurological: ( - ) numbness, ( - ) tingling, ( - ) new weaknesses Behavioral/Psych: ( - ) mood change, ( - ) new changes  All other systems were reviewed with the patient and are negative.  PHYSICAL EXAMINATION: ECOG PERFORMANCE STATUS: 1 - Symptomatic but completely ambulatory  There were no vitals filed for this visit.  There were no vitals filed for this visit.   GENERAL: well appearing obese Caucasian male in NAD  SKIN: skin color, texture, turgor are normal, no rashes or significant lesions EYES: conjunctiva are pink and non-injected, sclera clear NECK: normal thyroid, no lymphadenopathy.  LUNGS: clear to auscultation and percussion with normal breathing effort HEART:  regular rate & rhythm and no murmurs and no lower extremity edema Musculoskeletal: no cyanosis of digits and no clubbing  PSYCH: alert & oriented x 3, fluent speech NEURO: no focal motor/sensory deficits  LABORATORY DATA:  I have reviewed the data as listed    Latest Ref Rng & Units 03/11/2022    2:23 PM 09/05/2021    9:24 AM 04/08/2021    4:34 PM  CBC  WBC 4.0 - 10.5 K/uL 6.3  6.8  6.6   Hemoglobin 13.0 - 17.0 g/dL 14.7  14.7  14.5   Hematocrit 39.0 - 52.0 % 41.7  43.1  41.8   Platelets 150 - 400 K/uL 243  224  260        Latest Ref Rng & Units 03/11/2022    2:23 PM 01/09/2022    8:07 AM 10/07/2021    9:43 AM  CMP  Glucose 70 - 99 mg/dL 129   110   BUN 6 - 20 mg/dL 14   13   Creatinine 0.61 - 1.24 mg/dL 1.03   1.00   Sodium 135 - 145 mmol/L 138   140   Potassium 3.5 - 5.1 mmol/L 3.8   4.8  Chloride 98 - 111 mmol/L 109   103   CO2 22 - 32 mmol/L 23   22   Calcium 8.9 - 10.3 mg/dL 9.1   9.6   Total Protein 6.5 - 8.1 g/dL 7.7  7.1  7.5   Total Bilirubin 0.3 - 1.2 mg/dL 0.6  0.6  0.4   Alkaline Phos 38 - 126 U/L 57  67  68   AST 15 - 41 U/L 32  31  42   ALT 0 - 44 U/L 39  47  55     RADIOGRAPHIC STUDIES: No results found.  ASSESSMENT & PLAN Eric Weaver 51 y.o. male with medical history significant for carcinoma of the tonsillar fossa  presents for a follow up visit.  He has successfully completed his chemoradiation therapy with the last dose of radiation administered on 07/25/2019.  PET/CT on 10/17/2019 showed complete metabolic response to treatment with no residual mass or adenopathy.  Patient is to undergo periodic surveillance exams with ENT. These should occur at least every 3 months in the first year and every 6 months in the second year, per NCCN guidelines. Oncology will take a more peripheral role and follow Mr. Olander every 6 months or sooner if symptoms develop.  # Carcinoma of the Tonsillar Fossa (Stage II, T3N2M0 p16+) --completed Chemoradiation therapy with  cisplatin '40mg'$ /m2 on 07/25/2019. Received 6 doses of cisplatin during the course of radiation.  --Hgb and Cr are stable.  -- PET CT on 10/16/2019 showed complete metabolic response to treatment with no residual mass or adenopathy. Per NCCN guidelines, there is no data to support routine imaging in an asymptomatic patient after a 12 week post treatment negative PET. Can image if new or concerning symptoms arise.  --Advised patient that he needs to follow up with ENT for routine surveillance, consisting of a mirror/ fibro-optic exam at least every 3 months for the 1st year, at least every 6 months for the subsequent 2 years. Patient will plan to follow up with Dr. Constance Holster.  --Thyroid level pending.  --No clinical signs of recurrence. Continue on surveillance.  --Plan for RTC in 6 months with labs  No orders of the defined types were placed in this encounter.   All questions were answered. The patient knows to call the clinic with any problems, questions or concerns.  I have spent a total of 25 minutes minutes of face-to-face and non-face-to-face time, preparing to see the patient,  performing a medically appropriate examination, counseling and educating the patient, documenting clinical information in the electronic health record, and care coordination.   Dede Query PA-C Dept of Hematology and [place] at [facility] Phone: [phone]  03/11/2022 3:43 PM

## 2022-03-13 LAB — T4: T4, Total: 7.2 ug/dL (ref 4.5–12.0)

## 2022-06-01 ENCOUNTER — Other Ambulatory Visit: Payer: Self-pay | Admitting: Family Medicine

## 2022-06-01 DIAGNOSIS — K219 Gastro-esophageal reflux disease without esophagitis: Secondary | ICD-10-CM

## 2022-08-28 ENCOUNTER — Other Ambulatory Visit: Payer: Self-pay | Admitting: Family Medicine

## 2022-08-28 DIAGNOSIS — E559 Vitamin D deficiency, unspecified: Secondary | ICD-10-CM

## 2022-09-10 ENCOUNTER — Inpatient Hospital Stay: Payer: 59 | Admitting: Hematology and Oncology

## 2022-09-10 ENCOUNTER — Inpatient Hospital Stay: Payer: 59

## 2022-09-10 ENCOUNTER — Other Ambulatory Visit: Payer: Self-pay | Admitting: Hematology and Oncology

## 2022-09-10 ENCOUNTER — Telehealth: Payer: Self-pay | Admitting: Hematology and Oncology

## 2022-09-10 DIAGNOSIS — C090 Malignant neoplasm of tonsillar fossa: Secondary | ICD-10-CM

## 2022-09-10 NOTE — Progress Notes (Signed)
Rescheduled

## 2022-09-10 NOTE — Telephone Encounter (Signed)
Called patient per 4/11 staff message to cancel 4/11 appointment. Left voicemail to confirm appointment has been cancelled and gave contact information to be rescheduled.

## 2022-09-24 ENCOUNTER — Other Ambulatory Visit: Payer: Self-pay | Admitting: Family Medicine

## 2022-09-24 DIAGNOSIS — F410 Panic disorder [episodic paroxysmal anxiety] without agoraphobia: Secondary | ICD-10-CM

## 2022-09-24 DIAGNOSIS — F331 Major depressive disorder, recurrent, moderate: Secondary | ICD-10-CM

## 2022-09-24 DIAGNOSIS — E559 Vitamin D deficiency, unspecified: Secondary | ICD-10-CM

## 2023-02-24 ENCOUNTER — Other Ambulatory Visit: Payer: Self-pay | Admitting: Family Medicine

## 2023-02-24 ENCOUNTER — Ambulatory Visit: Payer: 59 | Admitting: Family Medicine

## 2023-02-24 VITALS — BP 131/82 | HR 96 | Temp 98.8°F | Ht 72.0 in | Wt 294.4 lb

## 2023-02-24 DIAGNOSIS — L570 Actinic keratosis: Secondary | ICD-10-CM

## 2023-02-24 DIAGNOSIS — Z0001 Encounter for general adult medical examination with abnormal findings: Secondary | ICD-10-CM | POA: Diagnosis not present

## 2023-02-24 DIAGNOSIS — R972 Elevated prostate specific antigen [PSA]: Secondary | ICD-10-CM

## 2023-02-24 DIAGNOSIS — Z1211 Encounter for screening for malignant neoplasm of colon: Secondary | ICD-10-CM | POA: Diagnosis not present

## 2023-02-24 DIAGNOSIS — F411 Generalized anxiety disorder: Secondary | ICD-10-CM

## 2023-02-24 DIAGNOSIS — F410 Panic disorder [episodic paroxysmal anxiety] without agoraphobia: Secondary | ICD-10-CM

## 2023-02-24 DIAGNOSIS — Z23 Encounter for immunization: Secondary | ICD-10-CM

## 2023-02-24 DIAGNOSIS — K219 Gastro-esophageal reflux disease without esophagitis: Secondary | ICD-10-CM

## 2023-02-24 DIAGNOSIS — Z0000 Encounter for general adult medical examination without abnormal findings: Secondary | ICD-10-CM

## 2023-02-24 DIAGNOSIS — E559 Vitamin D deficiency, unspecified: Secondary | ICD-10-CM

## 2023-02-24 DIAGNOSIS — R7989 Other specified abnormal findings of blood chemistry: Secondary | ICD-10-CM | POA: Diagnosis not present

## 2023-02-24 DIAGNOSIS — I10 Essential (primary) hypertension: Secondary | ICD-10-CM

## 2023-02-24 DIAGNOSIS — L821 Other seborrheic keratosis: Secondary | ICD-10-CM

## 2023-02-24 LAB — BAYER DCA HB A1C WAIVED: HB A1C (BAYER DCA - WAIVED): 4.7 % — ABNORMAL LOW (ref 4.8–5.6)

## 2023-02-24 MED ORDER — TADALAFIL 5 MG PO TABS: 5.0000 mg | ORAL_TABLET | Freq: Every day | ORAL | 3 refills | Status: DC

## 2023-02-24 MED ORDER — LORAZEPAM 0.5 MG PO TABS
0.5000 mg | ORAL_TABLET | Freq: Every day | ORAL | 3 refills | Status: DC | PRN
Start: 2023-02-24 — End: 2024-02-28

## 2023-02-24 MED ORDER — SERTRALINE HCL 100 MG PO TABS: 100.0000 mg | ORAL_TABLET | Freq: Every day | ORAL | 3 refills | Status: DC

## 2023-02-24 MED ORDER — OMEPRAZOLE 20 MG PO CPDR: 20.0000 mg | DELAYED_RELEASE_CAPSULE | Freq: Two times a day (BID) | ORAL | 3 refills | Status: DC | PRN

## 2023-02-24 NOTE — Progress Notes (Signed)
Eric Weaver is a 52 y.o. male presents to office today for annual physical exam examination.    Concerns today include: 1.  Generalized anxiety disorder Patient reports that he discontinued use of Zoloft.  He utilizes Ativan very sparingly and still has several tablets left over but wants to get a fresh supply to have on hand as it has been over a year since he has had this refilled.  He uses this product less than 1 time per month.  No reports of changes in memory, excessive daytime sedation, falls or respiratory depression.  In fact has been feeling quite a bit better as he has been actively working on lifestyle modification.  Often spending 1 to 2 hours/day in the gym and maintaining a 2000-calorie or less diet.  Working 12-hour shifts now.  2.  Skin lesions Patient reports he has 2 skin lesions on his right forearm that are crusty and come back and even if scratched off.  Has a lesion on the left thigh that has been bothersome.  No spontaneous bleeding or hyperpigmentation.  Not sure that it is getting any larger it is just irritating  Marital status: married, Substance use: none Health Maintenance Due  Topic Date Due   Fecal DNA (Cologuard)  Never done   INFLUENZA VACCINE  12/31/2022   Refills needed today: all  Immunization History  Administered Date(s) Administered   Influenza,inj,Quad PF,6+ Mos 08/10/2019, 04/08/2021   Influenza,inj,quad, With Preservative 03/10/2018   Moderna Sars-Covid-2 Vaccination 09/07/2019, 10/05/2019   Tdap 07/15/2020   Zoster Recombinant(Shingrix) 10/07/2021, 01/07/2022   Past Medical History:  Diagnosis Date   Depression    Essential hypertension 08/02/2018   GERD (gastroesophageal reflux disease)    History of nephrolithiasis    Vitamin D deficiency    Social History   Socioeconomic History   Marital status: Married    Spouse name: Not on file   Number of children: Not on file   Years of education: Not on file   Highest education level:  Associate degree: occupational, Scientist, product/process development, or vocational program  Occupational History   Not on file  Tobacco Use   Smoking status: Never   Smokeless tobacco: Never  Vaping Use   Vaping status: Never Used  Substance and Sexual Activity   Alcohol use: Yes    Comment: occasional   Drug use: No   Sexual activity: Not Currently  Other Topics Concern   Not on file  Social History Narrative   Not on file   Social Determinants of Health   Financial Resource Strain: Low Risk  (02/24/2023)   Overall Financial Resource Strain (CARDIA)    Difficulty of Paying Living Expenses: Not very hard  Food Insecurity: No Food Insecurity (02/24/2023)   Hunger Vital Sign    Worried About Running Out of Food in the Last Year: Never true    Ran Out of Food in the Last Year: Never true  Transportation Needs: No Transportation Needs (02/24/2023)   PRAPARE - Administrator, Civil Service (Medical): No    Lack of Transportation (Non-Medical): No  Physical Activity: Sufficiently Active (02/24/2023)   Exercise Vital Sign    Days of Exercise per Week: 4 days    Minutes of Exercise per Session: 60 min  Stress: Stress Concern Present (02/24/2023)   [employer] of Occupational Health - Occupational Stress Questionnaire    Feeling of Stress : To some extent  Social Connections: Moderately Isolated (02/24/2023)   Social Connection and Isolation Panel [  NHANES]    Frequency of Communication with Friends and Family: Once a week    Frequency of Social Gatherings with Friends and Family: Never    Attends Religious Services: 1 to 4 times per year    Active Member of Clubs or Organizations: No    Attends Engineer, structural: Not on file    Marital Status: Married  Catering manager Violence: Not on file   Past Surgical History:  Procedure Laterality Date   Biopsy of right tonsil Right 04/24/2019   Positive for squamous cell carcinoma   IR GASTROSTOMY TUBE MOD SED  06/06/2019   IR  GASTROSTOMY TUBE REMOVAL  10/06/2019   IR IMAGING GUIDED PORT INSERTION  06/06/2019   IR REMOVAL TUN ACCESS W/ PORT W/O FL MOD SED  11/24/2019   Family History  Problem Relation Age of Onset   Hypertension Mother    Heart attack Father     Current Outpatient Medications:    LORazepam (ATIVAN) 0.5 MG tablet, Take 1-2 tablets (0.5-1 mg total) by mouth daily as needed for anxiety (panic)., Disp: 60 tablet, Rfl: 3   omeprazole (PRILOSEC) 20 MG capsule, Take 1 capsule (20 mg total) by mouth 2 (two) times daily as needed., Disp: 180 capsule, Rfl: 3   tadalafil (CIALIS) 5 MG tablet, Take 1 tablet (5 mg total) by mouth daily., Disp: 90 tablet, Rfl: 3  No Known Allergies   ROS: Review of Systems Pertinent items noted in HPI and remainder of comprehensive ROS otherwise negative.    Physical exam Ht 6' (1.829 m)   BMI 40.17 kg/m  General appearance: alert, cooperative, appears stated age, no distress, and mildly obese Head: Normocephalic, without obvious abnormality, atraumatic Eyes: negative findings: lids and lashes normal, conjunctivae and sclerae normal, corneas clear, and pupils equal, round, reactive to light and accomodation Ears: normal TM's and external ear canals both ears Nose: Nares normal. Septum midline. Mucosa normal. No drainage or sinus tenderness. Throat: lips, mucosa, and tongue normal; teeth and gums normal Neck: no adenopathy, no carotid bruit, supple, symmetrical, trachea midline, and thyroid not enlarged, symmetric, no tenderness/mass/nodules Back: symmetric, no curvature. ROM normal. No CVA tenderness. Lungs: clear to auscultation bilaterally Chest wall: no tenderness Heart: regular rate and rhythm, S1, S2 normal, no murmur, click, rub or gallop Abdomen: soft, non-tender; bowel sounds normal; no masses,  no organomegaly Extremities: extremities normal, atraumatic, no cyanosis or edema Pulses: 2+ and symmetric Skin:  2 small actinic keratoses noted on the dorsal aspect  of the right forearm distally.  He has a seborrheic keratoses that is approximately 3 mm x 4 mm in size and is flesh-colored on the left anterior lateral thigh just above the knee Lymph nodes: Cervical, supraclavicular, and axillary nodes normal. Neurologic: Grossly normal      02/24/2023    3:40 PM 01/07/2022    3:59 PM 10/07/2021    9:16 AM  Depression screen PHQ 2/9  Decreased Interest 1 0 1  Down, Depressed, Hopeless 1 0 1  PHQ - 2 Score 2 0 2  Altered sleeping 1 1 2   Tired, decreased energy 1 2 2   Change in appetite 0 1 0  Feeling bad or failure about yourself  0 0 0  Trouble concentrating 0 0 1  Moving slowly or fidgety/restless 0 0 0  Suicidal thoughts 0 0 0  PHQ-9 Score 4 4 7   Difficult doing work/chores Somewhat difficult Not difficult at all Somewhat difficult      02/24/2023  3:40 PM 01/07/2022    4:00 PM 10/07/2021    9:16 AM 04/08/2021    4:05 PM  GAD 7 : Generalized Anxiety Score  Nervous, Anxious, on Edge 1 1 2 3   Control/stop worrying 1 0 2 0  Worry too much - different things 0 0 2 0  Trouble relaxing 1 1 2 1   Restless 0 0 1 0  Easily annoyed or irritable 1 1 2 3   Afraid - awful might happen 0 0 0 0  Total GAD 7 Score 4 3 11 7   Anxiety Difficulty Not difficult at all Not difficult at all Somewhat difficult Somewhat difficult   Cryotherapy Procedure:  Risks and benefits of procedure were reviewed with the patient.  Written consent obtained and scanned into the chart.  Lesion of concern was identified and located on right forearm x2, left thigh x1.  Liquid nitrogen was applied to area of concern and extending out 1 millimeters beyond the border of the lesion.  Treated area was allowed to come back to room temperature before treating it a second time.  Patient tolerated procedure well and there were no immediate complications.  Home care instructions were reviewed with the patient and a handout was provided.   Assessment/ Plan: Erskin Burnet here for annual  physical exam.   Annual physical exam  Colon cancer screening - Plan: Cologuard  Morbid obesity (HCC) - Plan: Bayer DCA Hb A1c Waived  Elevated PSA, less than 10 ng/ml - Plan: PSA, CBC  Elevated liver function tests - Plan: CMP14+EGFR, Lipid Panel, TSH  Essential hypertension - Plan: CMP14+EGFR  Vitamin D deficiency - Plan: VITAMIN D 25 Hydroxy (Vit-D Deficiency, Fractures)  Low testosterone in male - Plan: Testosterone  Actinic keratoses  Seborrheic keratoses  Gastroesophageal reflux disease without esophagitis - Plan: omeprazole (PRILOSEC) 20 MG capsule  Generalized anxiety disorder with panic attacks - Plan: LORazepam (ATIVAN) 0.5 MG tablet, ToxASSURE [place] 13 (MW), Urine, DISCONTINUED: sertraline (ZOLOFT) 100 MG tablet  Cologuard ordered for colon cancer screening.  No apparent contraindications to use.  Influenza vaccination administered.  A1c collected today given morbid obesity.  He is actively changing lifestyle with vigorous exercise and diet modification.  Check PSA, CBC, lipid panel, CMP and TSH.  Blood pressure has been diet controlled.  No changes needed.  He would like to have testosterone rechecked in a future order has been placed.  Advised to have appointment prior to 9 AM  Identified both actinic keratoses and seborrheic keratoses on exam today.  His actinic keratoses were treated with cryoablation.  See above procedure note  GERD is chronic and stable.  PPI renewed  No longer taking Zoloft and having extremely sparing use of Ativan use, less than 1 time per month.  UDS and CSA were updated as per office policy and the national narcotic database was reviewed.  Lorazepam sent to pharmacy.  Counseled on healthy lifestyle choices, including diet (rich in fruits, vegetables and lean meats and low in salt and simple carbohydrates) and exercise (at least 30 minutes of moderate physical activity daily).  Patient to follow up 1 year  Chayse Zatarain M. Nadine Counts,  DO

## 2023-02-24 NOTE — Patient Instructions (Signed)
Cryoablation, Care After The following information offers guidance on how to care for yourself after your procedure. Your health care provider may also give you more specific instructions. If you have problems or questions, contact your health care provider. What can I expect after the procedure? After the procedure, it is common to have: Redness or blisters near the area treated. Mild pain and swelling. Follow these instructions at home: Treatment area care  If you have an incision, follow instructions from your health care provider about how to take care of it. Make sure you: Wash your hands with soap and water for at least 20 seconds before and after you change your bandage (dressing). If soap and water are not available, use hand sanitizer. Change your dressing as told by your health care provider. Leave stitches (sutures), skin glue, or adhesive strips in place. These skin closures may need to stay in place for 2 weeks or longer. If adhesive strip edges start to loosen and curl up, you may trim the loose edges. Do not remove adhesive strips completely unless your health care provider tells you to do that. Check your treatment area every day for signs of infection. Check for: More redness, swelling, or pain. Fluid or blood. Warmth. Pus or a bad smell. Keep the treated area clean and dry. Keep it covered with a dressing until it has healed. Clean the area with soap and water as told by your health care provider. If your dressing gets wet, change it right away. Activity  Follow instructions from your health care provider about what activities are safe for you. You may have to avoid lifting. Ask your health care provider how much you can safely lift. If you were given a sedative during the procedure, it can affect you for several hours. Do not drive or operate machinery until your health care provider says that it is safe. General instructions Take over-the-counter and prescription  medicines only as told by your health care provider. Do not use any products that contain nicotine or tobacco. These products include cigarettes, chewing tobacco, and vaping devices, such as e-cigarettes. These can delay incision healing. If you need help quitting, ask your health care provider. Do not take baths, swim, or use a hot tub until your health care provider approves. Ask your health care provider if you may take showers. You may only be allowed to take sponge baths. Keep all follow-up visits. Your health care provider may need to check that treatment worked and that there were no problems caused by the procedure. Contact a health care provider if: You have more pain. You have a fever. You have nausea or vomiting. You have any signs of infection. You do not have a bowel movement for 2 days. You cannot urinate, or you cannot control when you urinate or have a bowel movement (have incontinence). You develop impotence. Get help right away if: You have severe pain. You have trouble swallowing or breathing. You are very weak or dizzy. You have chest pain or shortness of breath. These symptoms may be an emergency. Get help right away. Call 911. Do not wait to see if the symptoms will go away. Do not drive yourself to the hospital. This information is not intended to replace advice given to you by your health care provider. Make sure you discuss any questions you have with your health care provider. Document Revised: 10/31/2021 Document Reviewed: 10/31/2021 Elsevier Patient Education  2024 [employer].

## 2023-02-25 ENCOUNTER — Encounter: Payer: Self-pay | Admitting: Family Medicine

## 2023-02-25 LAB — CMP14+EGFR
ALT: 48 IU/L — ABNORMAL HIGH (ref 0–44)
AST: 40 IU/L (ref 0–40)
Albumin: 4.8 g/dL (ref 3.8–4.9)
Alkaline Phosphatase: 56 IU/L (ref 44–121)
BUN/Creatinine Ratio: 22 — ABNORMAL HIGH (ref 9–20)
BUN: 22 mg/dL (ref 6–24)
Bilirubin Total: 0.4 mg/dL (ref 0.0–1.2)
CO2: 22 mmol/L (ref 20–29)
Calcium: 9.6 mg/dL (ref 8.7–10.2)
Chloride: 104 mmol/L (ref 96–106)
Creatinine, Ser: 1.02 mg/dL (ref 0.76–1.27)
Globulin, Total: 2.7 g/dL (ref 1.5–4.5)
Glucose: 114 mg/dL — ABNORMAL HIGH (ref 70–99)
Potassium: 4.6 mmol/L (ref 3.5–5.2)
Sodium: 142 mmol/L (ref 134–144)
Total Protein: 7.5 g/dL (ref 6.0–8.5)
eGFR: 88 mL/min/{1.73_m2} (ref >59)

## 2023-02-25 LAB — LIPID PANEL
Chol/HDL Ratio: 4.6 ratio (ref 0.0–5.0)
Cholesterol, Total: 192 mg/dL (ref 100–199)
HDL: 42 mg/dL (ref >39)
LDL Chol Calc (NIH): 121 mg/dL — ABNORMAL HIGH (ref 0–99)
Triglycerides: 166 mg/dL — ABNORMAL HIGH (ref 0–149)
VLDL Cholesterol Cal: 29 mg/dL (ref 5–40)

## 2023-02-25 LAB — CBC
Hematocrit: 45.4 % (ref 37.5–51.0)
Hemoglobin: 15.1 g/dL (ref 13.0–17.7)
MCH: 30.9 pg (ref 26.6–33.0)
MCHC: 33.3 g/dL (ref 31.5–35.7)
MCV: 93 fL (ref 79–97)
Platelets: 247 10*3/uL (ref 150–450)
RBC: 4.89 x10E6/uL (ref 4.14–5.80)
RDW: 13 % (ref 11.6–15.4)
WBC: 6.5 10*3/uL (ref 3.4–10.8)

## 2023-02-25 LAB — VITAMIN D 25 HYDROXY (VIT D DEFICIENCY, FRACTURES): Vit D, 25-Hydroxy: 50.1 ng/mL (ref 30.0–100.0)

## 2023-02-25 LAB — TSH: TSH: 2.95 u[IU]/mL (ref 0.450–4.500)

## 2023-02-25 LAB — PSA: Prostate Specific Ag, Serum: 1.4 ng/mL (ref 0.0–4.0)

## 2023-03-01 DIAGNOSIS — Z23 Encounter for immunization: Secondary | ICD-10-CM | POA: Diagnosis not present

## 2023-03-02 LAB — TOXASSURE SELECT 13 (MW), URINE

## 2023-03-09 ENCOUNTER — Other Ambulatory Visit: Payer: Self-pay

## 2023-03-09 ENCOUNTER — Other Ambulatory Visit: Payer: 59

## 2023-03-09 DIAGNOSIS — R7989 Other specified abnormal findings of blood chemistry: Secondary | ICD-10-CM

## 2023-03-10 LAB — TESTOSTERONE: Testosterone: 294 ng/dL (ref 264–916)

## 2023-03-13 LAB — COLOGUARD: COLOGUARD: NEGATIVE

## 2023-03-29 ENCOUNTER — Other Ambulatory Visit: Payer: Self-pay | Admitting: *Deleted

## 2023-03-29 DIAGNOSIS — F410 Panic disorder [episodic paroxysmal anxiety] without agoraphobia: Secondary | ICD-10-CM

## 2023-03-29 DIAGNOSIS — K219 Gastro-esophageal reflux disease without esophagitis: Secondary | ICD-10-CM

## 2023-03-29 MED ORDER — TADALAFIL 5 MG PO TABS: 5.0000 mg | ORAL_TABLET | Freq: Every day | ORAL | 2 refills | Status: DC

## 2023-03-29 MED ORDER — OMEPRAZOLE 20 MG PO CPDR: 20.0000 mg | DELAYED_RELEASE_CAPSULE | Freq: Two times a day (BID) | ORAL | 2 refills | Status: DC | PRN

## 2023-04-14 ENCOUNTER — Other Ambulatory Visit: Payer: Self-pay | Admitting: *Deleted

## 2023-04-14 DIAGNOSIS — F410 Panic disorder [episodic paroxysmal anxiety] without agoraphobia: Secondary | ICD-10-CM

## 2023-04-16 ENCOUNTER — Other Ambulatory Visit: Payer: Self-pay

## 2023-04-16 DIAGNOSIS — F410 Panic disorder [episodic paroxysmal anxiety] without agoraphobia: Secondary | ICD-10-CM

## 2023-09-09 ENCOUNTER — Other Ambulatory Visit: Payer: Self-pay | Admitting: Family Medicine

## 2023-09-09 DIAGNOSIS — F410 Panic disorder [episodic paroxysmal anxiety] without agoraphobia: Secondary | ICD-10-CM

## 2023-11-15 ENCOUNTER — Other Ambulatory Visit: Payer: Self-pay | Admitting: Family Medicine

## 2023-11-15 DIAGNOSIS — K219 Gastro-esophageal reflux disease without esophagitis: Secondary | ICD-10-CM

## 2024-02-28 ENCOUNTER — Encounter: Payer: Self-pay | Admitting: Family Medicine

## 2024-02-28 ENCOUNTER — Ambulatory Visit: Payer: 59 | Admitting: Family Medicine

## 2024-02-28 VITALS — BP 136/83 | HR 58 | Temp 98.0°F | Ht 72.0 in | Wt 276.5 lb

## 2024-02-28 DIAGNOSIS — F410 Panic disorder [episodic paroxysmal anxiety] without agoraphobia: Secondary | ICD-10-CM

## 2024-02-28 DIAGNOSIS — Z789 Other specified health status: Secondary | ICD-10-CM

## 2024-02-28 DIAGNOSIS — Z23 Encounter for immunization: Secondary | ICD-10-CM | POA: Diagnosis not present

## 2024-02-28 DIAGNOSIS — F411 Generalized anxiety disorder: Secondary | ICD-10-CM | POA: Diagnosis not present

## 2024-02-28 DIAGNOSIS — L570 Actinic keratosis: Secondary | ICD-10-CM

## 2024-02-28 DIAGNOSIS — K219 Gastro-esophageal reflux disease without esophagitis: Secondary | ICD-10-CM | POA: Diagnosis not present

## 2024-02-28 DIAGNOSIS — E559 Vitamin D deficiency, unspecified: Secondary | ICD-10-CM

## 2024-02-28 DIAGNOSIS — Z87898 Personal history of other specified conditions: Secondary | ICD-10-CM

## 2024-02-28 DIAGNOSIS — Z0000 Encounter for general adult medical examination without abnormal findings: Secondary | ICD-10-CM

## 2024-02-28 DIAGNOSIS — Z0001 Encounter for general adult medical examination with abnormal findings: Secondary | ICD-10-CM | POA: Diagnosis not present

## 2024-02-28 DIAGNOSIS — I10 Essential (primary) hypertension: Secondary | ICD-10-CM

## 2024-02-28 LAB — LIPID PANEL

## 2024-02-28 MED ORDER — TADALAFIL 5 MG PO TABS: 5.0000 mg | ORAL_TABLET | Freq: Every day | ORAL | 3 refills | Status: AC

## 2024-02-28 MED ORDER — OMEPRAZOLE 20 MG PO CPDR: 20.0000 mg | DELAYED_RELEASE_CAPSULE | Freq: Two times a day (BID) | ORAL | 3 refills | Status: AC

## 2024-02-28 MED ORDER — LORAZEPAM 0.5 MG PO TABS: 0.5000 mg | ORAL_TABLET | Freq: Every day | ORAL | 3 refills | Status: AC | PRN

## 2024-02-28 NOTE — Progress Notes (Signed)
 Eric Weaver is a 53 y.o. male presents to office today for annual physical exam examination.    Skin lesion Reports a skin lesion on the right upper chest that been present for about 6 months.  It seems to be scaling now.  Continues to have sparing use of the Ativan .  Things seem to be getting better now that his 53 year old has moved out on his own.  His other 3 children are doing well.  He and his wife actually recently took a solo 1 day trip just by themselves and that was pretty enjoyable  Occupation: works on concrete floors, feet sometimes hurt. Marital status: married, Substance use: none Health Maintenance Due  Topic Date Due   HIV Screening  Never done   Hepatitis B Vaccines 19-59 Average Risk (1 of 3 - 19+ 3-dose series) Never done   Pneumococcal Vaccine: 50+ Years (1 of 1 - PCV) Never done   Influenza Vaccine  12/31/2023    Immunization History  Administered Date(s) Administered   Influenza, Seasonal, Injecte, Preservative Fre 03/01/2023   Influenza,inj,Quad PF,6+ Mos 08/10/2019, 04/08/2021   Influenza,inj,quad, With Preservative 03/10/2018   Moderna Sars-Covid-2 Vaccination 09/07/2019, 10/05/2019   Tdap 07/15/2020   Zoster Recombinant(Shingrix ) 10/07/2021, 01/07/2022   Past Medical History:  Diagnosis Date   Depression    Essential hypertension 08/02/2018   GERD (gastroesophageal reflux disease)    History of nephrolithiasis    Vitamin D  deficiency    Social History   Socioeconomic History   Marital status: Married    Spouse name: Not on file   Number of children: Not on file   Years of education: Not on file   Highest education level: Associate degree: occupational, Scientist, product/process development, or vocational program  Occupational History   Not on file  Tobacco Use   Smoking status: Never   Smokeless tobacco: Never  Vaping Use   Vaping status: Never Used  Substance and Sexual Activity   Alcohol use: Yes    Comment: occasional   Drug use: No   Sexual activity: Not  Currently  Other Topics Concern   Not on file  Social History Narrative   Not on file   Social Drivers of Health   Financial Resource Strain: Low Risk  (02/24/2023)   Overall Financial Resource Strain (CARDIA)    Difficulty of Paying Living Expenses: Not very hard  Food Insecurity: No Food Insecurity (02/24/2023)   Hunger Vital Sign    Worried About Running Out of Food in the Last Year: Never true    Ran Out of Food in the Last Year: Never true  Transportation Needs: No Transportation Needs (02/24/2023)   PRAPARE - Administrator, Civil Service (Medical): No    Lack of Transportation (Non-Medical): No  Physical Activity: Sufficiently Active (02/24/2023)   Exercise Vital Sign    Days of Exercise per Week: 4 days    Minutes of Exercise per Session: 60 min  Stress: Stress Concern Present (02/24/2023)   [employer] of Occupational Health - Occupational Stress Questionnaire    Feeling of Stress : To some extent  Social Connections: Moderately Isolated (02/24/2023)   Social Connection and Isolation Panel    Frequency of Communication with Friends and Family: Once a week    Frequency of Social Gatherings with Friends and Family: Never    Attends Religious Services: 1 to 4 times per year    Active Member of [employer] or Organizations: No    Attends Banker Meetings: Not  on file    Marital Status: Married  Catering manager Violence: Not on file   Past Surgical History:  Procedure Laterality Date   Biopsy of right tonsil Right 04/24/2019   Positive for squamous cell carcinoma   IR GASTROSTOMY TUBE MOD SED  06/06/2019   IR GASTROSTOMY TUBE REMOVAL  10/06/2019   IR IMAGING GUIDED PORT INSERTION  06/06/2019   IR REMOVAL TUN ACCESS W/ PORT W/O FL MOD SED  11/24/2019   Family History  Problem Relation Age of Onset   Hypertension Mother    Heart attack Father     Current Outpatient Medications:    LORazepam  (ATIVAN ) 0.5 MG tablet, Take 1-2 tablets (0.5-1 mg total) by  mouth daily as needed for anxiety (panic)., Disp: 60 tablet, Rfl: 3   omeprazole  (PRILOSEC) 20 MG capsule, TAKE 1 CAPSULE BY MOUTH TWICE  DAILY AS NEEDED, Disp: 180 capsule, Rfl: 3   tadalafil  (CIALIS ) 5 MG tablet, TAKE 1 TABLET BY MOUTH DAILY, Disp: 90 tablet, Rfl: 3  No Known Allergies   ROS: Review of Systems Pertinent items noted in HPI and remainder of comprehensive ROS otherwise negative.    Physical exam BP 136/83   Pulse (!) 58   Temp 98 F (36.7 C)   Ht 6' (1.829 m)   Wt 276 lb 8 oz (125.4 kg)   SpO2 98%   BMI 37.50 kg/m  General appearance: alert, cooperative, appears stated age, no distress, and morbidly obese Head: Normocephalic, without obvious abnormality, atraumatic Eyes: negative findings: lids and lashes normal, conjunctivae and sclerae normal, corneas clear, and pupils equal, round, reactive to light and accomodation Ears: normal TM's and external ear canals both ears Nose: Nares normal. Septum midline. Mucosa normal. No drainage or sinus tenderness. Throat: lips, mucosa, and tongue normal; teeth and gums normal Neck: no adenopathy, no carotid bruit, supple, symmetrical, trachea midline, and thyroid  not enlarged, symmetric, no tenderness/mass/nodules Back: symmetric, no curvature. ROM normal. No CVA tenderness. Lungs: clear to auscultation bilaterally Heart: regular rate and rhythm, S1, S2 normal, no murmur, click, rub or gallop Abdomen: soft, non-tender; bowel sounds normal; no masses,  no organomegaly Extremities: extremities normal, atraumatic, no cyanosis or edema Pulses: 2+ and symmetric Skin: Has several pigmented skin nevi noted on the back and chest.  He has a hyperkeratotic lesion along the right anterior chest that is flesh-colored with some scaling.  Several seborrheic keratoses noted on the back Lymph nodes: Cervical, supraclavicular, and axillary nodes normal. Neurologic: Grossly normal      02/28/2024    3:43 PM 02/24/2023    3:40 PM 01/07/2022     3:59 PM  Depression screen PHQ 2/9  Decreased Interest 1 1 0  Down, Depressed, Hopeless 0 1 0  PHQ - 2 Score 1 2 0  Altered sleeping 1 1 1   Tired, decreased energy 1 1 2   Change in appetite 0 0 1  Feeling bad or failure about yourself  0 0 0  Trouble concentrating 0 0 0  Moving slowly or fidgety/restless 0 0 0  Suicidal thoughts 0 0 0  PHQ-9 Score 3 4 4   Difficult doing work/chores Not difficult at all Somewhat difficult Not difficult at all      02/28/2024    3:43 PM 02/24/2023    3:40 PM 01/07/2022    4:00 PM 10/07/2021    9:16 AM  GAD 7 : Generalized Anxiety Score  Nervous, Anxious, on Edge 1 1 1 2   Control/stop worrying 0 1 0 2  Worry too much - different things 1 0 0 2  Trouble relaxing 1 1 1 2   Restless 0 0 0 1  Easily annoyed or irritable 1 1 1 2   Afraid - awful might happen 0 0 0 0  Total GAD 7 Score 4 4 3 11   Anxiety Difficulty Not difficult at all Not difficult at all Not difficult at all Somewhat difficult    No results found for this or any previous visit (from the past 2160 hours).  Cryotherapy Procedure:  Risks and benefits of procedure were reviewed with the patient.  Written consent obtained and scanned into the chart.  Lesion of concern was identified and located on right anterior chest.  Liquid nitrogen was applied to area of concern and extending out 1 millimeters beyond the border of the lesion.  Treated area was allowed to come back to room temperature before treating it a second time.  Patient tolerated procedure well and there were no immediate complications.  Home care instructions were reviewed with the patient and a handout was provided.   Assessment/ Plan: Ozell Buerger here for annual physical exam.   Annual physical exam  Actinic keratosis  Gastroesophageal reflux disease without esophagitis - Plan: CBC, omeprazole  (PRILOSEC) 20 MG capsule  Generalized anxiety disorder with panic attacks - Plan: ToxASSURE Select 13 (MW), Urine, LORazepam   (ATIVAN ) 0.5 MG tablet  Vitamin D  deficiency - Plan: VITAMIN D  25 Hydroxy (Vit-D Deficiency, Fractures)  Morbid obesity (HCC) - Plan: CMP14+EGFR, Lipid panel, TSH  Essential hypertension - Plan: CMP14+EGFR  History of elevated PSA - Plan: PSA  Hepatitis B vaccination status unknown - Plan: Hepatitis B surface antibody,quantitative  Encounter for immunization - Plan: Flu vaccine trivalent PF, 6mos and older(Flulaval,Afluria,Fluarix,Fluzone)   AK on chest treated with cryo. No immediate complications. Home care instructions reviewed and provided on AVS.  Seems to be doing well.  We updated his flu shot today.  Meds have been renewed.  UDS and CSA are up-to-date as per office policy for anxiety disorder and as needed Ativan .  No red flag signs or symptoms.  The national narcotic database was reviewed.  Nonfasting labs collected today.  Continue adequate lifestyle modification to reduce BMI and reduce cardiovascular health.  No symptoms from a prostate standpoint but has been less than 24 hours since last intercourse  Counseled on healthy lifestyle choices, including diet (rich in fruits, vegetables and lean meats and low in salt and simple carbohydrates) and exercise (at least 30 minutes of moderate physical activity daily).  Patient to follow up 1 year for CPE  Rahsaan Weakland M. Jolinda, DO

## 2024-02-28 NOTE — Patient Instructions (Signed)
 Cryoablation, Care After The following information offers guidance on how to care for yourself after your procedure. Your health care provider may also give you more specific instructions. If you have problems or questions, contact your health care provider. What can I expect after the procedure? After the procedure, it is common to have: Redness or blisters near the area treated. Mild pain and swelling. Follow these instructions at home: Treatment area care  If you have an incision, follow instructions from your health care provider about how to take care of it. Make sure you: Wash your hands with soap and water  for at least 20 seconds before and after you change your bandage (dressing). If soap and water  are not available, use hand sanitizer. Change your dressing as told by your health care provider. Leave stitches (sutures), skin glue, or adhesive strips in place. These skin closures may need to stay in place for 2 weeks or longer. If adhesive strip edges start to loosen and curl up, you may trim the loose edges. Do not remove adhesive strips completely unless your health care provider tells you to do that. Check your treatment area every day for signs of infection. Check for: More redness, swelling, or pain. Fluid or blood. Warmth. Pus or a bad smell. Keep the treated area clean and dry. Keep it covered with a dressing until it has healed. Clean the area with soap and water  as told by your health care provider. If your dressing gets wet, change it right away. Activity  Follow instructions from your health care provider about what activities are safe for you. You may have to avoid lifting. Ask your health care provider how much you can safely lift. If you were given a sedative during the procedure, it can affect you for several hours. Do not drive or operate machinery until your health care provider says that it is safe. General instructions Take over-the-counter and prescription  medicines only as told by your health care provider. Do not use any products that contain nicotine or tobacco. These products include cigarettes, chewing tobacco, and vaping devices, such as e-cigarettes. These can delay incision healing. If you need help quitting, ask your health care provider. Do not take baths, swim, or use a hot tub until your health care provider approves. Ask your health care provider if you may take showers. You may only be allowed to take sponge baths. Keep all follow-up visits. Your health care provider may need to check that treatment worked and that there were no problems caused by the procedure. Contact a health care provider if: You have more pain. You have a fever. You have nausea or vomiting. You have any signs of infection. You do not have a bowel movement for 2 days. You cannot urinate, or you cannot control when you urinate or have a bowel movement (have incontinence). You develop impotence. Get help right away if: You have severe pain. You have trouble swallowing or breathing. You are very weak or dizzy. You have chest pain or shortness of breath. These symptoms may be an emergency. Get help right away. Call 911. Do not wait to see if the symptoms will go away. Do not drive yourself to the hospital. This information is not intended to replace advice given to you by your health care provider. Make sure you discuss any questions you have with your health care provider. Document Revised: 10/31/2021 Document Reviewed: 10/31/2021 Elsevier Patient Education  2024 [employer].

## 2024-02-29 ENCOUNTER — Ambulatory Visit: Payer: Self-pay | Admitting: Family Medicine

## 2024-02-29 LAB — CBC
Hematocrit: 44.6 % (ref 37.5–51.0)
Hemoglobin: 15.1 g/dL (ref 13.0–17.7)
MCH: 30.9 pg (ref 26.6–33.0)
MCHC: 33.9 g/dL (ref 31.5–35.7)
MCV: 91 fL (ref 79–97)
Platelets: 236 x10E3/uL (ref 150–450)
RBC: 4.89 x10E6/uL (ref 4.14–5.80)
RDW: 13.2 % (ref 11.6–15.4)
WBC: 7.5 x10E3/uL (ref 3.4–10.8)

## 2024-02-29 LAB — CMP14+EGFR
ALT: 36 IU/L (ref 0–44)
AST: 26 IU/L (ref 0–40)
Albumin: 4.6 g/dL (ref 3.8–4.9)
Alkaline Phosphatase: 58 IU/L (ref 47–123)
BUN/Creatinine Ratio: 15 (ref 9–20)
BUN: 15 mg/dL (ref 6–24)
Bilirubin Total: 0.4 mg/dL (ref 0.0–1.2)
CO2: 22 mmol/L (ref 20–29)
Calcium: 9.7 mg/dL (ref 8.7–10.2)
Chloride: 102 mmol/L (ref 96–106)
Creatinine, Ser: 0.98 mg/dL (ref 0.76–1.27)
Globulin, Total: 2.3 g/dL (ref 1.5–4.5)
Glucose: 95 mg/dL (ref 70–99)
Potassium: 4.6 mmol/L (ref 3.5–5.2)
Sodium: 140 mmol/L (ref 134–144)
Total Protein: 6.9 g/dL (ref 6.0–8.5)
eGFR: 92 mL/min/1.73 (ref >59)

## 2024-02-29 LAB — LIPID PANEL
Cholesterol, Total: 169 mg/dL (ref 100–199)
HDL: 40 mg/dL (ref >39)
LDL CALC COMMENT:: 4.2 ratio (ref 0.0–5.0)
LDL Chol Calc (NIH): 104 mg/dL — AB (ref 0–99)
Triglycerides: 138 mg/dL (ref 0–149)
VLDL Cholesterol Cal: 25 mg/dL (ref 5–40)

## 2024-02-29 LAB — VITAMIN D 25 HYDROXY (VIT D DEFICIENCY, FRACTURES): Vit D, 25-Hydroxy: 40.9 ng/mL (ref 30.0–100.0)

## 2024-02-29 LAB — TSH: TSH: 3.25 u[IU]/mL (ref 0.450–4.500)

## 2024-02-29 LAB — PSA: Prostate Specific Ag, Serum: 1.6 ng/mL (ref 0.0–4.0)

## 2024-02-29 LAB — HEPATITIS B SURFACE ANTIBODY, QUANTITATIVE: Hepatitis B Surf Ab Quant: <3.5 m[IU]/mL — AB

## 2024-03-02 LAB — TOXASSURE SELECT 13 (MW), URINE

## 2024-03-26 ENCOUNTER — Emergency Department (HOSPITAL_COMMUNITY)

## 2024-03-26 ENCOUNTER — Other Ambulatory Visit: Payer: Self-pay

## 2024-03-26 ENCOUNTER — Emergency Department (HOSPITAL_COMMUNITY)
Admission: EM | Admit: 2024-03-26 | Discharge: 2024-03-26 | Disposition: A | Discharge status: Home / Self Care | Attending: Emergency Medicine | Admitting: Emergency Medicine

## 2024-03-26 ENCOUNTER — Encounter (HOSPITAL_COMMUNITY): Payer: Self-pay | Admitting: Emergency Medicine

## 2024-03-26 DIAGNOSIS — D6869 Other thrombophilia: Secondary | ICD-10-CM | POA: Insufficient documentation

## 2024-03-26 DIAGNOSIS — I10 Essential (primary) hypertension: Secondary | ICD-10-CM | POA: Insufficient documentation

## 2024-03-26 DIAGNOSIS — I480 Paroxysmal atrial fibrillation: Secondary | ICD-10-CM | POA: Diagnosis not present

## 2024-03-26 DIAGNOSIS — Z79899 Other long term (current) drug therapy: Secondary | ICD-10-CM | POA: Insufficient documentation

## 2024-03-26 DIAGNOSIS — R002 Palpitations: Secondary | ICD-10-CM | POA: Diagnosis present

## 2024-03-26 LAB — CBC
HCT: 46.4 % (ref 39.0–52.0)
Hemoglobin: 15.7 g/dL (ref 13.0–17.0)
MCH: 29.9 pg (ref 26.0–34.0)
MCHC: 33.8 g/dL (ref 30.0–36.0)
MCV: 88.4 fL (ref 80.0–100.0)
Platelets: 294 K/uL (ref 150–400)
RBC: 5.25 MIL/uL (ref 4.22–5.81)
RDW: 13 % (ref 11.5–15.5)
WBC: 9.6 K/uL (ref 4.0–10.5)
nRBC: 0 % (ref 0.0–0.2)

## 2024-03-26 LAB — BASIC METABOLIC PANEL WITH GFR
Anion gap: 7 (ref 5–15)
BUN: 16 mg/dL (ref 6–20)
CO2: 23 mmol/L (ref 22–32)
Calcium: 9.3 mg/dL (ref 8.9–10.3)
Chloride: 107 mmol/L (ref 98–111)
Creatinine, Ser: 1.01 mg/dL (ref 0.61–1.24)
GFR, Estimated: >60 mL/min (ref >60)
Glucose, Bld: 117 mg/dL — ABNORMAL HIGH (ref 70–99)
Potassium: 4.2 mmol/L (ref 3.5–5.1)
Sodium: 137 mmol/L (ref 135–145)

## 2024-03-26 LAB — TROPONIN I (HIGH SENSITIVITY): Troponin I (High Sensitivity): 9 ng/L (ref <18)

## 2024-03-26 MED ORDER — METOPROLOL TARTRATE 25 MG PO TABS
25.0000 mg | ORAL_TABLET | Freq: Three times a day (TID) | ORAL | 0 refills | Status: AC | PRN
Start: 2024-03-26 — End: 2024-04-25

## 2024-03-26 NOTE — Discharge Instructions (Addendum)
 You were seen today for palpitations. While you were here we monitored your vitals, preformed a physical exam, and you were seen by cardiology. These were all reassuring and there is no indication for any further testing or intervention in the emergency department at this time.   Things to do:  - Follow up with cardiology in the next 1 to 2 weeks for outpatient testing.  You will be receiving a phone call to schedule -Take metoprolol 25 mg up to 3 times a day as needed for heart rate over 100 bpm  Return to the emergency department if you have any new or worsening symptoms including passing out, shortness of breath, chest pain, or if you have any other concerns.

## 2024-03-26 NOTE — ED Provider Triage Note (Signed)
 Emergency Medicine Provider Triage Evaluation Note  Eric Weaver , a 53 y.o. male  was evaluated in triage.  Pt complains of chest discomfort/A-fib.  Patient was admitted at a hospital in [facility] yesterday, he notes that when he was working out yesterday he began to feel a fluttering sensation in his chest, this prompted him to proceed to the emergency department where he was told that he was in A-fib and was having an NSTEMI, with troponins elevated at 40 then 32, he was admitted and initiated on a heparin  drip but was told by nurses that he should leave and go to another hospital because we do not have a cardiologist, he then signed out AMA this morning at their suggestion and has come here for further evaluation.  He reports that chest discomfort/fluttering sensation has largely resolved, he denies shortness of breath at this time.  Review of Systems  Positive: As above Negative: As above  Physical Exam  BP 117/69 (BP Location: Left Arm)   Pulse 69   Temp 97.9 F (36.6 C)   Resp 20   SpO2 99%  Gen:   Awake, no distress   Resp:  Normal effort  MSK:   Moves extremities without difficulty  Other:    Medical Decision Making  Medically screening exam initiated at 12:35 PM.  Appropriate orders placed.  Kempton Milne was informed that the remainder of the evaluation will be completed by another provider, this initial triage assessment does not replace that evaluation, and the importance of remaining in the ED until their evaluation is complete.     Glendia Rocky SAILOR, [place] 03/26/24 1237

## 2024-03-26 NOTE — ED Notes (Signed)
 Patient denies chest pain, states it feels like fluttering.

## 2024-03-26 NOTE — ED Provider Notes (Signed)
 [facility] AT [facility] Provider Note   CSN: [id number] Arrival date & time: 03/26/24  1204     Patient presents with: Chest Pain   Eric Weaver is a 53 y.o. male with history of hypertension presents with complaints of palpitations.  Patient states that he started having these yesterday when working out.  He went to a hospital in [place] he was found to be in A-fib with initial troponin of 40 that subsequently downtrended to 32.  He was given heparin .  He was admitted to the hospital with plans for an echo.  It sounds that he was recommended to be transferred, however he end up leaving AMA and coming here.  Patient is currently asymptomatic without any chest pain, shortness of breath, palpitations.  He has never had this before.  He does report that the night prior to onset of the symptoms he had had a few glasses of bourbon and additionally felt that he had been dehydrated.    Chest Pain  Past Medical History:  Diagnosis Date   Depression    Essential hypertension 08/02/2018   GERD (gastroesophageal reflux disease)    History of nephrolithiasis    Vitamin D  deficiency    Past Surgical History:  Procedure Laterality Date   Biopsy of right tonsil Right 04/24/2019   Positive for squamous cell carcinoma   IR GASTROSTOMY TUBE MOD SED  06/06/2019   IR GASTROSTOMY TUBE REMOVAL  10/06/2019   IR IMAGING GUIDED PORT INSERTION  06/06/2019   IR REMOVAL TUN ACCESS W/ PORT W/O FL MOD SED  11/24/2019       Prior to Admission medications   Medication Sig Start Date End Date Taking? Authorizing Provider  LORazepam  (ATIVAN ) 0.5 MG tablet Take 1-2 tablets (0.5-1 mg total) by mouth daily as needed for anxiety (panic). 02/28/24   Jolinda Norene HERO, DO  omeprazole  (PRILOSEC) 20 MG capsule Take 1 capsule (20 mg total) by mouth 2 (two) times daily before a meal. 02/28/24   Jolinda Norene M, DO  tadalafil  (CIALIS ) 5 MG tablet Take 1 tablet (5 mg total) by mouth  daily. 02/28/24   Jolinda Norene HERO, DO    Allergies: Patient has no known allergies.    Review of Systems  Cardiovascular:  Positive for chest pain.    Updated Vital Signs BP 117/69 (BP Location: Left Arm)   Pulse 69   Temp 97.9 F (36.6 C)   Resp 20   SpO2 99%   Physical Exam Vitals and nursing note reviewed.  Constitutional:      General: He is not in acute distress.    Appearance: He is well-developed.  HENT:     Head: Normocephalic and atraumatic.  Eyes:     Conjunctiva/sclera: Conjunctivae normal.  Cardiovascular:     Rate and Rhythm: Normal rate and regular rhythm.     Heart sounds: No murmur heard. Pulmonary:     Effort: Pulmonary effort is normal. No respiratory distress.     Breath sounds: Normal breath sounds.  Abdominal:     Palpations: Abdomen is soft.     Tenderness: There is no abdominal tenderness.  Musculoskeletal:        General: No swelling.     Cervical back: Neck supple.  Skin:    General: Skin is warm and dry.     Capillary Refill: Capillary refill takes less than 2 seconds.  Neurological:     Mental Status: He is alert.  Psychiatric:  Mood and Affect: Mood normal.     (all labs ordered are listed, but only abnormal results are displayed) Labs Reviewed  BASIC METABOLIC PANEL WITH GFR - Abnormal; Notable for the following components:      Result Value   Glucose, Bld 117 (*)    All other components within normal limits  CBC  TROPONIN I (HIGH SENSITIVITY)    EKG: EKG Interpretation Date/Time:  Sunday March 26 2024 12:29:37 EDT Ventricular Rate:  70 PR Interval:  184 QRS Duration:  96 QT Interval:  380 QTC Calculation: 410 R Axis:   -29  Text Interpretation: Normal sinus rhythm with sinus arrhythmia Anterior infarct , age undetermined Abnormal ECG No previous ECGs available Confirmed by Ula Barter [phone]) on 03/26/2024 1:52:27 PM  Radiology: DG Chest 2 View Result Date: 03/26/2024 EXAM: 2 VIEW(S) XRAY OF THE CHEST  03/26/2024 12:53:00 PM COMPARISON: CT 10/16/2019 CLINICAL HISTORY: cp. CP; Pt states he was beign treated for Afib \\T \ NSTEMI yesterday at another hospital but was told they wouldn't have cardiologist until tomorrow so pt left AMA and came here. Still having some chest discomfort. FINDINGS: LUNGS AND PLEURA: No focal pulmonary opacity. No pulmonary edema. No pleural effusion. No pneumothorax. HEART AND MEDIASTINUM: No acute abnormality of the cardiac and mediastinal silhouettes. BONES AND SOFT TISSUES: No acute osseous abnormality. IMPRESSION: 1. No acute cardiopulmonary process detected. Electronically signed by: Waddell Calk MD 03/26/2024 01:01 PM EDT RP Workstation: [id number]     Procedures   Medications Ordered in the ED - No data to display  Clinical Course as of 03/26/24 1505  Sun Mar 26, 2024  1356 Patient with history of hypertension evaluated for complaints of palpitations that started yesterday and being found with new onset A-fib with a transient elevation of his troponins from 40 to 32.  It is unclear whether he was recommended for transfer left AMA.  Regardless he arrives here hemodynamically stable.  Peers to be in sinus rhythm he is asymptomatic.  His lab work is without significant abnormality and his troponin is without elevation.  Will consult cardiology for further evaluation. [JT]  1457 Discussed patient with cardiology, Dr. Kennyth.  He will come evaluate. [JT]    Clinical Course User Index [JT] Donnajean Lynwood DEL, PA-C                                 Medical Decision Making Amount and/or Complexity of Data Reviewed Labs: ordered. Radiology: ordered.   This patient presents to the ED with chief complaint(s) of palpitation.  The complaint involves an extensive differential diagnosis and also carries with it a high risk of complications and morbidity.   Pertinent past medical history as listed in HPI  The differential diagnosis includes  Do not suspect ACS as patient is  without any ischemic changes and troponins without elevation.  Exam and history not consistent with aortic dissection.  No risk factors for PE. Additional history obtained: Additional history obtained from spouse Records reviewed Care Everywhere/External Records  Disposition:   Signout given to Dr. Sharlet Disposition pending cardiology recommendations.  Social Determinants of Health:   none  This note was dictated with voice recognition software.  Despite best efforts at proofreading, errors may have occurred which can change the documentation meaning.       Final diagnoses:  Palpitations    ED Discharge Orders     None  Donnajean Lynwood DEL, PA-C 03/26/24 1505    Ula Prentice SAUNDERS, MD 03/27/24 2352

## 2024-03-26 NOTE — ED Provider Notes (Signed)
  Physical Exam  BP 117/69 (BP Location: Left Arm)   Pulse 69   Temp 97.9 F (36.6 C)   Resp 20   SpO2 99%   Physical Exam  Procedures  Procedures  ED Course / MDM   Clinical Course as of 03/26/24 1521  Sun Mar 26, 2024  1356 Patient with history of hypertension evaluated for complaints of palpitations that started yesterday and being found with new onset A-fib with a transient elevation of his troponins from 40 to 32.  It is unclear whether he was recommended for transfer left AMA.  Regardless he arrives here hemodynamically stable.  Peers to be in sinus rhythm he is asymptomatic.  His lab work is without significant abnormality and his troponin is without elevation.  Will consult cardiology for further evaluation. [JT]  1457 Discussed patient with cardiology, Dr. Kennyth.  He will come evaluate. [JT]  1510 Recent new afib diagnosis. Left AMA. Cards to see, admit vs outpatient echo. CHASVASC 1 [LB]    Clinical Course User Index [JT] Donnajean Lynwood DEL, PA-C [LB] Sharlet Dowdy, MD   Medical Decision Making Amount and/or Complexity of Data Reviewed Labs: ordered. Radiology: ordered.   ***

## 2024-03-26 NOTE — ED Triage Notes (Signed)
 Pt arrives via POV. PT reports he checked himself out from a hospital in [place]. He states he was being treated for a NSTEMI and Afib. PT reports he is still having some chest discomfort. He arrives AxOx4.

## 2024-03-26 NOTE — Consult Note (Signed)
 Cardiology Consultation   Patient ID: Eric Weaver MRN: [id number]; DOB: 02-03-1971  Admit date: 03/26/2024 Date of Consult: 03/26/2024  PCP:  Jolinda Norene HERO, DO   [facility] Providers Cardiologist:  None        History of Present Illness: Eric Weaver is a 53 year old male with past medical history notable for questionable hypertension who presented to the ED today for episode of atrial fibrillation at home.  Patient reports that he drink some bourbon on Friday night.  He awoke on Saturday morning, had a cup of coffee, and went to the gym per his usual weekend routine.  He began exercising without difficulty, but at some point developed palpitations.  His watch notified him of elevated heart rates and atrial fibrillation.  He initially waited to see if this would resolve on its own but it persisted and he eventually went to the ED for evaluation.  In his local ED, he had a ECG consistent with atrial fibrillation.  His troponins were slightly abnormal but downtrending.  He was being set up for echocardiogram and possible cardioversion, but was told there would not be a cardiologist to see him until Monday, so he left the hospital on his own.  Shortly after leaving, he states that he converted to normal rhythm, which was again confirmed on his watch.  He then presented to the [facility] ED for further evaluation.  He denies any history of chest pain.  He states that he exercises regularly without any chest discomfort.  While in atrial fibrillation, he had no syncope or shortness of breath.  His primary symptom appeared to be palpitations. Currently, he reports feeling relatively well.  He has no new or acute complaints.   Past Medical History:  Diagnosis Date   Depression    Essential hypertension 08/02/2018   GERD (gastroesophageal reflux disease)    History of nephrolithiasis    Vitamin D  deficiency     Past Surgical History:  Procedure Laterality Date   Biopsy of right  tonsil Right 04/24/2019   Positive for squamous cell carcinoma   IR GASTROSTOMY TUBE MOD SED  06/06/2019   IR GASTROSTOMY TUBE REMOVAL  10/06/2019   IR IMAGING GUIDED PORT INSERTION  06/06/2019   IR REMOVAL TUN ACCESS W/ PORT W/O FL MOD SED  11/24/2019     Allergies:   No Known Allergies  Social History:   Social History   Socioeconomic History   Marital status: Married    Spouse name: Not on file   Number of children: Not on file   Years of education: Not on file   Highest education level: Associate degree: occupational, scientist, product/process development, or vocational program  Occupational History   Not on file  Tobacco Use   Smoking status: Never   Smokeless tobacco: Never  Vaping Use   Vaping status: Never Used  Substance and Sexual Activity   Alcohol use: Yes    Comment: occasional   Drug use: No   Sexual activity: Not Currently  Other Topics Concern   Not on file  Social History Narrative   Not on file   Social Drivers of Health   Financial Resource Strain: Low Risk  (02/24/2023)   Overall Financial Resource Strain (CARDIA)    Difficulty of Paying Living Expenses: Not very hard  Food Insecurity: No Food Insecurity (02/24/2023)   Hunger Vital Sign    Worried About Running Out of Food in the Last Year: Never true    Ran Out of Food in  the Last Year: Never true  Transportation Needs: No Transportation Needs (02/24/2023)   PRAPARE - Administrator, Civil Service (Medical): No    Lack of Transportation (Non-Medical): No  Physical Activity: Sufficiently Active (02/24/2023)   Exercise Vital Sign    Days of Exercise per Week: 4 days    Minutes of Exercise per Session: 60 min  Stress: Stress Concern Present (02/24/2023)   [employer] of Occupational Health - Occupational Stress Questionnaire    Feeling of Stress : To some extent  Social Connections: Moderately Isolated (02/24/2023)   Social Connection and Isolation Panel    Frequency of Communication with Friends and Family: Once a  week    Frequency of Social Gatherings with Friends and Family: Never    Attends Religious Services: 1 to 4 times per year    Active Member of [employer] or Organizations: No    Attends Engineer, Structural: Not on file    Marital Status: Married  Catering Manager Violence: Not on file    Family History:    Family History  Problem Relation Age of Onset   Hypertension Mother    Heart attack Father      ROS:  Please see the history of present illness.   All other ROS reviewed and negative.     Physical Exam/Data: Vitals:   03/26/24 1209 03/26/24 1529 03/26/24 1530 03/26/24 1531  BP: 117/69  128/88   Pulse: 69  61   Resp: 20  15   Temp: 97.9 F (36.6 C) 98.1 F (36.7 C)    TempSrc:  Oral    SpO2: 99%  100%   Weight:    125 kg  Height:    6' (1.829 m)   No intake or output data in the 24 hours ending 03/26/24 1742    03/26/2024    3:31 PM 02/28/2024    3:35 PM 02/24/2023    3:41 PM  Last 3 Weights  Weight (lbs) 275 lb 9.2 oz 276 lb 8 oz 294 lb 6.4 oz  Weight (kg) 125 kg 125.42 kg 133.539 kg     Body mass index is 37.37 kg/m.   General: Well developed, in no acute distress.  Neck: No JVD.  Cardiac: Normal rate, regular rhythm.  Resp: Normal work of breathing.  Ext: No edema.  Neuro: No gross focal deficits.  Psych: Normal affect.   EKG:  The EKG was personally reviewed and demonstrates: Sinus rhythm Telemetry:  Telemetry was personally reviewed and demonstrates: Sinus rhythm  Laboratory Data: High Sensitivity Troponin:   Recent Labs  Lab 03/26/24 1229  TROPONINIHS 9     Chemistry Recent Labs  Lab 03/26/24 1229  NA 137  K 4.2  CL 107  CO2 23  GLUCOSE 117*  BUN 16  CREATININE 1.01  CALCIUM 9.3  GFRNONAA >60  ANIONGAP 7    No results for input(s): PROT, ALBUMIN, AST, ALT, ALKPHOS, BILITOT in the last 168 hours. Lipids No results for input(s): CHOL, TRIG, HDL, LABVLDL, LDLCALC, CHOLHDL in the last 168 hours.   Hematology Recent Labs  Lab 03/26/24 1229  WBC 9.6  RBC 5.25  HGB 15.7  HCT 46.4  MCV 88.4  MCH 29.9  MCHC 33.8  RDW 13.0  PLT 294   Thyroid  No results for input(s): TSH, FREET4 in the last 168 hours.  BNPNo results for input(s): BNP, PROBNP in the last 168 hours.  DDimer No results for input(s): DDIMER in the last 168 hours.  Radiology/Studies:  DG Chest 2 View Result Date: 03/26/2024 EXAM: 2 VIEW(S) XRAY OF THE CHEST 03/26/2024 12:53:00 PM COMPARISON: CT 10/16/2019 CLINICAL HISTORY: cp. CP; Pt states he was beign treated for Afib \\T \ NSTEMI yesterday at another hospital but was told they wouldn't have cardiologist until tomorrow so pt left [facility] and came here. Still having some chest discomfort. FINDINGS: LUNGS AND PLEURA: No focal pulmonary opacity. No pulmonary edema. No pleural effusion. No pneumothorax. HEART AND MEDIASTINUM: No acute abnormality of the cardiac and mediastinal silhouettes. BONES AND SOFT TISSUES: No acute osseous abnormality. IMPRESSION: 1. No acute cardiopulmonary process detected. Electronically signed by: Waddell Calk MD 03/26/2024 01:01 PM EDT RP Workstation: [id number]     Assessment and Plan: Mr. Claunch is a 53 year old male with past medical history of questionable hypertension who presented to the ED today for evaluation following an episode of atrial fibrillation prior to ED visit.  Atrial fibrillation possibly triggered in setting of alcohol, caffeine and dehydration.  He has had similar episodes of palpitations previously but these only lasted for 2 to 3 minutes at a time.  This most recent episode lasted for over 20 hours.  He did spontaneously convert.  He was seen at an his local ED and found to have slightly elevated but downtrending troponins.  He denied any history of chest pain.  He exercises regularly and has not had any angina or anginal equivalents. His high-sensitivity troponins are normal here.  I do not suspect ACS.  Elevation of  troponins was likely demand ischemia in setting of atrial fibrillation with rapid rates.   # Paroxysmal atrial fibrillation: Symptomatic.  Only 1 known episode.  I reviewed his watch tracings and ECG strips and confirmed presence of atrial fibrillation. - Given 1 episode that was possibly triggered by alcohol, caffeine and dehydration, we will take a watchful waiting approach for now.  Will give him as needed metoprolol as needed for elevated heart rates.  I encouraged abstinence from alcohol to reduce chance of recurrence. We discussed likelihood of AF recurrence in general as well as long-term strategies such as antiarrhythmic drug therapy and catheter ablation.  Patient will follow-up with me in clinic.   - We will order echocardiogram and coronary CTA to be done as outpatient. - Continue monitoring for recurrence with wearable device.  # Hypercoagulable state due to atrial fibrillation: CHA2DS2-VASc score of 1.  Discussed risk and benefits of anticoagulation for stroke prophylaxis.  Will remain off anticoagulation at this time.  Signed, Fonda Kitty, MD  03/26/2024 5:42 PM

## 2024-03-28 ENCOUNTER — Telehealth: Payer: Self-pay

## 2024-03-28 DIAGNOSIS — I480 Paroxysmal atrial fibrillation: Secondary | ICD-10-CM

## 2024-03-28 NOTE — Telephone Encounter (Signed)
 Orders have been placed. Will schedule office visit after testing.

## 2024-03-28 NOTE — Telephone Encounter (Signed)
-----   Message from Fonda Kitty sent at 03/26/2024  5:52 PM EDT ----- Regarding: Follow up I saw this patient in the ED over the weekend.  I would like to have him see me in clinic sometime in the next month.  He needs an echocardiogram and coronary CTA prior to clinic visit.  Can you please assist with arranging these things?  Thanks, [employer]

## 2024-03-29 NOTE — Telephone Encounter (Signed)
 Echo and CT have been scheduled for 11/25.  Called patient and left message to call back to schedule an office visit with Dr. Kennyth in December.

## 2024-04-04 NOTE — Telephone Encounter (Signed)
 Call x1; lvmtcb to schedule appointment w/ JP for f/u from October hospitalization and from CT/echo ordered by Dr. Kennyth.

## 2024-04-25 ENCOUNTER — Ambulatory Visit (HOSPITAL_COMMUNITY): Admission: RE | Admit: 2024-04-25 | Source: Ambulatory Visit | Attending: Cardiology | Admitting: Cardiology

## 2024-04-25 ENCOUNTER — Ambulatory Visit (HOSPITAL_COMMUNITY)

## 2024-05-12 ENCOUNTER — Ambulatory Visit: Admitting: Cardiology

## 2024-06-07 ENCOUNTER — Encounter (HOSPITAL_COMMUNITY): Payer: Self-pay

## 2024-06-09 ENCOUNTER — Ambulatory Visit (HOSPITAL_COMMUNITY)
Admission: RE | Admit: 2024-06-09 | Discharge: 2024-06-09 | Disposition: A | Source: Ambulatory Visit | Attending: Cardiology | Admitting: Cardiology

## 2024-06-09 ENCOUNTER — Ambulatory Visit (HOSPITAL_COMMUNITY)
Admission: RE | Admit: 2024-06-09 | Discharge: 2024-06-09 | Disposition: A | Source: Ambulatory Visit | Attending: Cardiology

## 2024-06-09 DIAGNOSIS — I480 Paroxysmal atrial fibrillation: Secondary | ICD-10-CM

## 2024-06-09 DIAGNOSIS — I4891 Unspecified atrial fibrillation: Secondary | ICD-10-CM

## 2024-06-09 LAB — ECHOCARDIOGRAM COMPLETE: S' Lateral: 3.02 cm

## 2024-06-09 MED ORDER — IOHEXOL 350 MG/ML SOLN
100.0000 mL | Freq: Once | INTRAVENOUS | Status: AC | PRN
  Administered 2024-06-09: 100 mL via INTRAVENOUS

## 2024-06-09 MED ORDER — NITROGLYCERIN 0.4 MG SL SUBL
0.8000 mg | SUBLINGUAL_TABLET | Freq: Once | SUBLINGUAL | Status: AC
  Administered 2024-06-09: 0.8 mg via SUBLINGUAL

## 2024-06-09 MED ORDER — NITROGLYCERIN 0.4 MG SL SUBL
SUBLINGUAL_TABLET | SUBLINGUAL | Status: AC
  Filled 2024-06-09: qty 2

## 2024-06-09 MED ORDER — METOPROLOL TARTRATE 5 MG/5ML IV SOLN
INTRAVENOUS | Status: AC
  Filled 2024-06-09: qty 5

## 2024-06-11 ENCOUNTER — Ambulatory Visit: Payer: Self-pay | Admitting: Cardiology

## 2024-06-11 DIAGNOSIS — I77810 Thoracic aortic ectasia: Secondary | ICD-10-CM

## 2024-06-17 ENCOUNTER — Emergency Department (HOSPITAL_COMMUNITY): Admission: EM | Admit: 2024-06-17 | Discharge: 2024-06-17 | Disposition: A | Discharge status: Home / Self Care

## 2024-06-17 ENCOUNTER — Emergency Department (HOSPITAL_COMMUNITY)

## 2024-06-17 DIAGNOSIS — R109 Unspecified abdominal pain: Secondary | ICD-10-CM | POA: Diagnosis present

## 2024-06-17 DIAGNOSIS — N132 Hydronephrosis with renal and ureteral calculous obstruction: Secondary | ICD-10-CM | POA: Insufficient documentation

## 2024-06-17 DIAGNOSIS — N200 Calculus of kidney: Secondary | ICD-10-CM

## 2024-06-17 LAB — BASIC METABOLIC PANEL WITH GFR
Anion gap: 14 (ref 5–15)
BUN: 14 mg/dL (ref 6–20)
CO2: 24 mmol/L (ref 22–32)
Calcium: 9.3 mg/dL (ref 8.9–10.3)
Chloride: 101 mmol/L (ref 98–111)
Creatinine, Ser: 1.18 mg/dL (ref 0.61–1.24)
GFR, Estimated: >60 mL/min
Glucose, Bld: 160 mg/dL — ABNORMAL HIGH (ref 70–99)
Potassium: 4.3 mmol/L (ref 3.5–5.1)
Sodium: 140 mmol/L (ref 135–145)

## 2024-06-17 LAB — URINALYSIS, ROUTINE W REFLEX MICROSCOPIC
Bacteria, UA: NONE SEEN
Bilirubin Urine: NEGATIVE
Glucose, UA: NEGATIVE mg/dL
Ketones, ur: NEGATIVE mg/dL
Leukocytes,Ua: NEGATIVE
Nitrite: NEGATIVE
Protein, ur: NEGATIVE mg/dL
RBC / HPF: >50 RBC/hpf (ref 0–5)
Specific Gravity, Urine: 1.024 (ref 1.005–1.030)
pH: 6 (ref 5.0–8.0)

## 2024-06-17 LAB — CBC WITH DIFFERENTIAL/PLATELET
Abs Immature Granulocytes: 0.09 K/uL — ABNORMAL HIGH (ref 0.00–0.07)
Basophils Absolute: 0 K/uL (ref 0.0–0.1)
Basophils Relative: 0 %
Eosinophils Absolute: 0 K/uL (ref 0.0–0.5)
Eosinophils Relative: 0 %
HCT: 43.3 % (ref 39.0–52.0)
Hemoglobin: 14.8 g/dL (ref 13.0–17.0)
Immature Granulocytes: 1 %
Lymphocytes Relative: 3 %
Lymphs Abs: 0.4 K/uL — ABNORMAL LOW (ref 0.7–4.0)
MCH: 29.8 pg (ref 26.0–34.0)
MCHC: 34.2 g/dL (ref 30.0–36.0)
MCV: 87.1 fL (ref 80.0–100.0)
Monocytes Absolute: 0.3 K/uL (ref 0.1–1.0)
Monocytes Relative: 2 %
Neutro Abs: 14 K/uL — ABNORMAL HIGH (ref 1.7–7.7)
Neutrophils Relative %: 94 %
Platelets: 273 K/uL (ref 150–400)
RBC: 4.97 MIL/uL (ref 4.22–5.81)
RDW: 13.3 % (ref 11.5–15.5)
WBC: 14.8 K/uL — ABNORMAL HIGH (ref 4.0–10.5)
nRBC: 0 % (ref 0.0–0.2)

## 2024-06-17 MED ORDER — ONDANSETRON HCL 4 MG PO TABS: 4.0000 mg | ORAL_TABLET | Freq: Three times a day (TID) | ORAL | 0 refills | Status: AC | PRN

## 2024-06-17 MED ORDER — ONDANSETRON HCL 4 MG/2ML IJ SOLN
4.0000 mg | Freq: Once | INTRAMUSCULAR | Status: AC
  Administered 2024-06-17: 4 mg via INTRAVENOUS
  Filled 2024-06-17: qty 2

## 2024-06-17 MED ORDER — HYDROMORPHONE HCL 1 MG/ML IJ SOLN
0.5000 mg | Freq: Once | INTRAMUSCULAR | Status: AC
  Administered 2024-06-17: 0.5 mg via INTRAVENOUS
  Filled 2024-06-17: qty 0.5

## 2024-06-17 MED ORDER — KETOROLAC TROMETHAMINE 15 MG/ML IJ SOLN
15.0000 mg | Freq: Once | INTRAMUSCULAR | Status: AC
  Administered 2024-06-17: 15 mg via INTRAVENOUS
  Filled 2024-06-17: qty 1

## 2024-06-17 MED ORDER — OXYCODONE HCL 5 MG PO TABS: 5.0000 mg | ORAL_TABLET | Freq: Four times a day (QID) | ORAL | 0 refills | Status: AC | PRN

## 2024-06-17 MED ORDER — TAMSULOSIN HCL 0.4 MG PO CAPS: 0.4000 mg | ORAL_CAPSULE | Freq: Every day | ORAL | 0 refills | Status: AC

## 2024-06-17 MED ORDER — LACTATED RINGERS IV BOLUS
1000.0000 mL | Freq: Once | INTRAVENOUS | Status: AC
  Administered 2024-06-17: 1000 mL via INTRAVENOUS

## 2024-06-17 NOTE — Discharge Instructions (Addendum)
 Call and follow-up with your urologist.  You can use your pain medication as prescribed.  Take Zofran  as needed for nausea and vomiting and take your Flomax  daily.  You should alternate Tylenol  Motrin every 3 hours as needed for pain.  Return to the ER for new or worsening symptoms.

## 2024-06-17 NOTE — ED Triage Notes (Signed)
 Pt states that around midnight he was awoken with L sided flank pain. Reports hx of kidney stones and pain feels similar. Has had 4 episodes of N/V that he thinks is related to the pain. Pt also reports only urinating one time since then and feeling as though he needs to void but unable to do so fully.

## 2024-06-17 NOTE — ED Provider Notes (Signed)
 " [place] [facility]ENC[facility] DEPARTMENT AT [facility] Provider Note   CSN: [id number] Arrival date & time: 06/17/24  9240     Patient presents with: Flank Pain   Eric Weaver is a 54 y.o. male.   54 year old male presents since for evaluation of left flank pain.  States this started around midnight last night.  States he has a history of kidney stones and this feels similar.  Admits to some nausea and has vomited 4 times.  He also states that he has had some difficulty urinating today.  Denies any other symptoms or concerns.   Flank Pain Pertinent negatives include no chest pain, no abdominal pain and no shortness of breath.       Prior to Admission medications  Medication Sig Start Date End Date Taking? Authorizing Provider  ondansetron  (ZOFRAN ) 4 MG tablet Take 1 tablet (4 mg total) by mouth every 8 (eight) hours as needed for up to 4 days. 06/17/24 06/21/24 Yes Bernese Doffing L, DO  oxyCODONE  (ROXICODONE ) 5 MG immediate release tablet Take 1 tablet (5 mg total) by mouth every 6 (six) hours as needed for up to 4 days for severe pain (pain score 7-10). 06/17/24 06/21/24 Yes Remmy Crass L, DO  tamsulosin  (FLOMAX ) 0.4 MG CAPS capsule Take 1 capsule (0.4 mg total) by mouth daily for 14 days. 06/17/24 07/01/24 Yes Shavonte Zhao L, DO  LORazepam  (ATIVAN ) 0.5 MG tablet Take 1-2 tablets (0.5-1 mg total) by mouth daily as needed for anxiety (panic). 02/28/24   Jolinda Norene HERO, DO  metoprolol  tartrate (LOPRESSOR ) 25 MG tablet Take 1 tablet (25 mg total) by mouth 3 (three) times daily as needed (for HR greater thant 100). 03/26/24 04/25/24  Sharlet Dowdy, MD  omeprazole  (PRILOSEC) 20 MG capsule Take 1 capsule (20 mg total) by mouth 2 (two) times daily before a meal. 02/28/24   Gottschalk, Norene M, DO  tadalafil  (CIALIS ) 5 MG tablet Take 1 tablet (5 mg total) by mouth daily. 02/28/24   Jolinda Norene HERO, DO    Allergies: Patient has no known allergies.    Review of Systems   Constitutional:  Negative for chills and fever.  HENT:  Negative for ear pain and sore throat.   Eyes:  Negative for pain and visual disturbance.  Respiratory:  Negative for cough and shortness of breath.   Cardiovascular:  Negative for chest pain and palpitations.  Gastrointestinal:  Positive for nausea and vomiting. Negative for abdominal pain.  Genitourinary:  Positive for flank pain. Negative for dysuria and hematuria.  Musculoskeletal:  Negative for arthralgias and back pain.  Skin:  Negative for color change and rash.  Neurological:  Negative for seizures and syncope.  All other systems reviewed and are negative.   Updated Vital Signs BP 130/73   Pulse 82   Temp 97.7 F (36.5 C) (Oral)   Resp 18   SpO2 96%   Physical Exam Vitals and nursing note reviewed.  Constitutional:      General: He is not in acute distress.    Appearance: Normal appearance. He is well-developed. He is not ill-appearing.  HENT:     Head: Normocephalic and atraumatic.  Eyes:     Conjunctiva/sclera: Conjunctivae normal.  Cardiovascular:     Rate and Rhythm: Normal rate and regular rhythm.     Heart sounds: No murmur heard. Pulmonary:     Effort: Pulmonary effort is normal. No respiratory distress.     Breath sounds: Normal breath sounds.  Abdominal:  Palpations: Abdomen is soft.     Tenderness: There is no abdominal tenderness. There is left CVA tenderness.  Musculoskeletal:        General: No swelling.     Cervical back: Neck supple.  Skin:    General: Skin is warm and dry.     Capillary Refill: Capillary refill takes less than 2 seconds.  Neurological:     Mental Status: He is alert.  Psychiatric:        Mood and Affect: Mood normal.     (all labs ordered are listed, but only abnormal results are displayed) Labs Reviewed  BASIC METABOLIC PANEL WITH GFR - Abnormal; Notable for the following components:      Result Value   Glucose, Bld 160 (*)    All other components within  normal limits  CBC WITH DIFFERENTIAL/PLATELET - Abnormal; Notable for the following components:   WBC 14.8 (*)    Neutro Abs 14.0 (*)    Lymphs Abs 0.4 (*)    Abs Immature Granulocytes 0.09 (*)    All other components within normal limits  URINALYSIS, ROUTINE W REFLEX MICROSCOPIC - Abnormal; Notable for the following components:   Hgb urine dipstick SMALL (*)    All other components within normal limits    EKG: None  Radiology: CT Renal Stone Study Result Date: 06/17/2024 EXAM: CT ABDOMEN AND PELVIS WITHOUT CONTRAST 06/17/2024 08:39:24 AM TECHNIQUE: CT of the abdomen and pelvis was performed without the administration of intravenous contrast. Multiplanar reformatted images are provided for review. Automated exposure control, iterative reconstruction, and/or weight-based adjustment of the mA/kV was utilized to reduce the radiation dose to as low as reasonably achievable. COMPARISON: 07/09/2018 CLINICAL HISTORY: left flank pain FINDINGS: LOWER CHEST: Dependent atelectasis is noted within the lung bases. LIVER: Hepatic steatosis is present. GALLBLADDER AND BILE DUCTS: Gallbladder is unremarkable. No biliary ductal dilatation. SPLEEN: No acute abnormality. PANCREAS: No acute abnormality. ADRENAL GLANDS: Left adrenal nodule measures 1.5 cm and 10 Hounsfield units, image 28/2. This is compatible with a benign nodule, likely adenoma. No follow-up imaging recommended. KIDNEYS, URETERS AND BLADDER: Bilateral kidney stones are present. The largest right kidney stone is in the upper pole, measuring 4 mm. The largest left kidney stone is in the upper pole, measuring 4 mm. Asymmetric left-sided perinephric fat stranding and mild left hydronephrosis are noted. There is left-sided periureteral soft tissue stranding and mild left hydroureter. A 4 mm stone is identified in the left ureterovesical junction (UVJ), image 86/2. The urinary bladder is unremarkable. GI AND BOWEL: Stomach demonstrates no acute abnormality.  There is no bowel obstruction. PERITONEUM AND RETROPERITONEUM: No ascites. No free air. VASCULATURE: Aorta is normal in caliber. LYMPH NODES: No lymphadenopathy. REPRODUCTIVE ORGANS: The prostate gland appears normal. BONES AND SOFT TISSUES: No acute osseous abnormality. No focal soft tissue abnormality. IMPRESSION: 1. Left UVJ 4 mm obstructing calculus with mild left hydroureteronephrosis and perinephric/periureteral stranding, most consistent with acute obstructive uropathy. 2. Additional bilateral nonobstructing nephrolithiasis, with the largest stones measuring 4 mm in the upper poles. Electronically signed by: Waddell Calk MD 06/17/2024 08:51 AM EST RP Workstation: [id number]     Procedures   Medications Ordered in the ED  lactated ringers  bolus 1,000 mL (0 mLs Intravenous Stopped 06/17/24 1101)  ondansetron  (ZOFRAN ) injection 4 mg (4 mg Intravenous Given 06/17/24 0829)  ketorolac  (TORADOL ) 15 MG/ML injection 15 mg (15 mg Intravenous Given 06/17/24 0830)  HYDROmorphone  (DILAUDID ) injection 0.5 mg (0.5 mg Intravenous Given 06/17/24 0831)  HYDROmorphone  (DILAUDID ) injection  0.5 mg (0.5 mg Intravenous Given 06/17/24 1120)  ondansetron  (ZOFRAN ) injection 4 mg (4 mg Intravenous Given 06/17/24 1120)                                    Medical Decision Making Patient here for kidney stone.  Has left 4 mm kidney stone with some stranding.  No evidence of kidney dysfunction or urinary tract infection.  Pain better after Dilaudid  Zofran  IV fluids.  Will start him on oxycodone  and Flomax  and Zofran  and he will plan to follow-up with his urologist.  Myles return for new or 6 dose.  All results and plan discussed with patient wife at bedside.  They feel comfortable with the plan for him to be discharged.  Problems Addressed: Kidney stone: acute illness or injury  Amount and/or Complexity of Data Reviewed External Data Reviewed: notes.    Details: Prior ED records reviewed and was seen for palpitations  on 03/26/2024 Labs: ordered. Decision-making details documented in ED Course.    Details: Ordered and reviewed by me and shows no acute abnormality  Radiology: ordered and independent interpretation performed. Decision-making details documented in ED Course.    Details: Ordered and interpreted by me independently of radiology CT renal stone: shows evidence of kidney stone on the left, 4mm with some stranding   Risk OTC drugs. Prescription drug management. Parenteral controlled substances. Drug therapy requiring intensive monitoring for toxicity.     Final diagnoses:  Kidney stone    ED Discharge Orders          Ordered    oxyCODONE  (ROXICODONE ) 5 MG immediate release tablet  Every 6 hours PRN        06/17/24 1158    tamsulosin  (FLOMAX ) 0.4 MG CAPS capsule  Daily        06/17/24 1158    ondansetron  (ZOFRAN ) 4 MG tablet  Every 8 hours PRN        06/17/24 1158               Damyah Gugel L, DO 06/17/24 1426  "

## 2024-06-29 ENCOUNTER — Ambulatory Visit: Admitting: Cardiology

## 2024-08-25 ENCOUNTER — Ambulatory Visit: Payer: Self-pay | Admitting: Family Medicine

## 2024-09-12 ENCOUNTER — Ambulatory Visit: Admitting: Cardiology

## 2025-02-28 ENCOUNTER — Encounter: Payer: Self-pay | Admitting: Family Medicine
